# Patient Record
Sex: Male | Born: 1945
Health system: Southern US, Community
[De-identification: ages and names within clinical notes are randomized; demographics above are authoritative.]

## PROBLEM LIST (undated history)

## (undated) DIAGNOSIS — K219 Gastro-esophageal reflux disease without esophagitis: Secondary | ICD-10-CM

## (undated) DIAGNOSIS — G4700 Insomnia, unspecified: Secondary | ICD-10-CM

## (undated) DIAGNOSIS — T8859XA Other complications of anesthesia, initial encounter: Secondary | ICD-10-CM

## (undated) DIAGNOSIS — I2510 Atherosclerotic heart disease of native coronary artery without angina pectoris: Secondary | ICD-10-CM

## (undated) DIAGNOSIS — T4145XA Adverse effect of unspecified anesthetic, initial encounter: Secondary | ICD-10-CM

## (undated) DIAGNOSIS — IMO0002 Reserved for concepts with insufficient information to code with codable children: Secondary | ICD-10-CM

## (undated) DIAGNOSIS — J45909 Unspecified asthma, uncomplicated: Secondary | ICD-10-CM

## (undated) DIAGNOSIS — M1990 Unspecified osteoarthritis, unspecified site: Secondary | ICD-10-CM

## (undated) DIAGNOSIS — E119 Type 2 diabetes mellitus without complications: Secondary | ICD-10-CM

## (undated) DIAGNOSIS — R29898 Other symptoms and signs involving the musculoskeletal system: Secondary | ICD-10-CM

## (undated) DIAGNOSIS — D369 Benign neoplasm, unspecified site: Secondary | ICD-10-CM

## (undated) DIAGNOSIS — T884XXA Failed or difficult intubation, initial encounter: Secondary | ICD-10-CM

## (undated) DIAGNOSIS — R7303 Prediabetes: Secondary | ICD-10-CM

## (undated) DIAGNOSIS — C4491 Basal cell carcinoma of skin, unspecified: Secondary | ICD-10-CM

## (undated) DIAGNOSIS — T7840XA Allergy, unspecified, initial encounter: Secondary | ICD-10-CM

## (undated) DIAGNOSIS — I10 Essential (primary) hypertension: Secondary | ICD-10-CM

## (undated) DIAGNOSIS — E7800 Pure hypercholesterolemia, unspecified: Secondary | ICD-10-CM

## (undated) HISTORY — DX: Unspecified osteoarthritis, unspecified site: M19.90

## (undated) HISTORY — PX: BASAL CELL CARCINOMA EXCISION: SHX1214

## (undated) HISTORY — DX: Allergy, unspecified, initial encounter: T78.40XA

## (undated) HISTORY — PX: CARDIAC CATHETERIZATION: SHX172

## (undated) HISTORY — PX: COLONOSCOPY: SHX174

## (undated) HISTORY — PX: SQUAMOUS CELL CARCINOMA EXCISION: SHX2433

## (undated) HISTORY — PX: JOINT REPLACEMENT: SHX530

## (undated) HISTORY — DX: Benign neoplasm, unspecified site: D36.9

## (undated) HISTORY — PX: EYE SURGERY: SHX253

## (undated) HISTORY — PX: KNEE ARTHROSCOPY: SHX127

## (undated) HISTORY — DX: Atherosclerotic heart disease of native coronary artery without angina pectoris: I25.10

---

## 1997-10-30 HISTORY — PX: SALIVARY GLAND SURGERY: SHX768

## 1998-08-24 ENCOUNTER — Encounter: Admission: RE | Admit: 1998-08-24 | Discharge: 1998-08-24 | Payer: Self-pay | Admitting: Sports Medicine

## 1998-08-27 ENCOUNTER — Encounter: Admission: RE | Admit: 1998-08-27 | Discharge: 1998-08-27 | Payer: Self-pay | Admitting: Sports Medicine

## 1998-11-08 ENCOUNTER — Encounter: Admission: RE | Admit: 1998-11-08 | Discharge: 1998-11-08 | Payer: Self-pay | Admitting: Sports Medicine

## 1999-07-01 HISTORY — PX: BLEPHAROPLASTY: SUR158

## 2001-05-14 ENCOUNTER — Encounter: Payer: Self-pay | Admitting: *Deleted

## 2001-05-14 ENCOUNTER — Ambulatory Visit (HOSPITAL_COMMUNITY): Admission: RE | Admit: 2001-05-14 | Discharge: 2001-05-14 | Payer: Self-pay | Admitting: *Deleted

## 2001-06-03 ENCOUNTER — Inpatient Hospital Stay (HOSPITAL_COMMUNITY): Admission: RE | Admit: 2001-06-03 | Discharge: 2001-06-07 | Payer: Self-pay | Admitting: Orthopedic Surgery

## 2001-06-03 ENCOUNTER — Encounter: Payer: Self-pay | Admitting: Orthopedic Surgery

## 2002-10-30 HISTORY — PX: TOTAL HIP ARTHROPLASTY: SHX124

## 2004-10-30 DIAGNOSIS — D369 Benign neoplasm, unspecified site: Secondary | ICD-10-CM

## 2004-10-30 HISTORY — DX: Benign neoplasm, unspecified site: D36.9

## 2005-06-30 ENCOUNTER — Ambulatory Visit: Payer: Self-pay | Admitting: Sports Medicine

## 2005-07-05 ENCOUNTER — Encounter (INDEPENDENT_AMBULATORY_CARE_PROVIDER_SITE_OTHER): Payer: Self-pay | Admitting: Specialist

## 2005-07-05 ENCOUNTER — Ambulatory Visit: Payer: Self-pay | Admitting: Internal Medicine

## 2006-01-30 ENCOUNTER — Ambulatory Visit: Payer: Self-pay | Admitting: Cardiology

## 2006-01-31 ENCOUNTER — Ambulatory Visit: Payer: Self-pay | Admitting: Cardiology

## 2006-02-01 ENCOUNTER — Ambulatory Visit (HOSPITAL_COMMUNITY): Admission: RE | Admit: 2006-02-01 | Discharge: 2006-02-01 | Payer: Self-pay | Admitting: Cardiology

## 2006-02-01 ENCOUNTER — Ambulatory Visit: Payer: Self-pay | Admitting: Cardiology

## 2006-02-07 ENCOUNTER — Ambulatory Visit: Payer: Self-pay | Admitting: Cardiology

## 2006-03-30 ENCOUNTER — Ambulatory Visit: Payer: Self-pay | Admitting: Sports Medicine

## 2007-03-13 ENCOUNTER — Ambulatory Visit: Payer: Self-pay | Admitting: Cardiology

## 2009-08-27 ENCOUNTER — Encounter: Payer: Self-pay | Admitting: Cardiology

## 2009-10-02 ENCOUNTER — Encounter: Payer: Self-pay | Admitting: Cardiology

## 2009-10-15 DIAGNOSIS — I2510 Atherosclerotic heart disease of native coronary artery without angina pectoris: Secondary | ICD-10-CM | POA: Insufficient documentation

## 2009-10-15 DIAGNOSIS — E785 Hyperlipidemia, unspecified: Secondary | ICD-10-CM | POA: Insufficient documentation

## 2009-10-15 DIAGNOSIS — E119 Type 2 diabetes mellitus without complications: Secondary | ICD-10-CM | POA: Insufficient documentation

## 2009-10-19 ENCOUNTER — Ambulatory Visit: Payer: Self-pay | Admitting: Cardiology

## 2009-10-19 DIAGNOSIS — I10 Essential (primary) hypertension: Secondary | ICD-10-CM | POA: Insufficient documentation

## 2010-06-07 ENCOUNTER — Encounter (INDEPENDENT_AMBULATORY_CARE_PROVIDER_SITE_OTHER): Payer: Self-pay | Admitting: *Deleted

## 2010-07-18 ENCOUNTER — Ambulatory Visit: Payer: Self-pay | Admitting: Family Medicine

## 2010-07-18 ENCOUNTER — Inpatient Hospital Stay (HOSPITAL_COMMUNITY): Admission: EM | Admit: 2010-07-18 | Discharge: 2010-07-19 | Payer: Self-pay | Admitting: Emergency Medicine

## 2010-07-25 ENCOUNTER — Encounter (INDEPENDENT_AMBULATORY_CARE_PROVIDER_SITE_OTHER): Payer: Self-pay | Admitting: *Deleted

## 2010-07-28 ENCOUNTER — Ambulatory Visit: Payer: Self-pay | Admitting: Internal Medicine

## 2010-08-04 ENCOUNTER — Ambulatory Visit: Payer: Self-pay | Admitting: Internal Medicine

## 2010-10-19 ENCOUNTER — Ambulatory Visit
Admission: RE | Admit: 2010-10-19 | Discharge: 2010-10-19 | Payer: Self-pay | Source: Home / Self Care | Attending: Orthopedic Surgery | Admitting: Orthopedic Surgery

## 2010-11-29 NOTE — Procedures (Signed)
Summary: Colonoscopy  Patient: [name] Note: All result statuses are Final unless otherwise noted.  Tests: (1) Colonoscopy (COL)   COL Colonoscopy           DONE     [facility]     [address]     [place], [place]  [address]           COLONOSCOPY PROCEDURE REPORT           PATIENT:  Kyle [name], Kyle [name]  MR#:  [id number]     BIRTHDATE:  06-10-1946, 64 yrs. old  GENDER:  male     ENDOSCOPIST:  Hedwig Morton. Juanda Chance, MD     REF. BY:  Lesle Roderic, M.D.     PROCEDURE DATE:  08/04/2010     PROCEDURE:  Colonoscopy 19147     ASA CLASS:  Class I     INDICATIONS:  Elevated Risk Screening tubular adenoma in 2006     MEDICATIONS:   Versed 10 mg, Fentanyl 100 mcg           DESCRIPTION OF PROCEDURE:   After the risks benefits and     alternatives of the procedure were thoroughly explained, informed     consent was obtained.  Digital rectal exam was performed and     revealed no rectal masses.   The  endoscope was introduced through     the anus and advanced to the cecum, which was identified by both     the appendix and ileocecal valve, without limitations.  The     quality of the prep was good, using MiraLax.  The instrument was     then slowly withdrawn as the colon was fully examined.     <<PROCEDUREIMAGES>>           FINDINGS:  No polyps or cancers were seen (see image1, image2,     image3, and image4).   Retroflexed views in the rectum revealed no     abnormalities.    The scope was then withdrawn from the patient     and the procedure completed.           COMPLICATIONS:  None     ENDOSCOPIC IMPRESSION:     1) No polyps or cancers     2) Normal colonoscopy     RECOMMENDATIONS:     1) high fiber diet     REPEAT EXAM:  In 10 year(s) for.           ______________________________     Hedwig Morton. Juanda Chance, MD           CC:           n.     eSIGNED:   Hedwig Morton. Brodie at 08/04/2010 09:13 AM           Robert Bellow, [phone]  Note: An exclamation mark (!) indicates a result that was  not dispersed into the flowsheet. Document Creation Date: 08/04/2010 9:14 AM _______________________________________________________________________  (1) Order result status: Final Collection or observation date-time: 08/04/2010 09:06 Requested date-time:  Receipt date-time:  Reported date-time:  Referring Physician:   Ordering Physician: Lina Sar [phone]) Specimen Source:  Source: Launa Grill Order Number: [id number] Lab site:   Appended Document: Colonoscopy    Clinical Lists Changes  Observations: Added new observation of COLONNXTDUE: 07/2020 (08/04/2010 10:28)

## 2010-11-29 NOTE — Letter (Signed)
Summary: Miralax Instructions  [facility]  [address]   [place], [place] [address]   Phone: [phone]  Fax: [phone]       Kyle [name]    08/09/1946    MRN: [id number]       Procedure Day /Date: Thursday, 08-04-10     Arrival Time: 7:30 a.m.     Procedure Time: 8:30 a.m.     Location of Procedure:                     x  [facility] (4th Floor)   PREPARATION FOR COLONOSCOPY WITH MIRALAX  Starting 5 days prior to your procedure 07-30-10 do not eat nuts, seeds, popcorn, corn, beans, peas,  salads, or any raw vegetables.  Do not take any fiber supplements (e.g. Metamucil, Citrucel, and Benefiber). ____________________________________________________________________________________________________   THE DAY BEFORE YOUR PROCEDURE         DATE:08-03-10  DAY: Wednesday  1   Drink clear liquids the entire day-NO SOLID FOOD  2   Do not drink anything colored red or purple.  Avoid juices with pulp.  No orange juice.  3   Drink at least 64 oz. (8 glasses) of fluid/clear liquids during the day to prevent dehydration and help the prep work efficiently.  CLEAR LIQUIDS INCLUDE: Water Jello Ice Popsicles Tea (sugar ok, no milk/cream) Powdered fruit flavored drinks Coffee (sugar ok, no milk/cream) Gatorade Juice: apple, white grape, white cranberry  Lemonade Clear bullion, consomm, broth Carbonated beverages (any kind) Strained chicken noodle soup Hard Candy  4   Mix the entire bottle of Miralax with 64 oz. of Gatorade/Powerade in the morning and put in the refrigerator to chill.  5   At 3:00 pm take 2 Dulcolax/Bisacodyl tablets.  6   At 4:30 pm take one Reglan/Metoclopramide tablet.  7  Starting at 5:00 pm drink one 8 oz glass of the Miralax mixture every 15-20 minutes until you have finished drinking the entire 64 oz.  You should finish drinking prep around 7:30 or 8:00 pm.  8   If you are nauseated, you may take the 2nd Reglan/Metoclopramide  tablet at 6:30 pm.        9    At 8:00 pm take 2 more DULCOLAX/Bisacodyl tablets.  THE DAY OF YOUR PROCEDURE      DATE: 08-04-10 DAY: Thursday  You may drink clear liquids until  6:30 a.m.  (2 HOURS BEFORE PROCEDURE).   MEDICATION INSTRUCTIONS  Unless otherwise instructed, you should take regular prescription medications with a small sip of water as early as possible the morning of your procedure.           OTHER INSTRUCTIONS  You will need a responsible adult at least 64 years of age to accompany you and drive you home.   This person must remain in the waiting room during your procedure.  Wear loose fitting clothing that is easily removed.  Leave jewelry and other valuables at home.  However, you may wish to bring a book to read or an iPod/MP3 player to listen to music as you wait for your procedure to start.  Remove all body piercing jewelry and leave at home.  Total time from sign-in until discharge is approximately 2-3 hours.  You should go home directly after your procedure and rest.  You can resume normal activities the day after your procedure.  The day of your procedure you should not:   Drive   Make legal decisions  Operate machinery   Drink alcohol   Return to work  You will receive specific instructions about eating, activities and medications before you leave.   The above instructions have been reviewed and explained to me by   Ezra Sites RN  July 28, 2010 8:30 AM     I fully understand and can verbalize these instructions _____________________________ Date _______

## 2010-11-29 NOTE — Miscellaneous (Signed)
Summary: [facility]  Clinical Lists Changes  Medications: Added new medication of MIRALAX   POWD (POLYETHYLENE GLYCOL 3350) As per prep  instructions. - Signed Added new medication of REGLAN 10 MG  TABS (METOCLOPRAMIDE HCL) As per prep instructions. - Signed Added new medication of DULCOLAX 5 MG  TBEC (BISACODYL) Day before procedure take 2 at 3pm and 2 at 8pm. - Signed Rx of MIRALAX   POWD (POLYETHYLENE GLYCOL 3350) As per prep  instructions.;  #255gm x 0;  Signed;  Entered by: Ezra Sites RN;  Authorized by: Hart Carwin MD;  Method used: Electronically to [facility]*, [address], [place], [place]  [address], Ph: [phone], Fax: [phone] Rx of REGLAN 10 MG  TABS (METOCLOPRAMIDE HCL) As per prep instructions.;  #2 x 0;  Signed;  Entered by: Ezra Sites RN;  Authorized by: Hart Carwin MD;  Method used: Electronically to [facility]*, [address] [place], [place]  [address], Ph: [phone], Fax: [phone] Rx of DULCOLAX 5 MG  TBEC (BISACODYL) Day before procedure take 2 at 3pm and 2 at 8pm.;  #4 x 0;  Signed;  Entered by: Ezra Sites RN;  Authorized by: Hart Carwin MD;  Method used: Electronically to [facility]*, [address], [place], [place]  [id number], Ph: [phone], Fax: [phone] Observations: Added new observation of ALLERGY REV: Done (07/28/2010 8:07)    Prescriptions: DULCOLAX 5 MG  TBEC (BISACODYL) Day before procedure take 2 at 3pm and 2 at 8pm.  #4 x 0   Entered by:   Ezra Sites RN   Authorized by:   Hart Carwin MD   Signed by:   Ezra Sites RN on 07/28/2010   Method used:   Electronically to        [facility]* (retail)       [address]       [place], [place]  [id number]       Ph: [phone]       Fax: [phone]   RxID:   [id number] REGLAN 10 MG  TABS (METOCLOPRAMIDE HCL) As per prep instructions.  #2 x 0   Entered by:   Ezra Sites RN   Authorized by:   Hart Carwin MD  Signed by:   Ezra Sites RN on 07/28/2010   Method used:   Electronically to        [facility]* (retail)       [address]       [place], [place]  [address]       Ph: [phone]       Fax: [phone]   RxID:   [id number] MIRALAX   POWD (POLYETHYLENE GLYCOL 3350) As per prep  instructions.  #255gm x 0   Entered by:   Ezra Sites RN   Authorized by:   Hart Carwin MD   Signed by:   Ezra Sites RN on 07/28/2010   Method used:   Electronically to        [facility]* (retail)       [address]       [place], [place]  [address]       Ph: [phone]       Fax: [phone]   RxID:   [id number]

## 2010-11-29 NOTE — Letter (Signed)
Summary: Colonoscopy Letter  [facility]  [name] [place], [place] [address]   Phone: [phone]  Fax: [phone]      June 07, 2010 MRN: [id number]   [name] [address] [place], [place]  [address]   Dear Mr. Kyle [name],   According to your medical record, it is time for you to schedule a Colonoscopy. The American Cancer Society recommends this procedure as a method to detect early colon cancer. Patients with a family history of colon cancer, or a personal history of colon polyps or inflammatory bowel disease are at increased risk.  This letter has beeen generated based on the recommendations made at the time of your procedure. If you feel that in your particular situation this may no longer apply, please contact our office.  Please call our office at [phone] to schedule this appointment or to update your records at your earliest convenience.  Thank you for cooperating with [place] to provide you with the very best care possible.   Sincerely,  Hedwig Morton. Juanda Chance, M.D.  [facility] [phone]

## 2011-01-09 LAB — POCT I-STAT, CHEM 8
BUN: 12 mg/dL (ref 6–23)
Calcium, Ion: 1.17 mmol/L (ref 1.12–1.32)
Chloride: 100 mEq/L (ref 96–112)
Creatinine, Ser: 0.8 mg/dL (ref 0.4–1.5)
Glucose, Bld: 137 mg/dL — ABNORMAL HIGH (ref 70–99)
HCT: 42 % (ref 39.0–52.0)
Hemoglobin: 14.3 g/dL (ref 13.0–17.0)
Potassium: 4.1 mEq/L (ref 3.5–5.1)
Sodium: 135 mEq/L (ref 135–145)
TCO2: 27 mmol/L (ref 0–100)

## 2011-01-12 LAB — CBC
HCT: 38.8 % — ABNORMAL LOW (ref 39.0–52.0)
Hemoglobin: 13.3 g/dL (ref 13.0–17.0)
MCHC: 34.3 g/dL (ref 30.0–36.0)

## 2011-01-12 LAB — URINE CULTURE

## 2011-01-12 LAB — COMPREHENSIVE METABOLIC PANEL
ALT: 46 U/L (ref 0–53)
AST: 38 U/L — ABNORMAL HIGH (ref 0–37)
Alkaline Phosphatase: 70 U/L (ref 39–117)
CO2: 27 mEq/L (ref 19–32)
Chloride: 95 mEq/L — ABNORMAL LOW (ref 96–112)
Creatinine, Ser: 0.85 mg/dL (ref 0.4–1.5)
GFR calc Af Amer: >60 mL/min (ref >60)
GFR calc non Af Amer: >60 mL/min (ref >60)
Potassium: 3.9 mEq/L (ref 3.5–5.1)
Sodium: 130 mEq/L — ABNORMAL LOW (ref 135–145)
Total Bilirubin: 0.6 mg/dL (ref 0.3–1.2)

## 2011-01-12 LAB — DIFFERENTIAL
Basophils Absolute: 0 10*3/uL (ref 0.0–0.1)
Basophils Relative: 0 % (ref 0–1)
Eosinophils Absolute: 0 10*3/uL (ref 0.0–0.7)
Eosinophils Relative: 0 % (ref 0–5)

## 2011-01-12 LAB — URINALYSIS, ROUTINE W REFLEX MICROSCOPIC
Glucose, UA: NEGATIVE mg/dL
Specific Gravity, Urine: 1.013 (ref 1.005–1.030)
pH: 6.5 (ref 5.0–8.0)

## 2011-01-12 LAB — URINE MICROSCOPIC-ADD ON

## 2011-01-12 LAB — CULTURE, BLOOD (ROUTINE X 2): Culture  Setup Time: 201109200959

## 2011-02-28 ENCOUNTER — Other Ambulatory Visit: Payer: Self-pay | Admitting: Family Medicine

## 2011-03-14 NOTE — Assessment & Plan Note (Signed)
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 Nonobstructive coronary disease.  Catheterization February 01, 2006.      He is having no angina or ischemia.  He is very active exercising.      He has normal left ventricular systolic function.  2. Hyperlipidemia.  Recent lipids:  Total cholesterol 119,      triglycerides of 40, HDL 56, LDL 55 on Zetia 10, Lipitor 20.  We      are being very aggressive with this.  3. Diet-controlled type 2 diabetes.  Hemoglobin A1c 6.1%.  Fasting      blood sugar 121 on recent blood panel.   Other than some orthopedic issues, Kyle [name] has had a good year.  He is  getting back into a regular exercise program.   His medications are:  1. Baby aspirin 81 mg per day.  2. Nasacort.  3. Lipitor 20 mg a day.  4. Uroxatral 2 times a week.  5. Zetia 10 mg a day.   His blood pressure today is 124/78.  His pulse 65 and regular.  His  weight is 209, that is down 6.  HEENT:  Normocephalic and atraumatic.  PERRLA.  Extraocular movements  intact.  Sclerae are clear.  Facial symmetry is normal.  Carotid upstrokes are equal bilaterally without bruits.  No JVD.  Thyroid is not enlarged.  LUNGS:  Clear to auscultation.  HEART:  Reveals a nondisplaced PMI.  Normal S1 and S2.  Soft systolic  sound across the left sternal border.  S2 splits physiologically.  ABDOMINAL EXAM:  Soft with good bowel sounds.  No midline bruit.  There  is no hepatomegaly.  EXTREMITIES:  No edema.  Pulses are intact.  NEUROLOGIC:  Exam is intact.   He was worried about perhaps a pulsatile aorta that his partner, Dr.  Cleta [name], felt.  Looking back, he had a CT scan January 25, 2006 that showed  some calcification in the aorta, but no  dilatation.   EKG shows normal sinus rhythm with no acute changes.   ASSESSMENT AND PLAN:  I have had a nice chat with Kyle [name] today.  I have  made no changes in his program.  I have urged him to lose an additional  8 to 10 pounds.  If we can normalize his hemoglobin A1c, I think we are  doing all we can to prevent future progression of atherosclerosis.  We  discussed at length the recent controversy concerning Zetia.  I have  asked him to stay on the combination for now.  We will have outcome  studies in the next year to 2.   I will see him back in a year.     Kyle C. Daleen Squibb, MD, [facility]  Electronically Signed    TCW/MedQ  DD: 03/13/2007  DT: 03/13/2007  Job #: [id number]   cc:   Kyle [name]. Kyle [name], M.D.

## 2011-03-17 NOTE — H&P (Signed)
[place]  Patient:    Kyle [name], Kyle [name]                       MRN: [id number] Adm. Date:  06/03/01 Attending:  Carlisle Beers. Dorothyann Gibbs, M.D. Dictator:   Arnoldo Morale, P.A.                         History and Physical  DATE OF BIRTH:  [birth year]  CHIEF COMPLAINT:  Four-year history of progressively worsening left hip pain.  HISTORY OF PRESENT ILLNESS:  This 65 year old white male patient presents to Dr. Priscille Kluver with a four-year history of fairly sudden onset but progressively worsening left hip pain.  He has no known injury or prior surgery on his hip but was very active in sports while growing up.  The pain pretty much is almost constant at this time and is located in his groin and trochanter area with radiation into his buttock and low back.  It is described as an ache or pressure that becomes sharp at times in the groin.  The pain increases with any activity and decreases when he rests.  The hip does feel like it locks at times and it keeps him awake at night.  The hip also pops and grinds at times. He has tried Celebrex, Aleve, and Tylenol with some relief of the pain.  He denies any low back pain or paresthesias and is not currently using any assistive devices for walking.  He does currently take some Ativan at night to help him sleep.  ALLERGIES:  CEFTIN and PENICILLIN caused anaphylaxis.  CURRENT MEDICATIONS:  1. Enteric-coated aspirin 162 mg one-half tablet p.o. q.d.  2. Multivitamin one tablet p.o. q.d.  3. Claritin 10 mg one tablet p.o. q.d. p.r.n.  4. Allegra 180 mg one tablet p.o. q.d. p.r.n.  5. Albuterol inhaler one to two puffs inhaled three to four times a week.  6. Flovent inhaler one to two puffs inhaled q.d.  7. Nasacort spray used p.r.n.  8. Pravachol 20 mg one tablet p.o. q.d.  9. Ativan 1 mg one-half tablet p.o. q.h.s. p.r.n. insomnia. 10. Allergy shots.  PAST MEDICAL HISTORY:  He has a long history of mild asthma which  is treated by Dr. Fannie Knee.  He also has severe allergies for which he does get treated with the allergy shots and Allegra or Claritin.  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  He denies any history of diabetes mellitus, hypertension, thyroid disease, hiatal hernia, peptic ulcer disease, heart disease, or any other chronic medical condition other than noted previously.  PAST SURGICAL HISTORY:  Excision of a salivary gland and stone by Dr. Ermalinda Barrios in 1999.  SOCIAL HISTORY:  He denies any history of cigarette smoking or drug use.  He does drink about a beer a day.  He is married and has two children.  He currently lives with his wife in a two-story house with four steps into the main entrance.  He is a Proofreader.  His medical doctor is ______ and his phone number is [phone].  FAMILY HISTORY:  His mother died at age 40 with a history of osteoarthritis and cardiac arrest.  His father died at age 19 with a myocardial infarction. He has two sisters - one age 48 with coronary artery disease, one age 13 who is alive and well.  He has two daughters age 63 and  29 and the 24 year old does have allergies.  REVIEW OF SYSTEMS:  He does complain of multilevel foraminal narrowing of his cervical spine due to arthritis.  He has had a recent MRI that confirms that. He does have some problems with flow/urinary stream.  He has a history of his left shoulder subluxing in the past.  He does wear glasses.  He has a Living Will and his power-of-attorney is Dennies Coate.  All other systems are negative and noncontributory at this time. All other systems are negative and noncontributory at this time.  PHYSICAL EXAMINATION:  GENERAL:  Well-developed, well-nourished white male in no acute distress. Walks with a slight limp on the left.  Mood and affect are appropriate.  Talks easily with the examiner.  Height 6 feet 5 inches, weight 205 pounds.  VITAL SIGNS:  Temperature 98.4 degrees  Fahrenheit, pulse 68, respirations 12, and blood pressure 122/76.  HEENT:  Normocephalic, atraumatic, without frontal or maxillary sinus tenderness to palpation.  Conjunctivae pink, sclerae anicteric.  PERRLA.  EOMs intact.  Funduscopic exam shows visible red reflex with normal retinal vasculature, optic disk, and cup.  No visible external ear deformities. Hearing grossly intact.  Tympanic membranes pearly gray bilaterally with  good light reflex.  Nose and nasal septum midline.  Nasal mucosa pink and moist without exudates or polyps noted.  Buccal mucosa pink and moist.  Good dentition.  Pharynx without erythema or exudates.  Tongue and uvula midline. Tongue without vesiculations and uvula rises equally with phonation.  NECK:  No visible masses or lesions noted.  No palpable thyromegaly nor lymphadenopathy.  Trachea midline.  Full range of motion and nontender to palpation along the cervical spine.  Carotids +2 bilaterally without bruits.  CARDIOVASCULAR:  Heart rate and rhythm regular.  S1, S2 present without rubs, clicks, or murmurs noted.  RESPIRATORY:  Respirations even and unlabored.  Breath sounds clear to auscultation bilaterally without rales or wheezes noted.  ABDOMEN:  Rounded abdominal contour.  Bowel sounds present x 4 quadrants. Soft, nontender to palpation, without hepatosplenomegaly nor CVA tenderness. Femoral pulses are +2 bilaterally.  Nontender to palpation along the entire length of the vertebral column.  BREAST/GENITOURINARY/RECTAL:  These exams deferred at this time.  MUSCULOSKELETAL:  No obvious deformities bilaterally upper extremities with full range of motion of these extremities without pain.  Radial pulses are +2 bilaterally.  He has full range of motion of his right hip, bilateral knees, ankles, and toes.  DP and PT pulses are +2.  He is able to flex the left hip to about 90-100 degrees but this does cause pain.  The hip also goes into external rotation  with flexion.  He has no active abduction of the hip.  He does have full extension, but internal and external rotation are painful and  minimal when compared to the other side.  NEUROLOGIC:  Alert and oriented x 3.  Cranial nerves 2-12 are grossly intact. Strength 5/5 bilateral upper and lower extremities.  Rapid alternating movements intact.  Sensation intact to light touch.  Deep tendon reflexes are 2+ bilateral upper and lower extremities.  RADIOLOGIC FINDINGS:  X-rays taken of his pelvis and left hip in April 2002 show end-stage osteoarthritis, bone-against-bone on the lateral weightbearing area of the left hip.  Right hip shows severe osteoarthritis narrowing of the joints and space that is almost as bad as the left side.  IMPRESSION: 1. Progressive osteoarthritis bilateral hips, left worse than right. 2. Asthma. 3. Severe allergies. 4. Osteoarthritis  of the cervical spine.  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 Jonny Ruiz L. Rendall. He will undergo all the routine preoperative laboratory tests and studies prior to this procedure.  If he has any medical problems while he is hospitalized we will contact ______. DD:  06/05/01 TD:  06/05/01 Job: [id number]

## 2011-03-17 NOTE — Discharge Summary (Signed)
[place]. [facility]  Patient:    Kyle [name], Kyle [name] Visit Number: [id number] MRN: [id number]          Service Type: SUR Location: [id number] Attending Physician:  Carolan Shiver Ii Dictated by:   Jamelle Rushing, P.A.C. Admit Date:  06/03/2001 Discharge Date: 06/07/2001                             Discharge Summary  ADMISSION DIAGNOSES: 1. Progressive osteoarthritis bilateral hips, left greater than right. 2. Asthma. 3. Severe allergies. 4. Osteoarthritis of cervical spine.  DISCHARGE DIAGNOSES: 1. Left total hip arthroplasty. 2. Osteoarthritis right hip. 3. Asthma. 4. Severe allergies. 5. Osteoarthritis of cervical spine.  HISTORY OF PRESENT ILLNESS:  The patient is a 65 year old white male who presents with a four-year history of sudden onset of progressively worsening left hip pain.  The patient denies any injuries or surgical procedures. The pain is constant, aching pressure which becomes sharp at times. It is located over the left groin trochanteric region and into the buttocks with some radiation up into the back.  It increases with range of motion and activities. It improves with rest.  The patient is currently taking Tylenol, Celebrex and Aleve p.r.n.  He does have night pain, popping, grinding.  He is not using any assistive devices.  ALLERGIES:  PENICILLIN, CEFTIN.  MEDICATIONS: 1. Aspirin 162 mg p.o. q.d. 2. Multivitamin p.o. q.d. 3. Claritin 10 mg p.o. q.d. p.r.n. 4. Allegra 180 mg p.o. q.d. p.r.n. 5. Albuterol inhaler p.r.n. 6. Flovent one or two puffs q.d. 7. Nasacort p.r.n. 8. Ativan 1 mg q.h.s.  SURGICAL REPORT:  On June 03, 2001, the patient was taken to the OR by Jonny Ruiz L. Dorothyann Gibbs, M.D., assisted by Georgena Spurling, M.D., and Jamelle Rushing, P.A.C.  Under spinal sedation, the patient had a left AML hip replacement performed.  The patient tolerated the procedure well.  Operative time was one hour five minutes,   Estimated blood loss was 350 to 400 cc.  One Hemovac drain was left in place.  The patient was transferred to the recovery room in good condition with the leg lengths approximately equal.  CONSULTS:  On June 03, 2001, the following routine consults were requested: physical therapy, occupational therapy, rehab, and care management.  HOSPITAL COURSE:  On June 03, 2001, the patient was admitted to [facility]. [facility] under the care of Dr. Jonny Ruiz L. Rendall.  The patient was taken to the OR where a left total hip arthroplasty was performed.  The patient tolerated the procedure well and was transferred to the recovery room and then to the orthopedic floor where he incurred a five-day postoperative course in which there were no insignificant untoward events.  The patients vital signs remained stable.  He remained afebrile.  He progressed very rapidly and nicely with physical therapy.  His pain was well controlled on OxyContin CR and IR after 24 hours being on PCA.  The patients wounds remained benign for any signs of infection.  Leg remained neuromotor vascularly intact and it was felt that on postop day #5 he was ready to be discharged home and he was discharged home in good condition.  LABS:  CBC on June 06, 2001, showed wbc 10.0, hemoglobin 9.5, hematocrit 27.1, platelets 233.  Chemistries on June 05, 2001, showed sodium 128, potassium 3.9, glucose 128, BUN 5, creatinine 0.8.  Urinalysis on admission was normal.  The patient received one unit of packed red blood cells during hospitalization.  MEDICATIONS ON DISCHARGE FROM ORTHO FLOOR:  1. Colace 100 mg p.o. b.i.d.  2. Arixtra 2.5 mg subcu q.d. for a total of seven days.  3. Flovent one or two puffs q.d.  4. Ativan 2 mg p.o. q.h.s.  5. Two beers q.d.  6. OxyContin CR 10 mg q.12h.  7. Celebrex 200 mg p.o. b.i.d.  8. Dulcolax 10 mg p.r. p.r.n.  9. Tylenol 650 mg p.o. q.4h. p.r.n. 10. Robaxin 500 mg p.o. q.6-8h. p.r.n. 11.  Albuterol one puff p.r.n. 12. Claritin 10 mg p.o. q.d. p.r.n. 13. Nasacort two sprays q.d. p.r.n. 14. Percocet 5 mg one or two tablets every four to six hours p.r.n. 15. Pepcid 20 mg p.o. p.r.n.  DISCHARGE INSTRUCTIONS: 1. Dulcolax suppositories one every day as needed for constipation. 2. OxyContin CR 10 mg one tablet every 12 hours. 3. Percocet 5 mg one or two tablets every four to six hours p.r.n. pain. 4. Arixtra 2.5 mg subcu for three more days. 5. Activity as per physical therapy instructions. 6. Diet:  No restrictions. 7. Wound care:  Change dressing daily as instructed by Dr. Priscille Kluver. 8. Follow-up: Call Dr. Oretha Ellis office for an appointment.  CONDITION ON DISCHARGE HOME:  Good. Dictated by:   Jamelle Rushing, P.A.C. Attending Physician:  Carolan Shiver Ii DD:  07/25/01 TD:  07/25/01 Job: [id number]

## 2011-03-17 NOTE — Cardiovascular Report (Signed)
[place]. [facility]  Patient:    [name], Kyle                        MRN: [id number] Proc. Date: 05/14/01 Adm. Date:  [id number] Attending:  Daisey Must CC:         Rutha Bouchard, M.D.  Cardiac Catheterization Laboratory   Cardiac Catheterization  PROCEDURES PERFORMED:  Left heart catheterization with coronary angiography, left ventriculography, and abdominal aortography.  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  He recently underwent electron beam computed tomography, which showed a calcium score which placed him at intermediate risk for coronary artery disease.  He has significant osteoarthritis and is scheduled for a left hip arthroplasty.  We, therefore, opted to proceed with cardiac catheterization based on the above to rule out significant coronary artery disease.  PROCEDURAL NOTE:  A 6-French sheath was placed in the right femoral artery. Standard Judkins 6-French catheters were utilized.  Contrast was Omnipaque. There were no complications.  RESULTS:  HEMODYNAMICS:  Left ventricular pressure 126/18.  Aortic pressure 126/80. There was no aortic valve gradient.  LEFT VENTRICULOGRAM:  Wall motion is normal.  Ejection fraction is calculated at 68%.  There is no mitral regurgitation.  CORONARY ARTERIOGRAPHY (RIGHT DOMINANT): The left main has minor luminal irregularities.  The left anterior descending artery has mild calcification of the proximal and mid vessel with a diffuse 25% stenosis extending from the proximal to mid vessel.  The LAD gives rise to a large branching single diagonal.  The left circumflex is a relatively small vessel.  It gives rise to a normal sized branching first obtuse marginal, a small second obtuse marginal, and a small third obtuse marginal.  The second obtuse marginal has a 25% stenosis at its origin.  The right coronary artery is a large dominant  vessel.  In the proximal portion of the mid vessel is a discrete 30% stenosis.  Further down in the mid to distal vessel is a tubular 30% stenosis.  The distal right coronary artery gives rise to a normal sized posterior descending artery, a small first posterolateral branch, and a large second posterolateral branch.  ABDOMINAL AORTOGRAM:  Normal renal arteries with mild tortuosity of the abdominal aorta, but no atherosclerotic disease.  IMPRESSIONS: 1. Normal left ventricular systolic function. 2. Mild nonobstructive coronary artery disease.  RECOMMENDATIONS:  The patient will be continued on medical therapy with aggressive risk factor modification. DD:  05/14/01 TD:  05/14/01 Job: [id number]

## 2011-03-17 NOTE — Op Note (Signed)
[place]. [facility]  Patient:    RAZIEL, KOENIGS                        MRN: [id number] Proc. Date: 06/03/01 Adm. Date:  [id number] Attending:  Carolan Shiver Ii                           Operative Report  PREOPERATIVE DIAGNOSIS:  Osteoarthritis, left hip.  POSTOPERATIVE DIAGNOSIS:  Osteoarthritis, left hip.  OPERATION PERFORMED:  Left AML total hip replacement.  SURGEON:  John L. Rendall III, M.D.  ASSISTANT: 1. Georgena Spurling, M.D. 2. Jamelle Rushing, P.A.  ANESTHESIA:  Spinal with sedation.  PATHOLOGY:  Patient has bone against bone in the central weightbearing area with completely eburnated superior femoral head medially.  In addition, a large loose body was found in the inferior recess.  DESCRIPTION OF PROCEDURE:  Under spinal anesthesia, the patient was placed in the right lateral decubitus position and the hip was prepared with DuraPrep and draped as a sterile field.  A posterior approach was made splitting the iliotibial band in the line of its fibers.  The Charnley retractor was inserted.  The short external rotators and the hip capsule were taken down from bone with electrocautery.  A significant amount of bleeding capsular vessels were encountered causing well over 100 cc blood loss before they were finally controlled.   The hip capsule was then opened in a T-shaped manner. The hip was dislocated.  The superior femoral neck was exposed.  The IM initiator was used.  The canal finder was used.  The femoral canal was then successively reamed up to 16 to 16.5 mm before good Chamfer fit was felt in the canal. Consequently the canal was prepared for an 18 stem reaming to 17.5. The 15, 16.5 and 18 standard rasps were then used with a calcar reamer.  In so doing, a small linear defect occurred in the medial calcar but it only extended one quarter inch.  Excellent fit with the 18 standard rasp was encountered.  At this point the acetabulum was  exposed with three wing retractors superiorly and two cobra inferior.  The capsule was saved.  The labrum was excised.  The rim was exposed.  Remnants of the ligamentum teres and debris in the fovea were removed including bone chip found there.  The acetabulum was then successfully reamed from 50 up to 58.  A 58 gave an excellent rim fit and the acetabulum was deepened into bleeding bone but not going through the inner cortex.  At this point a trial fitting of a 58 cup was excellent and a 60 gave an excellent rim fit.  At this point the 60 trispike acetabulum was inserted and was immediately quite solid.  It should be noted that bone graft was packed in what was left of the central acetabular fovea. Once the acetabular component was in place, trialing of different  neck lengths and anteversions were done using a 32 ball and the XL stem with a +9 hip ball, gave the best fit, alignment and stability through full range of motion.  Permanent components were then put in place.  He had the Duraloc Marathon acetabular liner with a 10 degree lip for a 32 mm hip ball.  He had the 18 mm XL stem, large stature femoral component. He had the standard apex hole eliminator and the ____________ size 32+ 9  neck length.  These appeared to leave him with equal leg length.  At this point all components were inserted.  The hip was tested through full range of motion.  Dr. Sherlean Foot and I were both quite satisfied with the fit, alignment and stability.  The hip capsule was then closed with #1 Surgidac.  The iliotibial band was closed #1 Surgidac and subcu in two layers of #1 and 2-0 Vicryl.  OPERATIVE TIME:  One hour and five minutes.  ESTIMATED BLOOD LOSS:  350 to 400 cc.  Due to initial blood loss, decision was made to put in a single Hemovac drain.  The patient returned to recovery in good condition with leg lengths approximately equal. DD:  06/03/01 TD:  06/03/01 Job: [id number]

## 2011-03-17 NOTE — Cardiovascular Report (Signed)
NAME:  Kyle [name], Kyle [name]              ACCOUNT NO.:  [id number]   MEDICAL RECORD NO.:  [id number]          PATIENT TYPE:  OIB   LOCATION:  2899                         FACILITY:  [facility]   PHYSICIAN:  Arturo Morton. Riley Kill, M.D. 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 Gerri Spore.  He presents now with some  marked fatigability and some shortness of breath over the past few weeks.  He saw Dr. Daleen Squibb who recommended cardiac catheterization.  He was brought to  the laboratory for further evaluation.   PROCEDURE:  1.  Left heart catheterization.  2.  Selective coronary arteriography.  3.  Selective left ventriculography.   DESCRIPTION OF PROCEDURE:  The patient was brought to the catheterization  laboratory and prepped and draped in the usual fashion.  Through an anterior  puncture the right femoral artery was easily entered.  A 5-French sheath was  placed.  Following this, views of the left and right coronary arteries were  obtained in multiple angiographic projections.  Central aortic and left  ventricular pressures were measured with a pigtail.  Ventriculography was  then performed in the RAO projection.  The 5-French pigtail was kicked out  during ventricular injection but opacification was adequate to view the  ventricle.  Formal ejection fraction could not be calculated but when  compared to the previous study does not appear to be substantially changed.  EF appears to be in excess of 55%.   1.  The left main coronary artery is free of critical disease.  2.  The left anterior descending artery is calcified.  This is evident on      multiple views.  The LAD importantly has some mild irregularity      proximally and then opens up.  The vessel bifurcates distally into an      LAD and  diagonal.  It is fairly large in caliber throughout.  Overlying      the first septal perforator is an area of mild eccentric plaquing of      probably about 20%.  Calcification is noted throughout the proximal LAD.      The major diagonal branch as noted is free of critical disease as is the      distal LAD.  3.  The circumflex coronary artery consists of four branches.  There are two      proximal marginal branches.  The first marginal branch is a moderate      size vessel that courses out over the anterolateral wall.  In some views      there is up to about 30-40% narrowing eccentrically, although it does      not appear to be critical.  The second marginal branch is much smaller      in caliber and appears to be free of critical disease.  The third      marginal branch is small to moderate and free  of critical disease.  The      small posterolateral system is without significant narrowing.  4.  The right coronary artery is a very large caliber vessel.  There is some      mild eccentric plaquing of 20% proximally in the proximal portion of the      mid vessel and probably 30% to at most 40% in the distal portion of the      mid vessel.  The distal vessel consists of a small to moderate posterior      descending branch, a small first posterolateral, and very large second      posterolateral branch.  All of these appear to be free of critical      disease.   CONCLUSION:  1.  Preserved overall left ventricular function.  2.  Calcification of the left anterior descending artery.  3.  Scattered coronary irregularities as noted in the above text.   DISPOSITION:  It is not clear that the coronary anatomy provides an answer  for the cause of symptoms.  There are scattered lesions but they do not  appear to be hemodynamically significant.  Specifically, the LAD and right  are very large caliber vessels without critical stenoses.  The branches of  the circumflex are somewhat smaller.   Continued medical therapy would be  recommended.  I have communicated this to Dr. Daleen Squibb, who will follow the  patient.      Arturo Morton. Riley Kill, M.D. [facility]  Electronically Signed     TDS/MEDQ  D:  02/01/2006  T:  02/02/2006  Job:  [id number]   cc:   Thomas C. Wall, M.D.  [address]  [address]  [place]  [place] [address]   Cardiovascular Laboratory   [place]. Cleta Alberts, M.D.  Fax: [phone]

## 2011-11-08 ENCOUNTER — Ambulatory Visit (INDEPENDENT_AMBULATORY_CARE_PROVIDER_SITE_OTHER): Payer: 59

## 2011-11-08 DIAGNOSIS — J40 Bronchitis, not specified as acute or chronic: Secondary | ICD-10-CM

## 2011-11-08 DIAGNOSIS — R059 Cough, unspecified: Secondary | ICD-10-CM

## 2011-11-08 DIAGNOSIS — R05 Cough: Secondary | ICD-10-CM

## 2011-11-09 ENCOUNTER — Ambulatory Visit (INDEPENDENT_AMBULATORY_CARE_PROVIDER_SITE_OTHER): Payer: 59

## 2011-11-09 DIAGNOSIS — H9209 Otalgia, unspecified ear: Secondary | ICD-10-CM

## 2011-11-09 DIAGNOSIS — H902 Conductive hearing loss, unspecified: Secondary | ICD-10-CM

## 2011-11-09 DIAGNOSIS — H6120 Impacted cerumen, unspecified ear: Secondary | ICD-10-CM

## 2011-11-23 ENCOUNTER — Ambulatory Visit (INDEPENDENT_AMBULATORY_CARE_PROVIDER_SITE_OTHER): Payer: 59

## 2011-11-23 DIAGNOSIS — I10 Essential (primary) hypertension: Secondary | ICD-10-CM

## 2011-11-23 DIAGNOSIS — M542 Cervicalgia: Secondary | ICD-10-CM

## 2011-11-23 DIAGNOSIS — M159 Polyosteoarthritis, unspecified: Secondary | ICD-10-CM

## 2011-11-23 DIAGNOSIS — Z79899 Other long term (current) drug therapy: Secondary | ICD-10-CM

## 2011-11-30 ENCOUNTER — Ambulatory Visit (INDEPENDENT_AMBULATORY_CARE_PROVIDER_SITE_OTHER): Payer: 59 | Admitting: Emergency Medicine

## 2011-11-30 ENCOUNTER — Encounter: Payer: Self-pay | Admitting: Emergency Medicine

## 2011-11-30 VITALS — BP 125/69 | HR 67 | Temp 98.1°F | Resp 67 | Ht 76.5 in | Wt 215.4 lb

## 2011-11-30 DIAGNOSIS — R319 Hematuria, unspecified: Secondary | ICD-10-CM

## 2011-11-30 DIAGNOSIS — R5381 Other malaise: Secondary | ICD-10-CM

## 2011-11-30 DIAGNOSIS — R748 Abnormal levels of other serum enzymes: Secondary | ICD-10-CM

## 2011-11-30 DIAGNOSIS — R5383 Other fatigue: Secondary | ICD-10-CM

## 2011-11-30 DIAGNOSIS — M2550 Pain in unspecified joint: Secondary | ICD-10-CM

## 2011-11-30 LAB — POCT UA - MICROSCOPIC ONLY
WBC, Ur, HPF, POC: NEGATIVE
Yeast, UA: NEGATIVE

## 2011-11-30 LAB — POCT URINALYSIS DIPSTICK
Bilirubin, UA: NEGATIVE
Glucose, UA: NEGATIVE
Leukocytes, UA: NEGATIVE
Nitrite, UA: NEGATIVE
Urobilinogen, UA: 0.2

## 2011-11-30 NOTE — Patient Instructions (Addendum)
Wear splint on wrist and ankle. Decrease lipitor to one half tab per day. Decrease lipitor to a half tab a every other day

## 2011-11-30 NOTE — Progress Notes (Signed)
  Subjective:    Patient ID: Kyle [name], male    DOB: 07-30-1946, 66 y.o.   MRN: [id number]  HPIHere to follow up on joint pain. Having pain in right ankle. Muscles feel better since stopping lipitor. Cpk was elevated a t404.    Review of Systems  Constitutional: Negative.   HENT: Negative.   Eyes: Negative.   Respiratory: Negative.   Cardiovascular: Negative.   Musculoskeletal: Positive for myalgias, joint swelling and arthralgias.       Objective:   Physical Exam  Cardiovascular: Normal rate.   Musculoskeletal:       Right ankle: He exhibits swelling. tenderness.   Muscles sore. Tender over rt. wrist       Assessment & Plan:  Recent increase in joint pain. Feeling better off of lipitor

## 2011-12-20 ENCOUNTER — Other Ambulatory Visit: Payer: Self-pay | Admitting: Dermatology

## 2011-12-21 ENCOUNTER — Ambulatory Visit (INDEPENDENT_AMBULATORY_CARE_PROVIDER_SITE_OTHER): Payer: 59 | Admitting: Physician Assistant

## 2011-12-21 VITALS — BP 124/80 | HR 76 | Temp 98.1°F | Resp 16 | Ht 76.5 in | Wt 212.5 lb

## 2011-12-21 DIAGNOSIS — S61409A Unspecified open wound of unspecified hand, initial encounter: Secondary | ICD-10-CM

## 2011-12-21 DIAGNOSIS — M25549 Pain in joints of unspecified hand: Secondary | ICD-10-CM

## 2011-12-21 NOTE — Progress Notes (Signed)
  Subjective:    Patient ID: Kyle [name], male    DOB: [birth year], 66 y.o.   MRN: [id number]  HPI 65yo CM c/o wound to R hand.  Occurred last night, bumped into something and denuded the skin.  Continues to ooze and bleed a little bit.  Cleaned it well with soap and water and applied mupircin 2%.   Review of Systems  All other systems reviewed and are negative.       Objective:   Physical Exam  Vitals reviewed. Constitutional: He appears well-developed and well-nourished.  Skin:       1cm wound dorsum of R hand with skin flap in tact.  Pt skin on hands slightly friable.  Cleansed with peroxide.  Steristrips applied.  Bandaged.  No erythema or signs of infection          Assessment & Plan:  Wound care discussed

## 2012-01-13 ENCOUNTER — Ambulatory Visit (INDEPENDENT_AMBULATORY_CARE_PROVIDER_SITE_OTHER): Payer: 59 | Admitting: Emergency Medicine

## 2012-01-13 DIAGNOSIS — R05 Cough: Secondary | ICD-10-CM

## 2012-01-13 DIAGNOSIS — J309 Allergic rhinitis, unspecified: Secondary | ICD-10-CM

## 2012-01-13 DIAGNOSIS — M25549 Pain in joints of unspecified hand: Secondary | ICD-10-CM

## 2012-01-13 DIAGNOSIS — M159 Polyosteoarthritis, unspecified: Secondary | ICD-10-CM

## 2012-01-13 DIAGNOSIS — R059 Cough, unspecified: Secondary | ICD-10-CM

## 2012-01-13 MED ORDER — METHYLPREDNISOLONE ACETATE 80 MG/ML IJ SUSP
120.0000 mg | Freq: Once | INTRAMUSCULAR | Status: AC
  Administered 2012-01-13: 120 mg via INTRAMUSCULAR

## 2012-01-13 NOTE — Progress Notes (Signed)
  Subjective:    Patient ID: Kyle [name], male    DOB: 09-Jan-1946, 66 y.o.   MRN: [id number]  HPI patient presents with a one-week history of worsening cough and congestion. 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 He generally gets a shot of Depo-Medrol each bringing it does seem to help with his symptomatology.    Review of Systems He has no other symptoms. He has not had fever or chills or other symptoms     Objective:   Physical Exam HEENT exam reveals nasal congestion mild redness of the throat his lungs were clear        Assessment & Plan:  Assessment allergic rhinitis. Will give 120 Depo-Medrol IM.

## 2012-01-24 ENCOUNTER — Ambulatory Visit (INDEPENDENT_AMBULATORY_CARE_PROVIDER_SITE_OTHER): Payer: 59 | Admitting: Emergency Medicine

## 2012-01-24 VITALS — BP 129/82 | HR 69 | Temp 98.3°F | Resp 18

## 2012-01-24 DIAGNOSIS — IMO0001 Reserved for inherently not codable concepts without codable children: Secondary | ICD-10-CM

## 2012-01-24 DIAGNOSIS — R5383 Other fatigue: Secondary | ICD-10-CM

## 2012-01-24 DIAGNOSIS — Z79899 Other long term (current) drug therapy: Secondary | ICD-10-CM

## 2012-01-24 DIAGNOSIS — R310 Gross hematuria: Secondary | ICD-10-CM

## 2012-01-24 DIAGNOSIS — I10 Essential (primary) hypertension: Secondary | ICD-10-CM

## 2012-01-24 DIAGNOSIS — R5381 Other malaise: Secondary | ICD-10-CM

## 2012-01-24 DIAGNOSIS — M791 Myalgia, unspecified site: Secondary | ICD-10-CM

## 2012-01-24 DIAGNOSIS — M549 Dorsalgia, unspecified: Secondary | ICD-10-CM

## 2012-01-24 DIAGNOSIS — E785 Hyperlipidemia, unspecified: Secondary | ICD-10-CM

## 2012-01-24 LAB — POCT URINALYSIS DIPSTICK
Blood, UA: NEGATIVE
Glucose, UA: NEGATIVE
Ketones, UA: NEGATIVE
Spec Grav, UA: 1.015
Urobilinogen, UA: 0.2

## 2012-01-24 LAB — LIPID PANEL
LDL Cholesterol: 96 mg/dL (ref 0–99)
Total CHOL/HDL Ratio: 2.6 Ratio
VLDL: 14 mg/dL (ref 0–40)

## 2012-01-24 LAB — BASIC METABOLIC PANEL
BUN: 13 mg/dL (ref 6–23)
CO2: 28 mEq/L (ref 19–32)
Chloride: 96 mEq/L (ref 96–112)
Glucose, Bld: 117 mg/dL — ABNORMAL HIGH (ref 70–99)
Potassium: 4.2 mEq/L (ref 3.5–5.3)
Sodium: 130 mEq/L — ABNORMAL LOW (ref 135–145)

## 2012-01-24 LAB — POCT CBC
HCT, POC: 42.5 % — AB (ref 43.5–53.7)
Hemoglobin: 14.1 g/dL (ref 14.1–18.1)
Lymph, poc: 2.4 (ref 0.6–3.4)
MCH, POC: 29.2 pg (ref 27–31.2)
MCHC: 33.2 g/dL (ref 31.8–35.4)
POC Granulocyte: 4.9 (ref 2–6.9)
RBC: 4.83 M/uL (ref 4.69–6.13)
WBC: 7.9 10*3/uL (ref 4.6–10.2)

## 2012-01-24 LAB — POCT SEDIMENTATION RATE: POCT SED RATE: 3 mm/hr (ref 0–22)

## 2012-01-24 LAB — POCT UA - MICROSCOPIC ONLY
Bacteria, U Microscopic: NEGATIVE
Casts, Ur, LPF, POC: NEGATIVE
Mucus, UA: NEGATIVE

## 2012-01-24 LAB — CK: Total CK: 163 U/L (ref 7–232)

## 2012-01-24 NOTE — Progress Notes (Signed)
  Subjective:    Patient ID: ANIRUDDH CIAVARELLA, male    DOB: 05/14/1946, 66 y.o.   MRN: [id number]  HPI patient presents for followup of myalgias while taking Lipitor. He an elevated CPK. We decided to decrease the dose of Lipitor and see how he did. Since doing that he has felt remarkably better he's had decreased pain in his muscles. He did exercise this morning doing primarily stretches but no vigorous exercise    Review of Systems he has not seen any hematuria recently he has had an issue with them in the past     Objective:   Physical Exam  Constitutional: He appears well-developed and well-nourished.  Eyes: Pupils are equal, round, and reactive to light.  Neck: No JVD present. No tracheal deviation present. No thyromegaly present.  Cardiovascular: Normal rate and regular rhythm.   Pulmonary/Chest: Effort normal and breath sounds normal. No respiratory distress. He has no wheezes. He has no rales. He exhibits no tenderness.  Abdominal: Soft. He exhibits no distension and no mass. There is no tenderness. There is no rebound and no guarding.  Lymphadenopathy:    He has no cervical adenopathy.          Assessment & Plan:   Patient is doing better on decreased dose of. Urine reveals resolution of his hematuria. Check his muscle enzymes sedimentation rate today as well as of the mat and lipid panel.

## 2012-01-25 ENCOUNTER — Encounter: Payer: Self-pay | Admitting: Radiology

## 2012-01-26 LAB — ALDOLASE: Aldolase: 7 U/L

## 2012-01-30 ENCOUNTER — Other Ambulatory Visit: Payer: Self-pay | Admitting: Internal Medicine

## 2012-01-30 ENCOUNTER — Other Ambulatory Visit: Payer: Self-pay | Admitting: Family Medicine

## 2012-02-09 ENCOUNTER — Telehealth: Payer: Self-pay | Admitting: Internal Medicine

## 2012-02-09 MED ORDER — FLUTICASONE FUROATE 27.5 MCG/SPRAY NA SUSP: 2.0000 | Freq: Every day | NASAL | Status: DC

## 2012-02-09 NOTE — Telephone Encounter (Signed)
Flonase prescription changed to dispense #3 every 90 days as needed, per Dr. Leodis Liverpool' request.

## 2012-02-16 ENCOUNTER — Other Ambulatory Visit: Payer: Self-pay | Admitting: Physician Assistant

## 2012-02-21 ENCOUNTER — Other Ambulatory Visit: Payer: Self-pay | Admitting: Emergency Medicine

## 2012-02-21 NOTE — Telephone Encounter (Signed)
Pharmacist called for refill on singulair for 90 day supply.  Per Kennedy Bucker refill with 1 year refill.

## 2012-02-26 ENCOUNTER — Other Ambulatory Visit: Payer: Self-pay | Admitting: Dermatology

## 2012-04-05 ENCOUNTER — Ambulatory Visit (INDEPENDENT_AMBULATORY_CARE_PROVIDER_SITE_OTHER): Payer: 59 | Admitting: Physician Assistant

## 2012-04-05 VITALS — BP 136/80 | HR 76 | Temp 98.4°F | Resp 16

## 2012-04-05 DIAGNOSIS — S0100XA Unspecified open wound of scalp, initial encounter: Secondary | ICD-10-CM

## 2012-04-05 DIAGNOSIS — R51 Headache: Secondary | ICD-10-CM

## 2012-04-05 DIAGNOSIS — R519 Headache, unspecified: Secondary | ICD-10-CM

## 2012-04-05 NOTE — Progress Notes (Signed)
   Patient ID: Kyle [name] MRN: [id number], DOB: August 01, 1946, 66 y.o. Date of Encounter: 04/05/2012, 11:54 AM  S: Patient was working in the garage today and stood up hitting the top of his head on the corner of a cabinet. Last tetanus 2010. No dizziness, vision changes, nausea, or vomiting. Mild HA.   O:  Neuro: AO x 3. Gait normal. CN II-XII grossly intact.        Skin: 1 cm superficial abrasion with thin flap on crown of head.   PROCEDURE NOTE: Verbal consent obtained. Sterile technique employed. Numbing: Local anesthesia obtained with 2% lidocaine plain.  Cleansed with soap and water. Irrigated.   Wound explored, no deep structures involved, no foreign bodies.   Wound repaired with 2 steri-stips. No sutures needed.  Hemostasis obtained. Wound care instructions including precautions covered with patient.   [name], PA-C 04/05/2012 11:54 AM  A/P: 1. Wound, scalp uncomplicated             Wound care provided. Follow up prn.

## 2012-04-15 ENCOUNTER — Other Ambulatory Visit: Payer: Self-pay | Admitting: Family Medicine

## 2012-04-15 ENCOUNTER — Other Ambulatory Visit: Payer: Self-pay | Admitting: Emergency Medicine

## 2012-05-09 ENCOUNTER — Ambulatory Visit: Payer: 59

## 2012-05-13 ENCOUNTER — Ambulatory Visit: Payer: 59

## 2012-05-13 ENCOUNTER — Other Ambulatory Visit: Payer: Self-pay | Admitting: Physician Assistant

## 2012-05-13 ENCOUNTER — Ambulatory Visit: Payer: 59 | Admitting: Physician Assistant

## 2012-05-13 DIAGNOSIS — M25519 Pain in unspecified shoulder: Secondary | ICD-10-CM

## 2012-05-13 DIAGNOSIS — N529 Male erectile dysfunction, unspecified: Secondary | ICD-10-CM

## 2012-05-13 DIAGNOSIS — M25559 Pain in unspecified hip: Secondary | ICD-10-CM

## 2012-05-13 DIAGNOSIS — J309 Allergic rhinitis, unspecified: Secondary | ICD-10-CM

## 2012-05-13 DIAGNOSIS — L570 Actinic keratosis: Secondary | ICD-10-CM

## 2012-05-13 MED ORDER — FLUOROURACIL 5 % EX CREA: TOPICAL_CREAM | Freq: Two times a day (BID) | CUTANEOUS | Status: AC

## 2012-05-13 MED ORDER — AZELASTINE HCL 0.15 % NA SOLN: 2.0000 | Freq: Two times a day (BID) | NASAL | Status: DC

## 2012-05-13 MED ORDER — SILDENAFIL CITRATE 50 MG PO TABS: 50.0000 mg | ORAL_TABLET | Freq: Every day | ORAL | Status: DC | PRN

## 2012-05-13 NOTE — Progress Notes (Signed)
No formal provider/patient encounter occurred today. Xrays were not ordered by me. Will forward to Dr. Cleta Alberts. Patient also requests refill of Astepro, Viagra, and Efudex written by dermatologist. These were refilled.  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

## 2012-07-12 ENCOUNTER — Telehealth: Payer: Self-pay | Admitting: Radiology

## 2012-07-13 ENCOUNTER — Other Ambulatory Visit: Payer: Self-pay | Admitting: Physician Assistant

## 2012-07-13 MED ORDER — METOPROLOL SUCCINATE ER 25 MG PO TB24: ORAL_TABLET | ORAL | Status: DC

## 2012-07-16 NOTE — Telephone Encounter (Signed)
Flu shot only encounter 

## 2012-08-09 ENCOUNTER — Other Ambulatory Visit: Payer: Self-pay | Admitting: Physician Assistant

## 2012-08-14 ENCOUNTER — Other Ambulatory Visit: Payer: Self-pay | Admitting: Physician Assistant

## 2012-08-25 ENCOUNTER — Other Ambulatory Visit: Payer: Self-pay | Admitting: Physician Assistant

## 2012-08-25 MED ORDER — OMEPRAZOLE 20 MG PO CPDR: 20.0000 mg | DELAYED_RELEASE_CAPSULE | Freq: Every day | ORAL | Status: DC

## 2012-09-30 ENCOUNTER — Other Ambulatory Visit: Payer: Self-pay | Admitting: Physician Assistant

## 2012-09-30 MED ORDER — CLOBETASOL PROPIONATE 0.05 % EX OINT: TOPICAL_OINTMENT | Freq: Two times a day (BID) | CUTANEOUS | Status: DC

## 2012-10-01 ENCOUNTER — Ambulatory Visit (INDEPENDENT_AMBULATORY_CARE_PROVIDER_SITE_OTHER): Payer: 59 | Admitting: Physician Assistant

## 2012-10-01 DIAGNOSIS — M1990 Unspecified osteoarthritis, unspecified site: Secondary | ICD-10-CM

## 2012-10-01 DIAGNOSIS — M25539 Pain in unspecified wrist: Secondary | ICD-10-CM

## 2012-10-01 NOTE — Progress Notes (Signed)
Pt with osteoarthritis of the wrist

## 2012-11-13 ENCOUNTER — Other Ambulatory Visit: Payer: Self-pay | Admitting: Physician Assistant

## 2012-11-13 ENCOUNTER — Ambulatory Visit (INDEPENDENT_AMBULATORY_CARE_PROVIDER_SITE_OTHER): Payer: 59 | Admitting: Physician Assistant

## 2012-11-13 VITALS — BP 136/88 | HR 71 | Temp 98.6°F | Resp 16 | Ht 77.0 in | Wt 210.0 lb

## 2012-11-13 DIAGNOSIS — H9203 Otalgia, bilateral: Secondary | ICD-10-CM

## 2012-11-13 DIAGNOSIS — N529 Male erectile dysfunction, unspecified: Secondary | ICD-10-CM

## 2012-11-13 DIAGNOSIS — H6120 Impacted cerumen, unspecified ear: Secondary | ICD-10-CM

## 2012-11-13 DIAGNOSIS — H9209 Otalgia, unspecified ear: Secondary | ICD-10-CM

## 2012-11-13 DIAGNOSIS — H9319 Tinnitus, unspecified ear: Secondary | ICD-10-CM

## 2012-11-13 DIAGNOSIS — H9190 Unspecified hearing loss, unspecified ear: Secondary | ICD-10-CM

## 2012-11-13 MED ORDER — SILDENAFIL CITRATE 50 MG PO TABS: 50.0000 mg | ORAL_TABLET | Freq: Every day | ORAL | Status: DC | PRN

## 2012-11-13 NOTE — Progress Notes (Signed)
  Subjective:    Patient ID: Kyle [name], male    DOB: 11-04-1945, 67 y.o.   MRN: [id number]  HPI 67 yr old CM presents with B ear pain, decreased hearing after swimming for about an hour one day this week.  He is also having mild tinnitus.  He did have a recent URI but feels he has recovered from that.  Review of Systems  All other systems reviewed and are negative.       Objective:   Physical Exam  Nursing note and vitals reviewed. Constitutional: He is oriented to person, place, and time. He appears well-developed and well-nourished.  HENT:  Head: Normocephalic and atraumatic.  Mouth/Throat: No oropharyngeal exudate.       B canals are completely blocked with cerumen.  Used a combination with irrigation and significant light guided and magnified instrumentation to remove the cerumen in multiple pieces.  Patient tolerated well.  There was a very small area that bled a little in the L canal.  B TM are WNL.  Cardiovascular: Normal rate.   Pulmonary/Chest: Effort normal.  Neurological: He is alert and oriented to person, place, and time.  Skin: Skin is warm and dry.  Psychiatric: He has a normal mood and affect. His behavior is normal.          Assessment & Plan:  B otalgia Tinnitus Cerumen impaction  hearing loss All releived after irrigation and instrumentation.

## 2012-11-13 NOTE — Progress Notes (Signed)
Patient called requesting refill of Viagra. Regularly followed by Dr. Cleta Alberts. He is otherwise doing well. Ok to fill medication with refills.   Eula Listen, PA-C 11/13/2012 8:34 PM

## 2012-11-17 ENCOUNTER — Encounter: Payer: Self-pay | Admitting: Physician Assistant

## 2012-11-27 ENCOUNTER — Ambulatory Visit (INDEPENDENT_AMBULATORY_CARE_PROVIDER_SITE_OTHER): Payer: 59 | Admitting: Emergency Medicine

## 2012-11-27 ENCOUNTER — Other Ambulatory Visit: Payer: Self-pay | Admitting: Dermatology

## 2012-11-27 ENCOUNTER — Ambulatory Visit: Payer: 59

## 2012-11-27 ENCOUNTER — Encounter: Payer: Self-pay | Admitting: Emergency Medicine

## 2012-11-27 VITALS — BP 111/72 | HR 70 | Temp 98.1°F | Resp 16 | Ht 76.0 in | Wt 210.0 lb

## 2012-11-27 DIAGNOSIS — R39198 Other difficulties with micturition: Secondary | ICD-10-CM

## 2012-11-27 DIAGNOSIS — M542 Cervicalgia: Secondary | ICD-10-CM

## 2012-11-27 DIAGNOSIS — IMO0001 Reserved for inherently not codable concepts without codable children: Secondary | ICD-10-CM

## 2012-11-27 DIAGNOSIS — I10 Essential (primary) hypertension: Secondary | ICD-10-CM

## 2012-11-27 DIAGNOSIS — Z125 Encounter for screening for malignant neoplasm of prostate: Secondary | ICD-10-CM

## 2012-11-27 DIAGNOSIS — R51 Headache: Secondary | ICD-10-CM

## 2012-11-27 DIAGNOSIS — E119 Type 2 diabetes mellitus without complications: Secondary | ICD-10-CM

## 2012-11-27 LAB — GLUCOSE, POCT (MANUAL RESULT ENTRY): POC Glucose: 123 mg/dl — AB (ref 70–99)

## 2012-11-27 LAB — COMPREHENSIVE METABOLIC PANEL
Albumin: 4 g/dL (ref 3.5–5.2)
Alkaline Phosphatase: 75 U/L (ref 39–117)
BUN: 14 mg/dL (ref 6–23)
Creat: 0.94 mg/dL (ref 0.50–1.35)
Glucose, Bld: 130 mg/dL — ABNORMAL HIGH (ref 70–99)
Total Bilirubin: 0.7 mg/dL (ref 0.3–1.2)

## 2012-11-27 LAB — POCT CBC
Lymph, poc: 2.4 (ref 0.6–3.4)
MCH, POC: 30.3 pg (ref 27–31.2)
MCHC: 33.3 g/dL (ref 31.8–35.4)
MID (cbc): 0.8 (ref 0–0.9)
MPV: 7.7 fL (ref 0–99.8)
POC Granulocyte: 3.3 (ref 2–6.9)
POC MID %: 11.6 %M (ref 0–12)
Platelet Count, POC: 300 10*3/uL (ref 142–424)
RDW, POC: 14.3 %
WBC: 6.5 10*3/uL (ref 4.6–10.2)

## 2012-11-27 LAB — LIPID PANEL: Cholesterol: 154 mg/dL (ref 0–200)

## 2012-11-27 LAB — IFOBT (OCCULT BLOOD): IFOBT: NEGATIVE

## 2012-11-27 NOTE — Progress Notes (Signed)
  Subjective:    Patient ID: Kyle [name], male    DOB: [birth year], 67 y.o.   MRN: [id number]  HPI interspersed multiple issues he would like to discuss. #1 patient has a history of hypertension and is currently taking his medications on regular basis. He is a patient of Dr. Golden Hurter and sees him on a yearly basis because of his family history of coronary disease diabetes this has been managed with diet alone. 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 He does have discomfort when he moves his neck around. Problem #5 is BPH. He does have difficulty with urinary stream and has used Flomax in the past but not currently taking this medication     Review of Systems     Objective:   Physical Exam patient is alert and cooperative in no distress. Cranial nerves II through XII are intact. He has no focal weakness in the upper or lower extremities. There is tenderness over the C-spine but decreased range of motion to lateral movement flexion and extension. There are no carotid bruits heard his chest is clear to auscultation and percussion his heart regular rate without murmurs rubs or gallops. Abdominal exam reveals tone no tenderness or masses liver and spleen not enlarged .   Results for orders placed in visit on 11/27/12  POCT CBC      Component Value Range   WBC 6.5  4.6 - 10.2 K/uL   Lymph, poc 2.4  0.6 - 3.4   POC LYMPH PERCENT 37.3  10 - 50 %L   MID (cbc) 0.8  0 - 0.9   POC MID % 11.6  0 - 12 %M   POC Granulocyte 3.3  2 - 6.9   Granulocyte percent 51.1  37 - 80 %G   RBC 4.98  4.69 - 6.13 M/uL   Hemoglobin 15.1  14.1 - 18.1 g/dL   HCT, POC 91.4  78.2 - 53.7 %   MCV 91.2  80 - 97 fL   MCH, POC 30.3  27 - 31.2 pg   MCHC 33.3  31.8 - 35.4 g/dL   RDW, POC 95.6     Platelet Count, POC 300  142 - 424 K/uL   MPV 7.7  0 - 99.8 fL  GLUCOSE, POCT (MANUAL RESULT ENTRY)      Component Value Range    POC Glucose 123 (*) 70 - 99 mg/dl  POCT GLYCOSYLATED HEMOGLOBIN (HGB A1C)      Component Value Range   Hemoglobin A1C 5.7     [facility] reading (PRIMARY) by  Dr. 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 There is mild amount of carotid calcification.      Assessment & Plan:  At the present time but he would like to discuss with Dr. Daleen Squibb the possibility of having carotid ultrasound done. He would also like to discuss with Dr. Sandria Manly  whether he needs an MRI and what type of CNS imaging would be the most appropriate with his new onset of occipital headaches. He will let me know after he talks with these 2 physicians about the next day in his management. Routine labs were done today to followup on his hypertension, hyperlipidemia, and difficulty with urinary stream.

## 2013-01-21 ENCOUNTER — Other Ambulatory Visit: Payer: Self-pay | Admitting: Physician Assistant

## 2013-04-30 ENCOUNTER — Other Ambulatory Visit: Payer: Self-pay | Admitting: Physician Assistant

## 2013-04-30 NOTE — Telephone Encounter (Signed)
Please check to be sure there are not 2 sets of prescriptions sent for Dr. Perrin Maltese to get his Ambien. I okayed a prescription for Ambien 10 mg 1 each bedtime #90 with one refill to be sent to [facility]. I will be in the office tomorrow and happy to sign it.

## 2013-04-30 NOTE — Telephone Encounter (Signed)
Dr Cleta Alberts, it seems like you primarily see Dr Perrin Maltese, so routing this to you.

## 2013-05-24 ENCOUNTER — Encounter (HOSPITAL_COMMUNITY): Payer: Self-pay | Admitting: *Deleted

## 2013-05-24 ENCOUNTER — Other Ambulatory Visit: Payer: Self-pay

## 2013-05-24 ENCOUNTER — Observation Stay (HOSPITAL_COMMUNITY)
Admission: EM | Admit: 2013-05-24 | Discharge: 2013-05-26 | Disposition: A | Discharge status: Home / Self Care | Payer: 59 | Attending: Cardiology | Admitting: Cardiology

## 2013-05-24 ENCOUNTER — Ambulatory Visit (INDEPENDENT_AMBULATORY_CARE_PROVIDER_SITE_OTHER): Payer: 59 | Admitting: Emergency Medicine

## 2013-05-24 ENCOUNTER — Ambulatory Visit: Payer: 59

## 2013-05-24 DIAGNOSIS — E871 Hypo-osmolality and hyponatremia: Secondary | ICD-10-CM

## 2013-05-24 DIAGNOSIS — Z79899 Other long term (current) drug therapy: Secondary | ICD-10-CM | POA: Insufficient documentation

## 2013-05-24 DIAGNOSIS — R059 Cough, unspecified: Secondary | ICD-10-CM

## 2013-05-24 DIAGNOSIS — I200 Unstable angina: Secondary | ICD-10-CM

## 2013-05-24 DIAGNOSIS — R42 Dizziness and giddiness: Secondary | ICD-10-CM

## 2013-05-24 DIAGNOSIS — R079 Chest pain, unspecified: Secondary | ICD-10-CM

## 2013-05-24 DIAGNOSIS — R062 Wheezing: Secondary | ICD-10-CM

## 2013-05-24 DIAGNOSIS — R110 Nausea: Secondary | ICD-10-CM

## 2013-05-24 DIAGNOSIS — R05 Cough: Secondary | ICD-10-CM

## 2013-05-24 DIAGNOSIS — I10 Essential (primary) hypertension: Secondary | ICD-10-CM | POA: Insufficient documentation

## 2013-05-24 DIAGNOSIS — R0602 Shortness of breath: Secondary | ICD-10-CM

## 2013-05-24 DIAGNOSIS — I2510 Atherosclerotic heart disease of native coronary artery without angina pectoris: Secondary | ICD-10-CM | POA: Insufficient documentation

## 2013-05-24 DIAGNOSIS — R072 Precordial pain: Principal | ICD-10-CM | POA: Diagnosis present

## 2013-05-24 HISTORY — DX: Essential (primary) hypertension: I10

## 2013-05-24 HISTORY — DX: Unspecified asthma, uncomplicated: J45.909

## 2013-05-24 LAB — COMPREHENSIVE METABOLIC PANEL
ALT: 31 U/L (ref 0–53)
AST: 25 U/L (ref 0–37)
AST: 26 U/L (ref 0–37)
Albumin: 3.9 g/dL (ref 3.5–5.2)
Albumin: 3.6 g/dL (ref 3.5–5.2)
Alkaline Phosphatase: 73 U/L (ref 39–117)
Alkaline Phosphatase: 77 U/L (ref 39–117)
BUN: 12 mg/dL (ref 6–23)
Calcium: 8.8 mg/dL (ref 8.4–10.5)
Chloride: 103 mEq/L (ref 96–112)
Potassium: 4.2 mEq/L (ref 3.5–5.3)
Potassium: 4.2 mEq/L (ref 3.5–5.1)
Sodium: 136 mEq/L (ref 135–145)
Total Protein: 6.1 g/dL (ref 6.0–8.3)
Total Protein: 6.2 g/dL (ref 6.0–8.3)

## 2013-05-24 LAB — CBC WITH DIFFERENTIAL/PLATELET
Basophils Absolute: 0 10*3/uL (ref 0.0–0.1)
Basophils Relative: 1 % (ref 0–1)
Eosinophils Absolute: 0.1 10*3/uL (ref 0.0–0.7)
Hemoglobin: 14.2 g/dL (ref 13.0–17.0)
MCH: 30.7 pg (ref 26.0–34.0)
MCHC: 35 g/dL (ref 30.0–36.0)
Monocytes Relative: 10 % (ref 3–12)
Neutro Abs: 4.4 10*3/uL (ref 1.7–7.7)
Neutrophils Relative %: 64 % (ref 43–77)
Platelets: 244 10*3/uL (ref 150–400)
RDW: 13.4 % (ref 11.5–15.5)

## 2013-05-24 LAB — MRSA PCR SCREENING: MRSA by PCR: NEGATIVE

## 2013-05-24 LAB — POCT CBC
HCT, POC: 43.9 % (ref 43.5–53.7)
MCH, POC: 30.3 pg (ref 27–31.2)
MCV: 94.1 fL (ref 80–97)
MID (cbc): 0.5 (ref 0–0.9)
POC LYMPH PERCENT: 26.7 %L (ref 10–50)
Platelet Count, POC: 276 10*3/uL (ref 142–424)
RBC: 4.66 M/uL — AB (ref 4.69–6.13)
RDW, POC: 13.2 %
WBC: 6.2 10*3/uL (ref 4.6–10.2)

## 2013-05-24 LAB — TROPONIN I
Troponin I: <0.3 ng/mL (ref <0.30)
Troponin I: <0.3 ng/mL (ref <0.30)
Troponin I: <0.3 ng/mL (ref <0.30)

## 2013-05-24 LAB — URINALYSIS, ROUTINE W REFLEX MICROSCOPIC
Bilirubin Urine: NEGATIVE
Glucose, UA: NEGATIVE mg/dL
Hgb urine dipstick: NEGATIVE
Protein, ur: NEGATIVE mg/dL
Specific Gravity, Urine: 1.014 (ref 1.005–1.030)
Urobilinogen, UA: 0.2 mg/dL (ref 0.0–1.0)

## 2013-05-24 MED ORDER — PANTOPRAZOLE SODIUM 40 MG PO TBEC
40.0000 mg | DELAYED_RELEASE_TABLET | Freq: Once | ORAL | Status: AC
  Administered 2013-05-24: 40 mg via ORAL

## 2013-05-24 MED ORDER — NITROGLYCERIN 0.4 MG SL SUBL
0.4000 mg | SUBLINGUAL_TABLET | SUBLINGUAL | Status: DC | PRN
  Administered 2013-05-24 (×2): 0.4 mg via SUBLINGUAL

## 2013-05-24 MED ORDER — MORPHINE SULFATE 2 MG/ML IJ SOLN
INTRAMUSCULAR | Status: AC
  Administered 2013-05-24: 2 mg via INTRAVENOUS
  Filled 2013-05-24: qty 1

## 2013-05-24 MED ORDER — ALPRAZOLAM 0.5 MG PO TABS
0.5000 mg | ORAL_TABLET | Freq: Four times a day (QID) | ORAL | Status: DC | PRN
  Administered 2013-05-25 – 2013-05-26 (×3): 0.5 mg via ORAL
  Filled 2013-05-24 (×4): qty 1

## 2013-05-24 MED ORDER — ZOLPIDEM TARTRATE 5 MG PO TABS
5.0000 mg | ORAL_TABLET | Freq: Every evening | ORAL | Status: DC | PRN
  Administered 2013-05-24 – 2013-05-25 (×2): 5 mg via ORAL
  Filled 2013-05-24 (×2): qty 1

## 2013-05-24 MED ORDER — ASPIRIN EC 81 MG PO TBEC
81.0000 mg | DELAYED_RELEASE_TABLET | Freq: Every day | ORAL | Status: AC
  Administered 2013-05-25: 81 mg via ORAL
  Filled 2013-05-24: qty 1

## 2013-05-24 MED ORDER — GI COCKTAIL ~~LOC~~
30.0000 mL | Freq: Once | ORAL | Status: DC
  Filled 2013-05-24: qty 30

## 2013-05-24 MED ORDER — SODIUM CHLORIDE 0.9 % IJ SOLN: 3.0000 mL | INTRAMUSCULAR | Status: DC | PRN

## 2013-05-24 MED ORDER — ACETAMINOPHEN 325 MG PO TABS: 650.0000 mg | ORAL_TABLET | ORAL | Status: DC | PRN

## 2013-05-24 MED ORDER — ASPIRIN 81 MG PO CHEW: 324.0000 mg | CHEWABLE_TABLET | ORAL | Status: DC

## 2013-05-24 MED ORDER — ASPIRIN 81 MG PO CHEW
324.0000 mg | CHEWABLE_TABLET | ORAL | Status: AC
  Administered 2013-05-26: 324 mg via ORAL
  Filled 2013-05-24: qty 4

## 2013-05-24 MED ORDER — METOPROLOL SUCCINATE ER 25 MG PO TB24
25.0000 mg | ORAL_TABLET | Freq: Two times a day (BID) | ORAL | Status: DC
  Administered 2013-05-24 – 2013-05-26 (×4): 25 mg via ORAL
  Filled 2013-05-24 (×5): qty 1

## 2013-05-24 MED ORDER — SODIUM CHLORIDE 0.9 % IV SOLN: 250.0000 mL | INTRAVENOUS | Status: DC | PRN

## 2013-05-24 MED ORDER — NITROGLYCERIN 0.4 MG SL SUBL: 0.4000 mg | SUBLINGUAL_TABLET | SUBLINGUAL | Status: DC | PRN

## 2013-05-24 MED ORDER — HEPARIN (PORCINE) IN NACL 100-0.45 UNIT/ML-% IJ SOLN
1200.0000 [IU]/h | INTRAMUSCULAR | Status: DC
  Administered 2013-05-24 – 2013-05-25 (×2): 1300 [IU]/h via INTRAVENOUS
  Administered 2013-05-26: 1200 [IU]/h via INTRAVENOUS
  Filled 2013-05-24 (×5): qty 250

## 2013-05-24 MED ORDER — SODIUM CHLORIDE 0.9 % IJ SOLN
3.0000 mL | Freq: Two times a day (BID) | INTRAMUSCULAR | Status: DC
  Administered 2013-05-26: 3 mL via INTRAVENOUS

## 2013-05-24 MED ORDER — HEPARIN BOLUS VIA INFUSION
4000.0000 [IU] | Freq: Once | INTRAVENOUS | Status: AC
  Administered 2013-05-24: 4000 [IU] via INTRAVENOUS
  Filled 2013-05-24: qty 4000

## 2013-05-24 MED ORDER — ASPIRIN EC 81 MG PO TBEC: 81.0000 mg | DELAYED_RELEASE_TABLET | ORAL | Status: DC

## 2013-05-24 MED ORDER — NITROGLYCERIN 0.4 MG SL SUBL
SUBLINGUAL_TABLET | SUBLINGUAL | Status: AC
  Administered 2013-05-24: 0.4 mg
  Filled 2013-05-24: qty 75

## 2013-05-24 MED ORDER — ONDANSETRON HCL 4 MG/2ML IJ SOLN: 4.0000 mg | Freq: Three times a day (TID) | INTRAMUSCULAR | Status: DC | PRN

## 2013-05-24 MED ORDER — ONDANSETRON HCL 4 MG/2ML IJ SOLN: 4.0000 mg | Freq: Four times a day (QID) | INTRAMUSCULAR | Status: DC | PRN

## 2013-05-24 MED ORDER — DILTIAZEM HCL ER 240 MG PO CP24
240.0000 mg | ORAL_CAPSULE | Freq: Every day | ORAL | Status: DC
  Administered 2013-05-25 – 2013-05-26 (×2): 240 mg via ORAL
  Filled 2013-05-24 (×2): qty 1

## 2013-05-24 MED ORDER — SODIUM CHLORIDE 0.9 % IV SOLN
INTRAVENOUS | Status: DC
  Administered 2013-05-26: 03:00:00 via INTRAVENOUS

## 2013-05-24 MED ORDER — MORPHINE SULFATE 2 MG/ML IJ SOLN
2.0000 mg | INTRAMUSCULAR | Status: DC | PRN
  Administered 2013-05-24: 2 mg via INTRAVENOUS
  Filled 2013-05-24: qty 1

## 2013-05-24 MED ORDER — PANTOPRAZOLE SODIUM 40 MG PO TBEC
40.0000 mg | DELAYED_RELEASE_TABLET | Freq: Every day | ORAL | Status: DC
  Administered 2013-05-25 – 2013-05-26 (×2): 40 mg via ORAL
  Filled 2013-05-24 (×3): qty 1

## 2013-05-24 MED ORDER — ASPIRIN EC 81 MG PO TBEC: 81.0000 mg | DELAYED_RELEASE_TABLET | Freq: Every day | ORAL | Status: DC

## 2013-05-24 MED ORDER — GI COCKTAIL ~~LOC~~
30.0000 mL | Freq: Once | ORAL | Status: AC
  Administered 2013-05-24: 30 mL via ORAL
  Filled 2013-05-24: qty 30

## 2013-05-24 MED ORDER — ATORVASTATIN CALCIUM 20 MG PO TABS
20.0000 mg | ORAL_TABLET | Freq: Every day | ORAL | Status: DC
  Administered 2013-05-24 – 2013-05-25 (×2): 20 mg via ORAL
  Filled 2013-05-24 (×3): qty 1

## 2013-05-24 NOTE — ED Notes (Signed)
Patient states feeling tried more that usual and nausea with epigastric chest pain and SOB x 3 day worsening through the night and this morning. Patient had breakfast this morning and denies V/F/D and not edema. Patient placed on cardiac monitor and pulse ox with sats of 99% on 2 L.

## 2013-05-24 NOTE — Progress Notes (Signed)
05/24/13 1715  Vitals  BP ! 155/92 mmHg  MAP (mmHg) 112  Pulse Rate 66  pt with c/o cp 7/10. Protocol inplace.Will continue to monitor and advise attending as needed. Cp down to 6/10 post i sl nitro 1734 Will continue to monitor and advise attending as needed.  Cp down to 4/10 post 2nd nitro sl 1739 Will continue to monitor and advise attending as needed.  Cp down to 3/10 post 3rd sl nitro Hr and BP remained stable throughout. No nausea. mid to left sternal. Waxing ache type pain. Md advised. Orders given see mar

## 2013-05-24 NOTE — ED Notes (Signed)
Dr. Wall at bedside.

## 2013-05-24 NOTE — Progress Notes (Signed)
Patient ID: DENE NAZIR, male   DOB: 1946/05/30, 67 y.o.   MRN: [id number]   Patient ID: BREYSON KELM MRN: [id number], DOB/AGE: [birth year]   Admit date: 05/24/2013   Primary Physician: No primary provider on file. Primary Cardiologist: Juanito Doom MD  Pt. 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  Problem List  Past Medical History  Diagnosis Date  . Asthma   . Hypertension     Past Surgical History  Procedure Laterality Date  . Joint replacement      Left hip replacement  . Heart catherization N/A 2007     Allergies  Allergies  Allergen Reactions  . Cefuroxime Axetil     REACTION: anaphyalxis  . Celecoxib   . Nsaids   . Penicillins   . Sulfa Antibiotics   . Clindamycin/Lincomycin Rash  . 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 At that time he had minor irregularity in the LAD, 40% obtuse marginal 1 stenosis, and a 30% to 40% right coronary artery stenosis. He had normal left ventricular function. He has done well and remains very active. He has not had exertional angina or ischemic symptoms. He is on a very aggressive secondary preventive strategy including aspirin, statin, and blood pressure management.  While eating dinner on Thursday night, he tried a salad dressing that did not agree with him. He felt discomfort as it went down his esophagus. Since that time he has not felt well. He started himself on Prilosec that evening and took it yesterday and today. He still has his discomfort in his chest which this morning has been unrelieved. It does not radiate. It seems to be made worse when he eats. It is not worse with exertion. However, he says he just doesn't feel like this is his reflux and this is more worrisome with extreme fatigue all day yesterday and  today. He's also had some dizziness and lightheadedness. He has had some nausea but no vomiting. He denies any diarrhea. He's been able to eat and drink fluids.  His EKG shows normal sinus rhythm and is essentially normal. Has not changed since his last ECG. Blood work is pending. Chest x-ray is unremarkable for any acute problem. CBC this morning was normal. Troponins are pending.  I gave him a sublingual nitroglycerin which gave him a headache but relieved his discomfort. He is now pain-free. He refuses a GI cocktail.  He used Viagra 2 nights ago.  Home Medications  Prior to Admission medications   Medication Sig Start Date End Date Taking? Authorizing Provider  ASMANEX 60 METERED DOSES 220 MCG/INH inhaler INHALE 2 PUFFS ONCE DAILY 04/15/12   Sondra Barges, PA-C  aspirin EC 81 MG tablet Take 81 mg by mouth daily.    Historical Provider, MD  atorvastatin (LIPITOR) 20 MG tablet Take 10 mg by mouth daily.     Historical Provider, MD  atorvastatin (LIPITOR) 40 MG tablet TAKE 1 TABLET BY MOUTH DAILY FOR CHOLESTEROL 08/09/12   Raymon Mutton Dunn, PA-C  Azelastine HCl (ASTEPRO) 0.15 % SOLN Place 2 sprays into the nose 2 (two) times daily. 05/13/12   Sondra Barges, PA-C  clobetasol ointment (TEMOVATE) 0.05 % Apply topically 2 (two) times daily. 09/30/12   Heather Jaquita Rector, PA-C  diltiazem (CARDIZEM CD) 120 MG 24 hr capsule TAKE  1 CAPSULE BY MOUTH EVERY 12 HOURS, NEEDS OFFICE VISIT IN SEPTEMBER 08/09/12   Sondra Barges, PA-C  diltiazem [facility]) 120 MG 24 hr capsule Take 120 mg by mouth 2 (two) times daily.    Historical Provider, MD  EPIPEN 2-PAK 0.3 MG/0.3ML DEVI USE AS DIRECTED 08/14/12   Godfrey Pick, PA-C  fluticasone (FLONASE) 50 MCG/ACT nasal spray USE 2 SPRAYS IN EACH NOSTRIL ONCE DAILY 01/30/12   Morrell Riddle, PA-C  fluticasone (VERAMYST) 27.5 MCG/SPRAY nasal spray Place 2 sprays into the nose daily. 02/09/12   Rickard Patience, PA-C  metoprolol succinate (TOPROL-XL) 25 MG 24 hr tablet 1 tab bid 07/13/12    Anders Simmonds, PA-C  omeprazole (PRILOSEC) 20 MG capsule Take 1 capsule (20 mg total) by mouth daily. 08/25/12   Heather Jaquita Rector, PA-C  ranitidine (ZANTAC) 150 MG tablet TAKE 1 TABLET BY MOUTH TWICE DAILY 04/15/12   Sondra Barges, PA-C  sildenafil (VIAGRA) 50 MG tablet Take 1 tablet (50 mg total) by mouth daily as needed for erectile dysfunction. 11/13/12 12/13/12  Raymon Mutton Dunn, PA-C  VIAGRA 100 MG tablet TAKE 1 TABLET BY MOUTH PRIOR TO INTERCOURSE 01/21/13   Nelva Nay, PA-C  zolpidem (AMBIEN) 10 MG tablet TAKE 1 TABLET BY MOUTH AT BEDTIME 04/30/13   Collene Gobble, MD    Family History  History reviewed. No pertinent family history.  Social History  History   Social History  . Marital Status: Married    Spouse Name: N/A    Number of Children: N/A  . Years of Education: N/A   Occupational History  . Not on file.   Social History Main Topics  . Smoking status: Never Smoker   . Smokeless tobacco: Not on file  . Alcohol Use: 0.0 oz/week     Comment: 2 drinks per day  . Drug Use: No  . Sexually Active: Not on file   Other Topics Concern  . Not on file   Social History Narrative  . No narrative on file     Review of Systems General:  No chills, fever, night sweats or weight changes.  Cardiovascular:  No  dyspnea on exertion, edema, orthopnea, palpitations, paroxysmal nocturnal dyspnea. Dermatological: No rash, lesions/masses Respiratory: No cough, dyspnea Urologic: No hematuria, dysuria Abdominal:   No vomiting, diarrhea, bright red blood per rectum, melena, or hematemesis Neurologic:  No visual changes, wkns, changes in mental status. All other systems reviewed and are otherwise negative except as noted above.  Physical Exam  There were no vitals taken for this visit.  General: Pleasant, NAD, ruddy complexion Psych: Normal affect. Neuro: Alert and oriented X 3. Moves all extremities spontaneously. HEENT: Normal  Neck: Supple without bruits or JVD. Lungs:  Resp  regular and unlabored, CTA. Heart: RRR no s3, s4, or murmurs. Abdomen: Soft, non-tender, non-distended, BS + x 4.  Extremities: No clubbing, cyanosis or edema. DP/PT/Radials 2+ and equal bilaterally.  Labs  No results found for this basename: CKTOTAL, CKMB, TROPONINI,  in the last 72 hours Lab Results  Component Value Date   WBC 6.2 05/24/2013   HGB 14.1 05/24/2013   HCT 43.9 05/24/2013   MCV 94.1 05/24/2013   PLT 224 07/19/2010   No results found for this basename: NA, K, CL, CO2, BUN, CREATININE, CALCIUM, LABALBU, PROT, BILITOT, ALKPHOS, ALT, AST, GLUCOSE,  in the last 168 hours Lab Results  Component Value Date   CHOL 154 11/27/2012   HDL 49 11/27/2012  LDLCALC 85 11/27/2012   TRIG 102 11/27/2012   No results found for this basename: DDIMER     Radiology/Studies  Dg Chest 2 View  05/24/2013   *RADIOLOGY REPORT*  Clinical Data: Cough and chest pain  CHEST - 2 VIEW  Comparison: July 19, 2010  Findings:  There is underlying emphysema.  There is no edema or consolidation.  Heart size and pulmonary vascularity are normal. No adenopathy.  There are multiple syndesmophytes  in the thoracic spine.  IMPRESSION:    Underlying emphysema.  No edema or consolidation. Question seronegative spondyloarthropathy.   Original Report Authenticated By: Bretta Bang, M.D.    ECG  Normal sinus rhythm, no acute changes, no change from old EKG.  ASSESSMENT AND PLAN #1 unstable angina. Some of his symptoms sound related to esophageal spasm or reflux but with his history of nonobstructive coronary artery disease 7 years ago and relief with nitroglycerin, will admit and assume this is unstable angina. Will begin IV heparin, continue aspirin, and other home medications including his statin and diltiazem. Final cardiac catheterization on Monday and let us gets recurrent discomfort with EKG changes. Troponin is pending. Patient understands plan and I discussed with his family. All questions  answered.   Signed, Valera Castle, MD 05/24/2013, @[facility]

## 2013-05-24 NOTE — ED Provider Notes (Signed)
CSN: [id number]     Arrival date & time 05/24/13  1134 History     First MD Initiated Contact with Patient 05/24/13 1136     Chief Complaint  Patient presents with  . Chest Pain  . Nausea   (Consider location/radiation/quality/duration/timing/severity/associated sxs/prior Treatment) HPI Pt with 3 days of generalized fatigue and 2 days of central non-radiating chest pain, worse when eating. +nausea. No vomiting. No abd pain. Pt was walking around today and became acutely fatigued and confused. Seen at urgent care. apspirin given and transferred to ED. Pt has strong family history of CAD. Mild CAD on cath 2007. Pt continues to have central chest pain. No lower ext swelling or pain. No recent prolonged travel or hospitalizations.  Past Medical History  Diagnosis Date  . Asthma   . Hypertension    Past Surgical History  Procedure Laterality Date  . Joint replacement      Left hip replacement  . Heart catherization N/A 2007   History reviewed. No pertinent family history. History  Substance Use Topics  . Smoking status: Never Smoker   . Smokeless tobacco: Not on file  . Alcohol Use: 0.0 oz/week     Comment: 2 drinks per day    Review of Systems  Constitutional: Positive for diaphoresis and fatigue. Negative for fever.  Respiratory: Negative for cough and shortness of breath.   Cardiovascular: Positive for chest pain. Negative for palpitations and leg swelling.  Gastrointestinal: Positive for nausea. Negative for vomiting, abdominal pain, diarrhea and constipation.  Musculoskeletal: Negative for myalgias and back pain.  Skin: Negative for rash and wound.  Neurological: Positive for weakness (generalized) and light-headedness. Negative for dizziness, numbness and headaches.  All other systems reviewed and are negative.    Allergies  Cefuroxime axetil; Celecoxib; Nsaids; Penicillins; Sulfa antibiotics; Clindamycin/lincomycin; and Doxycycline  Home Medications   Current  Outpatient Rx  Name  Route  Sig  Dispense  Refill  . aspirin EC 81 MG tablet   Oral   Take 81 mg by mouth every other day.          Marland Kitchen atorvastatin (LIPITOR) 40 MG tablet   Oral   Take 20 mg by mouth daily.         . Azelastine HCl (ASTEPRO) 0.15 % SOLN   Nasal   Place 1 spray into the nose daily as needed (for allergies).         Marland Kitchen diltiazem (DILACOR XR) 240 MG 24 hr capsule   Oral   Take 240 mg by mouth daily.         Marland Kitchen EPINEPHrine (EPIPEN) 0.3 mg/0.3 mL SOAJ   Intramuscular   Inject 0.3 mg into the muscle as needed (for allergic reactions).         . fluticasone (FLONASE) 50 MCG/ACT nasal spray   Nasal   Place 2 sprays into the nose daily as needed for rhinitis or allergies.         . metoprolol succinate (TOPROL-XL) 25 MG 24 hr tablet   Oral   Take 25 mg by mouth 2 (two) times daily.         . mometasone (ASMANEX) 220 MCG/INH inhaler   Inhalation   Inhale 2 puffs into the lungs daily.         Marland Kitchen omeprazole (PRILOSEC) 20 MG capsule   Oral   Take 1 capsule (20 mg total) by mouth daily.   90 capsule   3   . zolpidem (AMBIEN) 10 MG  tablet   Oral   Take 10 mg by mouth at bedtime as needed for sleep.          BP 129/79  Pulse 60  Resp 16  SpO2 97% Physical Exam  Nursing note and vitals reviewed. Constitutional: He is oriented to person, place, and time. He appears well-developed and well-nourished. No distress.  HENT:  Head: Normocephalic and atraumatic.  Mouth/Throat: Oropharynx is clear and moist.  Eyes: EOM are normal. Pupils are equal, round, and reactive to light.  Neck: Normal range of motion. Neck supple.  Cardiovascular: Normal rate and regular rhythm.  Exam reveals no gallop and no friction rub.   No murmur heard. Pulmonary/Chest: Effort normal and breath sounds normal. No respiratory distress. He has no wheezes. He has no rales. He exhibits no tenderness.  Abdominal: Soft. Bowel sounds are normal. He exhibits no distension and no  mass. There is no tenderness. There is no rebound and no guarding.  Musculoskeletal: Normal range of motion. He exhibits no edema and no tenderness.  No calf swelling or tenderness  Neurological: He is alert and oriented to person, place, and time.  5/5 motor in all ext, sensation intact  Skin: Skin is warm and dry. No rash noted. No erythema.  Psychiatric: He has a normal mood and affect. His behavior is normal.    ED Course   Procedures (including critical care time)  Labs Reviewed  CBC WITH DIFFERENTIAL  URINALYSIS, ROUTINE W REFLEX MICROSCOPIC  COMPREHENSIVE METABOLIC PANEL  TROPONIN I  LIPASE, BLOOD   Dg Chest 2 View  05/24/2013   *RADIOLOGY REPORT*  Clinical Data: Cough and chest pain  CHEST - 2 VIEW  Comparison: July 19, 2010  Findings:  There is underlying emphysema.  There is no edema or consolidation.  Heart size and pulmonary vascularity are normal. No adenopathy.  There are multiple syndesmophytes  in the thoracic spine.  IMPRESSION:    Underlying emphysema.  No edema or consolidation. Question seronegative spondyloarthropathy.   Original Report Authenticated By: Bretta Bang, M.D.   1. Chest pain     Date: 05/24/2013  Rate: 60  Rhythm: normal sinus rhythm  QRS Axis: normal  Intervals: PR prolonged  ST/T Wave abnormalities: normal  Conduction Disutrbances:first-degree A-V block   Narrative Interpretation:   Old EKG Reviewed: changes noted   MDM  Discussed with Dr Anola Gurney who saw pt in ED. Suggested SL NTG. Pt CP has now resolved. Dr Anola Gurney to admit.   Loren Racer, MD 05/24/13 [phone]

## 2013-05-24 NOTE — ED Notes (Signed)
Dr. Daleen Squibb with [facility] Cardiology at bedside.

## 2013-05-24 NOTE — ED Notes (Signed)
Patient denies any chest pain and denies nausea at this time. Patient resting with NAD at this time.

## 2013-05-24 NOTE — Progress Notes (Signed)
ANTICOAGULATION CONSULT NOTE - Initial Consult  Pharmacy Consult for Heparin Indication: chest pain/ACS  Allergies  Allergen Reactions  . Cefuroxime Axetil Anaphylaxis and Hives  . Celecoxib Anaphylaxis and Hives  . Nsaids Anaphylaxis and Hives    Pt takes aspirin every other day  . Penicillins Hives  . Sulfa Antibiotics Hives  . Clindamycin/Lincomycin Rash  . Doxycycline Rash    Patient Measurements:  Weight: 97.5kg Heparin Dosing Weight: 97.5kg  Vital Signs: BP: 139/76 mmHg (07/26 1345) Pulse Rate: 55 (07/26 1345)  Labs:  Recent Labs  05/24/13 1034 05/24/13 1238  HGB 14.1 14.2  HCT 43.9 40.6  PLT  --  244  CREATININE  --  0.91  TROPONINI  --  <0.30    The CrCl is unknown because both a height and weight (above a minimum accepted value) are required for this calculation.   Medical History: Past Medical History  Diagnosis Date  . Asthma   . Hypertension     Medications:  Prescriptions prior to admission  Medication Sig Dispense Refill  . aspirin EC 81 MG tablet Take 81 mg by mouth every other day.       Marland Kitchen atorvastatin (LIPITOR) 40 MG tablet Take 20 mg by mouth daily.      . Azelastine HCl (ASTEPRO) 0.15 % SOLN Place 1 spray into the nose daily as needed (for allergies).      Marland Kitchen diltiazem (DILACOR XR) 240 MG 24 hr capsule Take 240 mg by mouth daily.      Marland Kitchen EPINEPHrine (EPIPEN) 0.3 mg/0.3 mL SOAJ Inject 0.3 mg into the muscle as needed (for allergic reactions).      . fluticasone (FLONASE) 50 MCG/ACT nasal spray Place 2 sprays into the nose daily as needed for rhinitis or allergies.      . metoprolol succinate (TOPROL-XL) 25 MG 24 hr tablet Take 25 mg by mouth 2 (two) times daily.      . mometasone (ASMANEX) 220 MCG/INH inhaler Inhale 2 puffs into the lungs daily.      Marland Kitchen omeprazole (PRILOSEC) 20 MG capsule Take 1 capsule (20 mg total) by mouth daily.  90 capsule  3  . zolpidem (AMBIEN) 10 MG tablet Take 10 mg by mouth at bedtime as needed for sleep.         Assessment: 67yom to start heparin for CP/USA. Patient reports no bleeding and not taking any anticoagulants. Noted plans for possible cath on Monday. - No Baseline INR - will order - H/H and Plts wnl  Goal of Therapy:  Heparin level 0.3-0.7 units/ml Monitor platelets by anticoagulation protocol: Yes   Plan:  1. Heparin IV bolus 4000 units x1 2. Heparin 1300 units/hr (13 ml/hr) 3. Check heparin level 6 hours after initiation 4. Daily heparin level and CBC 5. INR now  Cleon Dew [phone] 05/24/2013,3:13 PM

## 2013-05-24 NOTE — ED Notes (Signed)
Patient denies any chest pain at this time and refuses GI Cocktail.

## 2013-05-24 NOTE — ED Notes (Signed)
Chest xray discontinued by verbal from Dr. Ranae Palms  Due to chest xray taken at [facility].

## 2013-05-24 NOTE — ED Notes (Signed)
Per EMS patient brought in from [facility] with 20 ga placed at UC in [facility]. Viagra x 48 hours ago. UC gave 325 mg aspirin x 45 minutes ago. Cath in 2007 and family hx of early MI. Patient has hx HTN, GERD, Asmetha. Patient reported mylase x 3 days with epigastric pain. 12 lead shows SR with first degree blockage.

## 2013-05-24 NOTE — Progress Notes (Signed)
  Subjective:    Patient ID: Kyle [name], male    DOB: 31-Mar-1946, 66 y.o.   MRN: [id number]  HPI patient states for the last 2 days he has felt chest tightness difficulty breathing a tight sensation in his upper abdomen and chest associated with some burning discomfort. He has had some shortness of breath and chest tightness he has used his inhaler for. He has been sweaty at times and today [place] walking [place] he became somewhat dizzy and had difficulty with ambulation.    Review of Systems     Objective:   Physical Exam patient is alert and cooperative however he is somewhat sweaty. His neck is supple. No carotid bruits are heard. His chest is clear to auscultation and percussion. His heart has a regular rate without murmurs. The abdomen is soft liver spleen not large there is no area of tenderness . EKG normal [facility] reading (PRIMARY) by  Dr. Cleta Alberts There is no acute disease seen there are degenerative changes at the rib attachment to the sternum. This looks similar to previous x-rays. Please comment on this area.b Results for orders placed in visit on 05/24/13  POCT CBC      Result Value Range   WBC 6.2  4.6 - 10.2 K/uL   Lymph, poc 1.7  0.6 - 3.4   POC LYMPH PERCENT 26.7  10 - 50 %L   MID (cbc) 0.5  0 - 0.9   POC MID % 8.4  0 - 12 %M   POC Granulocyte 4.0  2 - 6.9   Granulocyte percent 64.9  37 - 80 %G   RBC 4.66 (*) 4.69 - 6.13 M/uL   Hemoglobin 14.1  14.1 - 18.1 g/dL   HCT, POC 40.9  81.1 - 53.7 %   MCV 94.1  80 - 97 fL   MCH, POC 30.3  27 - 31.2 pg   MCHC 32.1  31.8 - 35.4 g/dL   RDW, POC 91.4     Platelet Count, POC 276  142 - 424 K/uL   MPV 7.6  0 - 99.8 fL         Assessment & Plan:  Patient has had a previous catheterization last I can see on the record is 2007 which showed nonobstructive coronary disease with 25-30% lesions. This was done by Dr. Riley Kill patient sent to the ER for troponins .

## 2013-05-24 NOTE — Progress Notes (Signed)
On-call Cardiology  Called by the nurse because patient requested a CT chest to evaluate his chest pain and rule out aortic dissection.  He was admitted by Dr. Daleen Squibb earlier for chest pain with a plan to undergo a cardiac cath on Monday.  He currently does not have any chest pain at this time, but he just wanted to know if there is a possibility that he has aortic dissection.  He does not have any family history of aortic aneurysm or dissection.  BP on right arm 130/75 and on left arm is 136/79. When I saw the patient, he was asymptomatic and is clinically and hemodynamically stable.  I told him that I am willing to order the CT chest to rule out aortic dissection, but we should be cautious about two dye loads since he will have a dye load for the CT chest and planned cardiac cath tentatively scheduled for Monday. Due to the closed proximity of the two dye loads, he said that he is not willing to accept the possibility of kidney injury, and he decided against getting CT of the chest.  Again, he is chest pain free at this time. We will continue current management and await cardiac cath on Monday.  Gemma Payor, MD

## 2013-05-25 LAB — LIPID PANEL
HDL: 59 mg/dL (ref >39)
LDL Cholesterol: 73 mg/dL (ref 0–99)
Total CHOL/HDL Ratio: 2.4 RATIO

## 2013-05-25 LAB — HEPARIN LEVEL (UNFRACTIONATED): Heparin Unfractionated: 0.44 IU/mL (ref 0.30–0.70)

## 2013-05-25 LAB — CBC
Hemoglobin: 13.7 g/dL (ref 13.0–17.0)
MCH: 31 pg (ref 26.0–34.0)
MCV: 86.9 fL (ref 78.0–100.0)
RBC: 4.42 MIL/uL (ref 4.22–5.81)

## 2013-05-25 MED ORDER — MAGNESIUM HYDROXIDE 400 MG/5ML PO SUSP
30.0000 mL | Freq: Every day | ORAL | Status: DC | PRN
  Administered 2013-05-25: 30 mL via ORAL
  Filled 2013-05-25 (×2): qty 30

## 2013-05-25 NOTE — Progress Notes (Signed)
Report from Night RN. Chart reviewed together. Handoff complete.Introductions complete. Will continue to monitor and advise attending as needed.   

## 2013-05-25 NOTE — Progress Notes (Signed)
ANTICOAGULATION CONSULT NOTE  Pharmacy Consult for Heparin Indication: chest pain/ACS  Allergies  Allergen Reactions  . Cefuroxime Axetil Anaphylaxis and Hives  . Celecoxib Anaphylaxis and Hives  . Nsaids Anaphylaxis and Hives    Pt takes aspirin every other day  . Penicillins Hives  . Sulfa Antibiotics Hives  . Clindamycin/Lincomycin Rash  . Doxycycline Rash    Patient Measurements: Height: 6' 4.5" (194.3 cm) Weight: 204 lb 2.3 oz (92.6 kg) IBW/kg (Calculated) : 87.95 Heparin Dosing Weight: 97.5kg  Vital Signs: Temp: 98.2 F (36.8 C) (07/26 2355) Temp src: Oral (07/26 2355) BP: 126/78 mmHg (07/26 2122) Pulse Rate: 60 (07/26 2122)  Labs:  Recent Labs  05/24/13 1034 05/24/13 1123 05/24/13 1238 05/24/13 1620 05/24/13 2113 05/24/13 2325  HGB 14.1  --  14.2  --   --   --   HCT 43.9  --  40.6  --   --   --   PLT  --   --  244  --   --   --   LABPROT  --   --   --   --   --  13.6  INR  --   --   --   --   --  1.06  HEPARINUNFRC  --   --   --   --   --  0.49  CREATININE  --  1.04 0.91  --   --   --   TROPONINI  --   --  <0.30 <0.30 <0.30  --     Estimated Creatinine Clearance: 99.4 ml/min (by C-G formula based on Cr of 0.91).  Assessment: 67 y.o. male with chest pain for heparin  Goal of Therapy:  Heparin level 0.3-0.7 units/ml Monitor platelets by anticoagulation protocol: Yes   Plan:  Continue Heparin at current rate  Geannie Risen, PharmD, BCPS  05/25/2013,12:26 AM

## 2013-05-25 NOTE — Progress Notes (Signed)
ANTICOAGULATION CONSULT NOTE - F/U  Pharmacy Consult for Heparin Indication: chest pain/ACS  Allergies  Allergen Reactions  . Cefuroxime Axetil Anaphylaxis and Hives  . Celecoxib Anaphylaxis and Hives  . Nsaids Anaphylaxis and Hives    Pt takes aspirin every other day  . Penicillins Hives  . Sulfa Antibiotics Hives  . Clindamycin/Lincomycin Rash  . Doxycycline Rash    Patient Measurements: Height: 6' 4.5" (194.3 cm) Weight: 204 lb 2.3 oz (92.6 kg) IBW/kg (Calculated) : 87.95 Heparin Dosing Weight: 92.6kg  Vital Signs: Temp: 98 F (36.7 C) (07/27 1703) Temp src: Oral (07/27 1703) BP: 143/88 mmHg (07/27 1703) Pulse Rate: 58 (07/27 1703)  Labs:  Recent Labs  05/24/13 1034 05/24/13 1123  05/24/13 1238 05/24/13 1620 05/24/13 2113 05/24/13 2325 05/25/13 0258 05/25/13 1634  HGB 14.1  --   --  14.2  --   --   --  13.7  --   HCT 43.9  --   --  40.6  --   --   --  38.4*  --   PLT  --   --   --  244  --   --   --  231  --   LABPROT  --   --   --   --   --   --  13.6  --   --   INR  --   --   --   --   --   --  1.06  --   --   HEPARINUNFRC  --   --   --   --   --   --  0.49 0.74* 0.44  CREATININE  --  1.04  --  0.91  --   --   --   --   --   TROPONINI  --   --   < > <0.30 <0.30 <0.30  --  <0.30  --   < > = values in this interval not displayed.  Estimated Creatinine Clearance: 99.4 ml/min (by C-G formula based on Cr of 0.91).   Medical History: Past Medical History  Diagnosis Date  . Asthma   . Hypertension     Medications:  Prescriptions prior to admission  Medication Sig Dispense Refill  . aspirin EC 81 MG tablet Take 81 mg by mouth every other day.       Marland Kitchen atorvastatin (LIPITOR) 40 MG tablet Take 20 mg by mouth daily.      . Azelastine HCl (ASTEPRO) 0.15 % SOLN Place 1 spray into the nose daily as needed (for allergies).      Marland Kitchen diltiazem (DILACOR XR) 240 MG 24 hr capsule Take 240 mg by mouth daily.      Marland Kitchen EPINEPHrine (EPIPEN) 0.3 mg/0.3 mL SOAJ Inject 0.3  mg into the muscle as needed (for allergic reactions).      . fluticasone (FLONASE) 50 MCG/ACT nasal spray Place 2 sprays into the nose daily as needed for rhinitis or allergies.      . metoprolol succinate (TOPROL-XL) 25 MG 24 hr tablet Take 25 mg by mouth 2 (two) times daily.      . mometasone (ASMANEX) 220 MCG/INH inhaler Inhale 2 puffs into the lungs daily.      Marland Kitchen omeprazole (PRILOSEC) 20 MG capsule Take 1 capsule (20 mg total) by mouth daily.  90 capsule  3  . zolpidem (AMBIEN) 10 MG tablet Take 10 mg by mouth at bedtime as needed for sleep.  Assessment: 67 y/o M on heparin for CP/ACS. HL is 0.44<0.74 at 1200 units/hr of heparin. Noted plans for possible cath on Monday. - H/H and Plts stable - Baseline INR 1.06 - No significant bleeding reported - CrCl ~ 100  Goal of Therapy:  Heparin level 0.3-0.7 units/ml Monitor platelets by anticoagulation protocol: Yes   Plan:  Continue heparin drip to 1200 units/hr (12 ml/hr) Check 6 hour confirmatory HL at 2300 Daily heparin level and CBC Monitor for bleeding  Thank you for allowing me to take part in this patient's care,  Abran Duke, PharmD Clinical Pharmacist Phone: [phone] Pager: [phone] 05/25/2013 5:14 PM

## 2013-05-25 NOTE — Progress Notes (Signed)
Patient ID: Dasean W [name], male   DOB: 11/20/1945, 67 y.o.   MRN: [id number]   Patient Name: Kyle [name] Date of Encounter: 05/25/2013    SUBJECTIVE  Recurrent pain last night not responding to 3 nitroglycerin. EKG unremarkable cardiac markers negative x4. He said he got some relief with GI cocktail. Hopefully this is all esophageal.  CURRENT MEDS . [START ON 05/26/2013] aspirin  324 mg Oral Pre-Cath  . [START ON 05/27/2013] aspirin EC  81 mg Oral Daily  . aspirin EC  81 mg Oral Daily  . atorvastatin  20 mg Oral q1800  . diltiazem  240 mg Oral Daily  . metoprolol succinate  25 mg Oral BID  . pantoprazole  40 mg Oral Daily  . sodium chloride  3 mL Intravenous Q12H    OBJECTIVE  Filed Vitals:   05/25/13 0400 05/25/13 0500 05/25/13 0600 05/25/13 0734  BP:    131/74  Pulse: 52 60 55 52  Temp:    98 F (36.7 C)  TempSrc:    Oral  Resp: 18 15 14 16  Height:      Weight:      SpO2: 95% 96% 94% 95%    Intake/Output Summary (Last 24 hours) at 05/25/13 0850 Last data filed at 05/25/13 0800  Gross per 24 hour  Intake 350.15 ml  Output   1425 ml  Net -1074.85 ml   Filed Weights   05/24/13 1500  Weight: 204 lb 2.3 oz (92.6 kg)    PHYSICAL EXAM  General: Pleasant, NAD. Neuro: Alert and oriented X 3. Moves all extremities spontaneously. Psych: Normal affect. HEENT:  Normal  Neck: Supple without bruits or JVD. Lungs:  Resp regular and unlabored, CTA. Heart: RRR no s3, s4, or murmurs. Abdomen: Soft, non-tender, non-distended, BS + x 4.  Extremities: No clubbing, cyanosis or edema. DP/PT/Radials 2+ and equal bilaterally.  Accessory Clinical Findings  CBC  Recent Labs  05/24/13 1238 05/25/13 0258  WBC 6.9 7.2  NEUTROABS 4.4  --   HGB 14.2 13.7  HCT 40.6 38.4*  MCV 87.9 86.9  PLT 244 231   Basic Metabolic Panel  Recent Labs  05/24/13 1123 05/24/13 1238  NA 136 136  K 4.2 4.2  CL 103 100  CO2 23 28  GLUCOSE 146* 133*  BUN 12 12  CREATININE 1.04  0.91  CALCIUM 8.8 8.8   Liver Function Tests  Recent Labs  05/24/13 1123 05/24/13 1238  AST 25 26  ALT 31 31  ALKPHOS 73 77  BILITOT 1.0 0.7  PROT 6.1 6.2  ALBUMIN 3.9 3.6    Recent Labs  05/24/13 1238  LIPASE 34   Cardiac Enzymes  Recent Labs  05/24/13 1620 05/24/13 2113 05/25/13 0258  TROPONINI <0.30 <0.30 <0.30   BNP No components found with this basename: POCBNP,  D-Dimer No results found for this basename: DDIMER,  in the last 72 hours Hemoglobin A1C No results found for this basename: HGBA1C,  in the last 72 hours Fasting Lipid Panel  Recent Labs  05/25/13 0258  CHOL 142  HDL 59  LDLCALC 73  TRIG 50  CHOLHDL 2.4   Thyroid Function Tests No results found for this basename: TSH, T4TOTAL, FREET3, T3FREE, THYROIDAB,  in the last 72 hours  TELE  Normal sinus rhythm  ECG  Normal sinus rhythm, no acute changes  Radiology/Studies  Dg Chest 2 View  05/24/2013   *RADIOLOGY REPORT*  Clinical Data: Cough and chest pain  CHEST -   2 VIEW  Comparison: July 19, 2010  Findings:  There is underlying emphysema.  There is no edema or consolidation.  Heart size and pulmonary vascularity are normal. No adenopathy.  There are multiple syndesmophytes  in the thoracic spine.  IMPRESSION:    Underlying emphysema.  No edema or consolidation. Question seronegative spondyloarthropathy.   Original Report Authenticated By: William Woodruff, M.D.    ASSESSMENT AND PLAN   We'll continue to treat this as unstable angina. Cardiac catheterization in the morning. Patient like a shower which I granted privileges to do that. We'll continue proton pump inhibitor. All questions answered.  Signed, Claudia Alvizo MD 

## 2013-05-25 NOTE — Progress Notes (Signed)
ANTICOAGULATION CONSULT NOTE - Initial Consult  Pharmacy Consult for Heparin Indication: chest pain/ACS  Allergies  Allergen Reactions  . Cefuroxime Axetil Anaphylaxis and Hives  . Celecoxib Anaphylaxis and Hives  . Nsaids Anaphylaxis and Hives    Pt takes aspirin every other day  . Penicillins Hives  . Sulfa Antibiotics Hives  . Clindamycin/Lincomycin Rash  . Doxycycline Rash    Patient Measurements: Height: 6' 4.5" (194.3 cm) Weight: 204 lb 2.3 oz (92.6 kg) IBW/kg (Calculated) : 87.95 Heparin Dosing Weight: 92.6kg  Vital Signs: Temp: 98.2 F (36.8 C) (07/26 2355) Temp src: Oral (07/26 2355) BP: 126/78 mmHg (07/26 2122) Pulse Rate: 55 (07/27 0600)  Labs:  Recent Labs  05/24/13 1034 05/24/13 1123  05/24/13 1238 05/24/13 1620 05/24/13 2113 05/24/13 2325 05/25/13 0258  HGB 14.1  --   --  14.2  --   --   --  13.7  HCT 43.9  --   --  40.6  --   --   --  38.4*  PLT  --   --   --  244  --   --   --  231  LABPROT  --   --   --   --   --   --  13.6  --   INR  --   --   --   --   --   --  1.06  --   HEPARINUNFRC  --   --   --   --   --   --  0.49 0.74*  CREATININE  --  1.04  --  0.91  --   --   --   --   TROPONINI  --   --   < > <0.30 <0.30 <0.30  --  <0.30  < > = values in this interval not displayed.  Estimated Creatinine Clearance: 99.4 ml/min (by C-G formula based on Cr of 0.91).   Medical History: Past Medical History  Diagnosis Date  . Asthma   . Hypertension     Medications:  Prescriptions prior to admission  Medication Sig Dispense Refill  . aspirin EC 81 MG tablet Take 81 mg by mouth every other day.       Marland Kitchen atorvastatin (LIPITOR) 40 MG tablet Take 20 mg by mouth daily.      . Azelastine HCl (ASTEPRO) 0.15 % SOLN Place 1 spray into the nose daily as needed (for allergies).      Marland Kitchen diltiazem (DILACOR XR) 240 MG 24 hr capsule Take 240 mg by mouth daily.      Marland Kitchen EPINEPHrine (EPIPEN) 0.3 mg/0.3 mL SOAJ Inject 0.3 mg into the muscle as needed (for allergic  reactions).      . fluticasone (FLONASE) 50 MCG/ACT nasal spray Place 2 sprays into the nose daily as needed for rhinitis or allergies.      . metoprolol succinate (TOPROL-XL) 25 MG 24 hr tablet Take 25 mg by mouth 2 (two) times daily.      . mometasone (ASMANEX) 220 MCG/INH inhaler Inhale 2 puffs into the lungs daily.      Marland Kitchen omeprazole (PRILOSEC) 20 MG capsule Take 1 capsule (20 mg total) by mouth daily.  90 capsule  3  . zolpidem (AMBIEN) 10 MG tablet Take 10 mg by mouth at bedtime as needed for sleep.        Assessment: 67yom on  heparin for CP/USA. Heparin level (0.74) is now supratherapeutic after increasing overnight - will decrease rate and  check follow-up level. Noted plans for possible cath on Monday. - H/H and Plts stable - Baseline INR 1.06 - No significant bleeding reported  Goal of Therapy:  Heparin level 0.3-0.7 units/ml Monitor platelets by anticoagulation protocol: Yes   Plan:  1. Decrease heparin drip to 1200 units/hr (12 ml/hr) 2. Check heparin level 6 hours after rate decrease 3. Daily heparin level and CBC  Cleon Dew [phone] 05/25/2013,7:39 AM

## 2013-05-26 ENCOUNTER — Encounter (HOSPITAL_COMMUNITY)
Admission: EM | Disposition: A | Discharge status: Home / Self Care | Payer: Self-pay | Source: Home / Self Care | Attending: Emergency Medicine

## 2013-05-26 ENCOUNTER — Other Ambulatory Visit: Payer: Self-pay

## 2013-05-26 DIAGNOSIS — R079 Chest pain, unspecified: Secondary | ICD-10-CM

## 2013-05-26 DIAGNOSIS — R072 Precordial pain: Principal | ICD-10-CM | POA: Diagnosis present

## 2013-05-26 HISTORY — PX: LEFT HEART CATHETERIZATION WITH CORONARY ANGIOGRAM: SHX5451

## 2013-05-26 LAB — CBC
MCHC: 35.1 g/dL (ref 30.0–36.0)
RDW: 13.2 % (ref 11.5–15.5)

## 2013-05-26 LAB — HEPARIN LEVEL (UNFRACTIONATED): Heparin Unfractionated: 0.43 IU/mL (ref 0.30–0.70)

## 2013-05-26 SURGERY — LEFT HEART CATHETERIZATION WITH CORONARY ANGIOGRAM
Anesthesia: LOCAL

## 2013-05-26 MED ORDER — FENTANYL CITRATE 0.05 MG/ML IJ SOLN
INTRAMUSCULAR | Status: AC
  Filled 2013-05-26: qty 2

## 2013-05-26 MED ORDER — LIDOCAINE HCL (PF) 1 % IJ SOLN
INTRAMUSCULAR | Status: AC
  Filled 2013-05-26: qty 30

## 2013-05-26 MED ORDER — HEPARIN (PORCINE) IN NACL 2-0.9 UNIT/ML-% IJ SOLN
INTRAMUSCULAR | Status: AC
  Filled 2013-05-26: qty 1000

## 2013-05-26 MED ORDER — MIDAZOLAM HCL 2 MG/2ML IJ SOLN
INTRAMUSCULAR | Status: AC
  Filled 2013-05-26: qty 2

## 2013-05-26 MED ORDER — VERAPAMIL HCL 2.5 MG/ML IV SOLN
INTRAVENOUS | Status: AC
  Filled 2013-05-26: qty 2

## 2013-05-26 MED ORDER — SODIUM CHLORIDE 0.9 % IV SOLN: 1.0000 mL/kg/h | INTRAVENOUS | Status: DC

## 2013-05-26 MED ORDER — HEPARIN SODIUM (PORCINE) 1000 UNIT/ML IJ SOLN
INTRAMUSCULAR | Status: AC
  Filled 2013-05-26: qty 1

## 2013-05-26 MED ORDER — OXYCODONE-ACETAMINOPHEN 5-325 MG PO TABS: 1.0000 | ORAL_TABLET | ORAL | Status: DC | PRN

## 2013-05-26 NOTE — Care Management Note (Signed)
    Page 1 of 1   05/26/2013     8:56:13 AM   CARE MANAGEMENT NOTE 05/26/2013  Patient:  Kyle [name], Kyle [name]   Account Number:  [id number]  Date Initiated:  05/26/2013  Documentation initiated by:  Junius Creamer  Subjective/Objective Assessment:   adm w ch pain     Action/Plan:   lives w wife   Anticipated DC Date:     Anticipated DC Plan:  HOME/SELF CARE      DC Planning Services  CM consult      Choice offered to / List presented to:             Status of service:   Medicare Important Message given?   (If response is "NO", the following Medicare IM given date fields will be blank) Date Medicare IM given:   Date Additional Medicare IM given:    Discharge Disposition:  HOME/SELF CARE  Per UR Regulation:  Reviewed for med. necessity/level of care/duration of stay  If discussed at Long Length of Stay Meetings, dates discussed:    Comments:

## 2013-05-26 NOTE — CV Procedure (Signed)
   Cardiac Catheterization Procedure Note  Name: Kyle [name] MRN: [id number] DOB: 05-15-1946  Procedure: Left Heart Cath, Selective Coronary Angiography, LV angiography  Indication: Chest pain, concern for USAP   Procedural Details: The right wrist was prepped, draped, and anesthetized with 1% lidocaine. Using the modified Seldinger technique, a 5 French sheath was introduced into the right radial artery. 3 mg of verapamil was administered through the sheath, weight-based unfractionated heparin was administered intravenously. Standard Judkins catheters were used for selective coronary angiography and left ventriculography. Catheter exchanges were performed over an exchange length guidewire. There were no immediate procedural complications. A TR band was used for radial hemostasis at the completion of the procedure.  The patient was transferred to the post catheterization recovery area for further monitoring.  Procedural Findings: Hemodynamics: AO 123/77 LV 121/20  Coronary angiography: Coronary dominance: right  Left mainstem: Calcified, widely patent with no obstructive disease.  Left anterior descending (LAD): This is a large vessel that reaches the left ventricular apex. There is proximal vessel ectasia. After the second diagonal, the vessel tapers to a normal caliber. There is diffuse irregularity throughout the proximal LAD with no areas of high-grade stenosis. The diagonal branches are patent without significant stenosis. There is minor diffuse irregularity noted.  Left circumflex (LCx): The left circumflex is moderate to large in caliber. The first obtuse marginal branch is patent with irregularity but no significant stenosis. The AV circumflex is widely patent. The second OM is patent without significant stenosis.  Right coronary artery (RCA): This is a large, dominant vessel. The vessel is patent throughout its course. The proximal vessel has irregularity. The mid vessel has an  area of 40% stenosis into the distal vessel. There is calcification throughout the proximal, mid, and distal RCA. The PDA and PLA branches are patent without significant stenosis. The first PLA branch is large in caliber.  Left ventriculography: Left ventricular systolic function is normal, LVEF is estimated at 55%, there is no significant mitral regurgitation   Aortic root angiogram: Normal size aortic root without evidence of dissection or aneurysm  Final Conclusions:   1. 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 Normal LV function 3. Normal aortic root  Recommendations: Medical therapy, suspect noncardiac chest pain.  Tonny Bollman 05/26/2013, 9:59 AM

## 2013-05-26 NOTE — Interval H&P Note (Signed)
History and Physical Interval Note:  05/26/2013 8:48 AM  Kyle [name]  has presented today for surgery, with the diagnosis of cp  The various methods of treatment have been discussed with the patient and family. After consideration of risks, benefits and other options for treatment, the patient has consented to  Procedure(s): LEFT HEART CATHETERIZATION WITH CORONARY ANGIOGRAM (N/A) as a surgical intervention .  The patient's history has been reviewed, patient examined, no change in status, stable for surgery.  I have reviewed the patient's chart and labs.  Questions were answered to the patient's satisfaction.    Cath Lab Visit (complete for each Cath Lab visit)  Clinical Evaluation Leading to the Procedure:   ACS: no  Non-ACS:    Anginal Classification: CCS III  Anti-ischemic medical therapy: Maximal Therapy (2 or more classes of medications)  Non-Invasive Test Results: No non-invasive testing performed  Prior CABG: No previous CABG         Tonny Bollman

## 2013-05-26 NOTE — H&P (View-Only) (Signed)
Patient ID: Kyle [name], male   DOB: 05-01-1946, 67 y.o.   MRN: [id number]   Patient Name: Kyle [name] Date of Encounter: 05/25/2013    SUBJECTIVE  Recurrent pain last night not responding to 3 nitroglycerin. EKG unremarkable cardiac markers negative x4. He said he got some relief with GI cocktail. Hopefully this is all esophageal.  CURRENT MEDS . [START ON 05/26/2013] aspirin  324 mg Oral Pre-Cath  . [START ON 05/27/2013] aspirin EC  81 mg Oral Daily  . aspirin EC  81 mg Oral Daily  . atorvastatin  20 mg Oral q1800  . diltiazem  240 mg Oral Daily  . metoprolol succinate  25 mg Oral BID  . pantoprazole  40 mg Oral Daily  . sodium chloride  3 mL Intravenous Q12H    OBJECTIVE  Filed Vitals:   05/25/13 0400 05/25/13 0500 05/25/13 0600 05/25/13 0734  BP:    131/74  Pulse: 52 60 55 52  Temp:    98 F (36.7 C)  TempSrc:    Oral  Resp: 18 15 14 16   Height:      Weight:      SpO2: 95% 96% 94% 95%    Intake/Output Summary (Last 24 hours) at 05/25/13 0850 Last data filed at 05/25/13 0800  Gross per 24 hour  Intake 350.15 ml  Output   1425 ml  Net -1074.85 ml   Filed Weights   05/24/13 1500  Weight: 204 lb 2.3 oz (92.6 kg)    PHYSICAL EXAM  General: Pleasant, NAD. Neuro: Alert and oriented X 3. Moves all extremities spontaneously. Psych: Normal affect. HEENT:  Normal  Neck: Supple without bruits or JVD. Lungs:  Resp regular and unlabored, CTA. Heart: RRR no s3, s4, or murmurs. Abdomen: Soft, non-tender, non-distended, BS + x 4.  Extremities: No clubbing, cyanosis or edema. DP/PT/Radials 2+ and equal bilaterally.  Accessory Clinical Findings  CBC  Recent Labs  05/24/13 1238 05/25/13 0258  WBC 6.9 7.2  NEUTROABS 4.4  --   HGB 14.2 13.7  HCT 40.6 38.4*  MCV 87.9 86.9  PLT 244 231   Basic Metabolic Panel  Recent Labs  05/24/13 1123 05/24/13 1238  NA 136 136  K 4.2 4.2  CL 103 100  CO2 23 28  GLUCOSE 146* 133*  BUN 12 12  CREATININE 1.04  0.91  CALCIUM 8.8 8.8   Liver Function Tests  Recent Labs  05/24/13 1123 05/24/13 1238  AST 25 26  ALT 31 31  ALKPHOS 73 77  BILITOT 1.0 0.7  PROT 6.1 6.2  ALBUMIN 3.9 3.6    Recent Labs  05/24/13 1238  LIPASE 34   Cardiac Enzymes  Recent Labs  05/24/13 1620 05/24/13 2113 05/25/13 0258  TROPONINI <0.30 <0.30 <0.30   BNP No components found with this basename: POCBNP,  D-Dimer No results found for this basename: DDIMER,  in the last 72 hours Hemoglobin A1C No results found for this basename: HGBA1C,  in the last 72 hours Fasting Lipid Panel  Recent Labs  05/25/13 0258  CHOL 142  HDL 59  LDLCALC 73  TRIG 50  CHOLHDL 2.4   Thyroid Function Tests No results found for this basename: TSH, T4TOTAL, FREET3, T3FREE, THYROIDAB,  in the last 72 hours  TELE  Normal sinus rhythm  ECG  Normal sinus rhythm, no acute changes  Radiology/Studies  Dg Chest 2 View  05/24/2013   *RADIOLOGY REPORT*  Clinical Data: Cough and chest pain  CHEST -  2 VIEW  Comparison: July 19, 2010  Findings:  There is underlying emphysema.  There is no edema or consolidation.  Heart size and pulmonary vascularity are normal. No adenopathy.  There are multiple syndesmophytes  in the thoracic spine.  IMPRESSION:    Underlying emphysema.  No edema or consolidation. Question seronegative spondyloarthropathy.   Original Report Authenticated By: Bretta Bang, M.D.    ASSESSMENT AND PLAN   We'll continue to treat this as unstable angina. Cardiac catheterization in the morning. Patient like a shower which I granted privileges to do that. We'll continue proton pump inhibitor. All questions answered.  Signed, Valera Castle MD

## 2013-05-26 NOTE — Discharge Summary (Signed)
CARDIOLOGY DISCHARGE SUMMARY   Patient ID: Kyle [name] MRN: [id number] DOB/AGE: 1946/08/27 67 y.o.  Admit date: 05/24/2013 Discharge date: 05/26/2013  Primary Discharge Diagnosis:   Precordial pain, no critical coronary artery disease at cath Secondary Discharge Diagnosis:   Procedures: Left heart cath, coronary arteriogram, LV angiography  Hospital Course: Kyle [name] is a 67 y.o. male with a history of nonobstructive CAD. He had recurrent chest pain that did not respond to sublingual nitroglycerin x3. He came to the ER and got some relief with a GI cocktail. He was admitted for further evaluation and treatment.  His cardiac enzymes were negative for MI. He has cardiac risk factors and his symptoms were very concerning so he was taken to the cath lab on 05/26/2013. The cardiac catheterization results are below. The cardiac catheterization showed nonobstructive disease and medical therapy was recommended. The symptoms were possibly GI in origin. He is already on omeprazole and can increase the dose to BID if he wishes.   On 05/26/2013, Dr. Perrin Maltese was ambulating well post-procedure and considered stable for discharge, to follow up as an outpatient with Dr. Daleen Squibb as needed.  Labs:   Lab Results  Component Value Date   WBC 6.6 05/26/2013   HGB 14.4 05/26/2013   HCT 41.0 05/26/2013   MCV 87.2 05/26/2013   PLT 231 05/26/2013     Recent Labs Lab 05/24/13 1238  NA 136  K 4.2  CL 100  CO2 28  BUN 12  CREATININE 0.91  CALCIUM 8.8  PROT 6.2  BILITOT 0.7  ALKPHOS 77  ALT 31  AST 26  GLUCOSE 133*    Recent Labs  05/24/13 1620 05/24/13 2113 05/25/13 0258  TROPONINI <0.30 <0.30 <0.30   Lipid Panel     Component Value Date/Time   CHOL 142 05/25/2013 0258   TRIG 50 05/25/2013 0258   HDL 59 05/25/2013 0258   CHOLHDL 2.4 05/25/2013 0258   VLDL 10 05/25/2013 0258   LDLCALC 73 05/25/2013 0258    Recent Labs  05/24/13 2325  INR 1.06      Radiology: Dg Chest 2  View 05/24/2013   *RADIOLOGY REPORT*  Clinical Data: Cough and chest pain  CHEST - 2 VIEW  Comparison: July 19, 2010  Findings:  There is underlying emphysema.  There is no edema or consolidation.  Heart size and pulmonary vascularity are normal. No adenopathy.  There are multiple syndesmophytes  in the thoracic spine.  IMPRESSION:    Underlying emphysema.  No edema or consolidation. Question seronegative spondyloarthropathy.   Original Report Authenticated By: Bretta Bang, M.D.   Cardiac Cath: 05/26/2013 Left mainstem: Calcified, widely patent with no obstructive disease.  Left anterior descending (LAD): This is a large vessel that reaches the left ventricular apex. There is proximal vessel ectasia. After the second diagonal, the vessel tapers to a normal caliber. There is diffuse irregularity throughout the proximal LAD with no areas of high-grade stenosis. The diagonal branches are patent without significant stenosis. There is minor diffuse irregularity noted.  Left circumflex (LCx): The left circumflex is moderate to large in caliber. The first obtuse marginal branch is patent with irregularity but no significant stenosis. The AV circumflex is widely patent. The second OM is patent without significant stenosis.  Right coronary artery (RCA): This is a large, dominant vessel. The vessel is patent throughout its course. The proximal vessel has irregularity. The mid vessel has an area of 40% stenosis into the distal vessel. There is calcification throughout  the proximal, mid, and distal RCA. The PDA and PLA branches are patent without significant stenosis. The first PLA branch is large in caliber.  Left ventriculography: Left ventricular systolic function is normal, LVEF is estimated at 55%, there is no significant mitral regurgitation  Aortic root angiogram: Normal size aortic root without evidence of dissection or aneurysm Final Conclusions:  1. Patent coronary arteries with mild diffuse  nonobstructive CAD  2. Normal LV function  3. Normal aortic root  EKG: Sinus bradycardia with a first degree AV block, no acute ischemic changes  FOLLOW UP PLANS AND APPOINTMENTS Allergies  Allergen Reactions  . Cefuroxime Axetil Anaphylaxis and Hives  . Celecoxib Anaphylaxis and Hives  . Nsaids Anaphylaxis and Hives    Pt takes aspirin every other day  . Penicillins Hives  . Sulfa Antibiotics Hives  . Clindamycin/Lincomycin Rash  . Doxycycline Rash     Medication List         aspirin EC 81 MG tablet  Take 81 mg by mouth every other day.     ASTEPRO 0.15 % Soln  Generic drug:  Azelastine HCl  Place 1 spray into the nose daily as needed (for allergies).     atorvastatin 40 MG tablet  Commonly known as:  LIPITOR  Take 20 mg by mouth daily.     diltiazem 240 MG 24 hr capsule  Commonly known as:  DILACOR XR  Take 240 mg by mouth daily.     EPIPEN 0.3 mg/0.3 mL Soaj  Generic drug:  EPINEPHrine  Inject 0.3 mg into the muscle as needed (for allergic reactions).     fluticasone 50 MCG/ACT nasal spray  Commonly known as:  FLONASE  Place 2 sprays into the nose daily as needed for rhinitis or allergies.     metoprolol succinate 25 MG 24 hr tablet  Commonly known as:  TOPROL-XL  Take 25 mg by mouth 2 (two) times daily.     mometasone 220 MCG/INH inhaler  Commonly known as:  ASMANEX  Inhale 2 puffs into the lungs daily.     omeprazole 20 MG capsule  Commonly known as:  PRILOSEC  Take 1 capsule (20 mg total) by mouth daily.     zolpidem 10 MG tablet  Commonly known as:  AMBIEN  Take 10 mg by mouth at bedtime as needed for sleep.        Discharge Orders   Future Orders Complete By Expires     Diet - low sodium heart healthy  As directed     Increase activity slowly  As directed       Follow-up Information   Follow up with Valera Castle, MD. (As needed)    Contact information:   [address] [place] [address] [phone]       BRING  ALL MEDICATIONS WITH YOU TO FOLLOW UP APPOINTMENTS  Time spent with patient to include physician time: 32 min Signed: Theodore Demark, PA-C 05/26/2013, 11:36 AM Co-Sign MD

## 2013-05-29 ENCOUNTER — Telehealth: Payer: Self-pay | Admitting: Emergency Medicine

## 2013-05-29 ENCOUNTER — Other Ambulatory Visit (INDEPENDENT_AMBULATORY_CARE_PROVIDER_SITE_OTHER): Payer: 59

## 2013-05-29 DIAGNOSIS — M791 Myalgia, unspecified site: Secondary | ICD-10-CM

## 2013-05-29 DIAGNOSIS — IMO0001 Reserved for inherently not codable concepts without codable children: Secondary | ICD-10-CM

## 2013-05-29 DIAGNOSIS — R51 Headache: Secondary | ICD-10-CM

## 2013-05-29 NOTE — Telephone Encounter (Signed)
Spoke with patient. Still feels bad. Will check tick titers.

## 2013-06-04 LAB — EHRLICHIA ANTIBODY PANEL
E chaffeensis (HGE) Ab, IgG: <1:64 {titer}
E chaffeensis (HGE) Ab, IgM: <1:20 {titer}

## 2013-06-12 ENCOUNTER — Encounter: Payer: Self-pay | Admitting: *Deleted

## 2013-06-16 ENCOUNTER — Other Ambulatory Visit: Payer: Self-pay | Admitting: Physician Assistant

## 2013-06-16 ENCOUNTER — Ambulatory Visit (INDEPENDENT_AMBULATORY_CARE_PROVIDER_SITE_OTHER): Payer: 59 | Admitting: Physician Assistant

## 2013-06-16 ENCOUNTER — Encounter: Payer: Self-pay | Admitting: Physician Assistant

## 2013-06-16 VITALS — BP 120/80 | HR 69 | Temp 98.9°F | Resp 16 | Ht 76.0 in | Wt 210.0 lb

## 2013-06-16 DIAGNOSIS — L821 Other seborrheic keratosis: Secondary | ICD-10-CM

## 2013-06-16 DIAGNOSIS — C4441 Basal cell carcinoma of skin of scalp and neck: Secondary | ICD-10-CM

## 2013-06-16 NOTE — Progress Notes (Signed)
Patient ID: Kyle [name] MRN: [id number], DOB: Feb 05, 1946, 67 y.o. Date of Encounter: 06/16/2013, 8:03 PM  Primary Physician: No primary provider on file.  Chief Complaint: Biopsies  HPI: 67 y.o. male with history below presents for multiple biopsies. Patient requests several biopsies of hyperpigmented lesions along his back, left shoulder, and a pearly lesion along his right temple. He also requests removal of a skin tag from his left axilla. He is uncertain is these lesions have changed as he cannot see them or just exactly how long they have been present. He is otherwise doing well.    Past Medical History  Diagnosis Date  . Asthma   . Hypertension   . Tubular adenoma 2006  . Coronary artery disease     non obstructive  . Diabetes      Home Meds: Prior to Admission medications   Medication Sig Start Date End Date Taking? Authorizing Provider  aspirin EC 81 MG tablet Take 81 mg by mouth every other day.    Yes Historical Provider, MD  atorvastatin (LIPITOR) 40 MG tablet Take 20 mg by mouth daily.   Yes Historical Provider, MD  Azelastine HCl (ASTEPRO) 0.15 % SOLN Place 1 spray into the nose daily as needed (for allergies).   Yes Historical Provider, MD  diltiazem (DILACOR XR) 240 MG 24 hr capsule Take 240 mg by mouth daily.   Yes Historical Provider, MD  EPINEPHrine (EPIPEN) 0.3 mg/0.3 mL SOAJ Inject 0.3 mg into the muscle as needed (for allergic reactions).   Yes Historical Provider, MD  fluticasone (FLONASE) 50 MCG/ACT nasal spray Place 2 sprays into the nose daily as needed for rhinitis or allergies.   Yes Historical Provider, MD  metoprolol succinate (TOPROL-XL) 25 MG 24 hr tablet Take 25 mg by mouth 2 (two) times daily.   Yes Historical Provider, MD  mometasone [facility]) 220 MCG/INH inhaler Inhale 2 puffs into the lungs daily.   Yes Historical Provider, MD  omeprazole (PRILOSEC) 20 MG capsule Take 1 capsule (20 mg total) by mouth daily. 08/25/12  Yes Heather M Marte, PA-C    zolpidem (AMBIEN) 10 MG tablet Take 10 mg by mouth at bedtime as needed for sleep.   Yes Historical Provider, MD    Allergies:  Allergies  Allergen Reactions  . Cefuroxime Axetil Anaphylaxis and Hives  . Celecoxib Anaphylaxis and Hives  . Nsaids Anaphylaxis and Hives    Pt takes aspirin every other day  . Penicillins Hives  . Sulfa Antibiotics Hives  . Clindamycin/Lincomycin Rash  . Doxycycline Rash    History   Social History  . Marital Status: Married    Spouse Name: N/A    Number of Children: N/A  . Years of Education: N/A   Occupational History  . Not on file.   Social History Main Topics  . Smoking status: Never Smoker   . Smokeless tobacco: Not on file  . Alcohol Use: 0.0 oz/week     Comment: 2 drinks per day  . Drug Use: No  . Sexual Activity: Not on file   Other Topics Concern  . Not on file   Social History Narrative  . No narrative on file     Review of Systems: Constitutional: negative for chills, fever, or fatigue  Dermatological: see above   Physical Exam: Blood pressure 120/80, pulse 69, temperature 98.9 F (37.2 C), temperature source Oral, resp. rate 16, height 6\' 4"  (1.93 m), weight 210 lb (95.255 kg), SpO2 96.00%., Body mass index is  25.57 kg/(m^2). General: Well developed, well nourished, in no acute distress. Head: Normocephalic, atraumatic, eyes without discharge, sclera non-icteric, nares are without discharge.   Neck: Supple. Full ROM.  Lungs: Breathing is unlabored. Heart: Regular rate. Msk:  Strength and tone normal for age. Extremities/Skin: Warm and dry. No clubbing or cyanosis. No edema. No rashes. Right temple: 1 cm circular pearly elevated palpable papule. Left axilla: Skin tag. Left shoulder: Hyperpigmented papule. Mid back: 3 hyperpigmented papules. Lower back: Slightly elevated hyperpigmented papule.  Neuro: Alert and oriented X 3. Moves all extremities spontaneously. Gait is normal. CNII-XII grossly in tact. Psych:   Responds to questions appropriately with a normal affect.     PROCEDURE NOTE: Verbal consent obtained. Sterile technique employed. Numbing: Local anesthesia obtained with 1cc of 1% lidocaine with epinephrine for each of the above lesions, except for the skin tag, where no anesthesia was used.  Betadine prep per usual protocol.  Shave biopsy taken from each of the above locations Skin tag removed.  Each of the biopsies were labeled and placed in pathology transport medium, labeled, and forms completed. Hemostasis obtained with silver nitrate. Wound cleansed and dressed. Wound care instructions including precautions covered with patient.    ASSESSMENT AND PLAN:  67 y.o. male with multiple biopsies  -Await pathology results -Wound care -Further treatment pending  Signed, Eula Listen, PA-C 06/16/2013 8:03 PM

## 2013-06-17 ENCOUNTER — Ambulatory Visit: Payer: Commercial Managed Care - PPO | Admitting: Internal Medicine

## 2013-06-18 ENCOUNTER — Telehealth: Payer: Self-pay

## 2013-06-18 NOTE — Telephone Encounter (Signed)
Spoke with christy at [facility] and cleared specimen source. It was L head (temoral)

## 2013-06-18 NOTE — Telephone Encounter (Signed)
[facility] has a question about a biopsy and which hand should be looked at, R or L? [phone] [phone]

## 2013-06-24 ENCOUNTER — Other Ambulatory Visit: Payer: Self-pay | Admitting: Physician Assistant

## 2013-07-20 ENCOUNTER — Other Ambulatory Visit: Payer: Self-pay | Admitting: Physician Assistant

## 2013-07-20 DIAGNOSIS — R059 Cough, unspecified: Secondary | ICD-10-CM

## 2013-07-20 DIAGNOSIS — R05 Cough: Secondary | ICD-10-CM

## 2013-07-20 MED ORDER — HYDROCODONE-ACETAMINOPHEN 7.5-325 MG/15ML PO SOLN: 15.0000 mL | Freq: Four times a day (QID) | ORAL | Status: DC | PRN

## 2013-07-20 NOTE — Progress Notes (Signed)
Patient comes in with 5-6 day history of cough. Has been seeing multiple people with similar illness. Has been washing his hands in between each patient. Requests refill of his cough syrup to help slow his cough so "everyone at home does not get sick." Requests Hycet. He will follow up if cough persists or worsens.   Eula Listen, PA-C 07/20/2013 5:20 PM

## 2013-07-27 ENCOUNTER — Ambulatory Visit (INDEPENDENT_AMBULATORY_CARE_PROVIDER_SITE_OTHER): Payer: 59 | Admitting: Physician Assistant

## 2013-07-27 VITALS — BP 118/76 | HR 64 | Temp 99.5°F | Resp 18 | Ht 76.0 in | Wt 210.0 lb

## 2013-07-27 DIAGNOSIS — Z23 Encounter for immunization: Secondary | ICD-10-CM

## 2013-07-27 DIAGNOSIS — H6120 Impacted cerumen, unspecified ear: Secondary | ICD-10-CM

## 2013-07-27 DIAGNOSIS — H6123 Impacted cerumen, bilateral: Secondary | ICD-10-CM

## 2013-07-27 NOTE — Progress Notes (Signed)
   [address], [place] [address]   Phone [phone]  Subjective:    Patient ID: Kyle [name], male    DOB: 1946/09/06, 67 y.o.   MRN: [id number]  HPI Pt here with concerns that he has bilateral cerumen impaction.  He is having trouble hearing and pain in his ears.  He has had this problem in the past.  He would also like to have a Flu vaccine while he is here.   Review of Systems     Objective:   Physical Exam  Vitals reviewed. Constitutional: He is oriented to person, place, and time. He appears well-developed and well-nourished.  HENT:  Right Ear: Hearing, tympanic membrane and external ear normal.  Left Ear: Hearing, tympanic membrane and external ear normal.  Bilateral cerumen impactions - lavage used to remove and afterwards ear canal WNL.  Pulmonary/Chest: Effort normal.  Neurological: He is alert and oriented to person, place, and time.  Skin: Skin is warm and dry.  Psychiatric: He has a normal mood and affect. His behavior is normal. Judgment and thought content normal.         Assessment & Plan:  Flu vaccine need - Plan: Flu Vaccine QUAD 36+ mos IM  Cerumen impaction, bilateral  Benny Lennert PA-C 07/27/2013 12:33 PM

## 2013-08-07 ENCOUNTER — Telehealth: Payer: Self-pay | Admitting: Radiology

## 2013-08-07 ENCOUNTER — Encounter: Payer: Self-pay | Admitting: Radiology

## 2013-08-07 DIAGNOSIS — M76829 Posterior tibial tendinitis, unspecified leg: Secondary | ICD-10-CM | POA: Insufficient documentation

## 2013-08-07 DIAGNOSIS — M76821 Posterior tibial tendinitis, right leg: Secondary | ICD-10-CM

## 2013-08-07 NOTE — Telephone Encounter (Signed)
Order provided for PTTD brace

## 2013-09-12 ENCOUNTER — Other Ambulatory Visit: Payer: Self-pay | Admitting: Physician Assistant

## 2013-09-15 ENCOUNTER — Other Ambulatory Visit: Payer: Self-pay | Admitting: Physician Assistant

## 2013-10-10 ENCOUNTER — Other Ambulatory Visit: Payer: Self-pay | Admitting: Physician Assistant

## 2013-10-10 NOTE — Telephone Encounter (Signed)
Dr Daub, do you want to give pt RFs? 

## 2013-10-10 NOTE — Telephone Encounter (Signed)
Dr Cleta Alberts, you have not seen this pt specifically for HTN recently. Do you want to give him RFs?

## 2013-10-29 ENCOUNTER — Other Ambulatory Visit: Payer: Self-pay | Admitting: Physician Assistant

## 2013-11-20 ENCOUNTER — Encounter (HOSPITAL_COMMUNITY): Payer: Self-pay | Admitting: Cardiology

## 2013-11-27 ENCOUNTER — Other Ambulatory Visit: Payer: Self-pay | Admitting: Emergency Medicine

## 2013-11-28 NOTE — Telephone Encounter (Signed)
Called in.

## 2013-12-05 ENCOUNTER — Other Ambulatory Visit: Payer: Self-pay | Admitting: Physician Assistant

## 2013-12-05 NOTE — Telephone Encounter (Signed)
Do you want to Rx for Dr Elder Cyphers?

## 2013-12-10 ENCOUNTER — Encounter: Payer: Self-pay | Admitting: Physician Assistant

## 2013-12-10 DIAGNOSIS — M19019 Primary osteoarthritis, unspecified shoulder: Secondary | ICD-10-CM | POA: Insufficient documentation

## 2013-12-18 ENCOUNTER — Other Ambulatory Visit: Payer: Self-pay | Admitting: Physician Assistant

## 2013-12-18 NOTE — Telephone Encounter (Signed)
Dr Everlene Farrier, we haven't seen pt for allergies for over a year, can we RF?

## 2014-01-11 ENCOUNTER — Ambulatory Visit (INDEPENDENT_AMBULATORY_CARE_PROVIDER_SITE_OTHER): Payer: 59 | Admitting: Family Medicine

## 2014-01-11 VITALS — BP 132/90 | HR 70 | Temp 98.5°F | Resp 16 | Ht 77.0 in | Wt 215.0 lb

## 2014-01-11 DIAGNOSIS — J209 Acute bronchitis, unspecified: Secondary | ICD-10-CM

## 2014-01-11 DIAGNOSIS — R059 Cough, unspecified: Secondary | ICD-10-CM

## 2014-01-11 DIAGNOSIS — R05 Cough: Secondary | ICD-10-CM

## 2014-01-11 MED ORDER — AZITHROMYCIN 250 MG PO TABS: ORAL_TABLET | ORAL | Status: DC

## 2014-01-11 MED ORDER — HYDROCODONE-ACETAMINOPHEN 7.5-325 MG/15ML PO SOLN: 15.0000 mL | Freq: Four times a day (QID) | ORAL | Status: DC | PRN

## 2014-01-11 NOTE — Progress Notes (Signed)
[facility] with 3-1/2 days of cough and occasional productive cough following his wife's similar illness. No fever or shortness of breath, no chest pain  Objective: No acute distress HEENT: Unremarkable Neck: Supple no adenopathy or thyromegaly Chest: Clear with exception of a few faint wheezes Heart: Regular no murmur Skin: Without rash or suspicious lesions  Assessment: Uncomplicated bronchitis  Plan:Cough - Plan: HYDROcodone-acetaminophen (HYCET) 7.5-325 mg/15 ml solution, azithromycin (ZITHROMAX Z-PAK) 250 MG tablet  Acute bronchitis  Signed, [name], MD

## 2014-01-11 NOTE — Patient Instructions (Signed)

## 2014-01-22 ENCOUNTER — Other Ambulatory Visit: Payer: Self-pay

## 2014-01-22 DIAGNOSIS — Z0000 Encounter for general adult medical examination without abnormal findings: Secondary | ICD-10-CM

## 2014-01-22 DIAGNOSIS — M810 Age-related osteoporosis without current pathological fracture: Secondary | ICD-10-CM

## 2014-01-22 DIAGNOSIS — E7800 Pure hypercholesterolemia, unspecified: Secondary | ICD-10-CM

## 2014-01-22 DIAGNOSIS — R739 Hyperglycemia, unspecified: Secondary | ICD-10-CM

## 2014-01-22 DIAGNOSIS — Z125 Encounter for screening for malignant neoplasm of prostate: Secondary | ICD-10-CM

## 2014-01-29 ENCOUNTER — Encounter: Payer: 59 | Admitting: Emergency Medicine

## 2014-02-05 ENCOUNTER — Ambulatory Visit: Payer: 59

## 2014-02-05 ENCOUNTER — Encounter: Payer: Self-pay | Admitting: Emergency Medicine

## 2014-02-05 ENCOUNTER — Other Ambulatory Visit: Payer: Self-pay | Admitting: Emergency Medicine

## 2014-02-05 ENCOUNTER — Ambulatory Visit (INDEPENDENT_AMBULATORY_CARE_PROVIDER_SITE_OTHER): Payer: 59 | Admitting: Emergency Medicine

## 2014-02-05 VITALS — BP 135/82 | HR 92 | Temp 97.7°F | Resp 16 | Ht 75.5 in | Wt 217.0 lb

## 2014-02-05 DIAGNOSIS — M542 Cervicalgia: Secondary | ICD-10-CM

## 2014-02-05 DIAGNOSIS — M25519 Pain in unspecified shoulder: Secondary | ICD-10-CM

## 2014-02-05 DIAGNOSIS — R51 Headache: Secondary | ICD-10-CM

## 2014-02-05 DIAGNOSIS — M25512 Pain in left shoulder: Secondary | ICD-10-CM

## 2014-02-05 DIAGNOSIS — M79609 Pain in unspecified limb: Secondary | ICD-10-CM

## 2014-02-05 DIAGNOSIS — IMO0002 Reserved for concepts with insufficient information to code with codable children: Secondary | ICD-10-CM

## 2014-02-05 DIAGNOSIS — I6523 Occlusion and stenosis of bilateral carotid arteries: Secondary | ICD-10-CM

## 2014-02-05 DIAGNOSIS — M76829 Posterior tibial tendinitis, unspecified leg: Secondary | ICD-10-CM

## 2014-02-05 DIAGNOSIS — R519 Headache, unspecified: Secondary | ICD-10-CM

## 2014-02-05 DIAGNOSIS — Z0000 Encounter for general adult medical examination without abnormal findings: Secondary | ICD-10-CM

## 2014-02-05 DIAGNOSIS — M792 Neuralgia and neuritis, unspecified: Secondary | ICD-10-CM

## 2014-02-05 LAB — IFOBT (OCCULT BLOOD): IMMUNOLOGICAL FECAL OCCULT BLOOD TEST: NEGATIVE

## 2014-02-05 NOTE — Progress Notes (Addendum)
@[email or web address] @  Patient ID: Kyle [name] MRN: [id number], DOB: 19-Mar-1946 68 y.o. Date of Encounter: 02/05/2014, 4:51 PM  Primary Physician: No primary provider on file.  Chief Complaint: Physical (CPE)  HPI: 68 y.o. y/o male with history noted below here for CPE.  Doing well. No issues/complaints.  Review of Systems:  Consitutional: No fever, chills, fatigue, night sweats, lymphadenopathy, or weight changes. Eyes: No visual changes, eye redness, or discharge. He had decreased vision in his left eye currently sees Dr. Katy Fitch ENT/Mouth: Ears: No otalgia, tinnitus, hearing loss, discharge. Nose: No congestion, rhinorrhea, sinus pain, or epistaxis. Throat: No sore throat, post nasal drip, or teeth pain. Cardiovascular: No CP,  diaphoresis, DOE, edema, orthopnea, PND. Patient had one episode of palpitations which lasted about 3 hours but was not associated with chest pain or shortness of breath he will discuss this with his cardiologist at his appointment. Respiratory: No cough, hemoptysis, SOB, or wheezing. Gastrointestinal: No anorexia, dysphagia, reflux, pain, nausea, vomiting, hematemesis, diarrhea, constipation, BRBPR, or melena. Genitourinary: No dysuria, frequency, urgency, hematuria, incontinence, nocturia, decreased urinary stream, discharge, impotence, or testicular pain/masses. Musculoskeletal: He has significant decreased range of motion of his neck. He has severe loss of motion in his left shoulder. 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 Rip Harbour on a regular basis. He has significant pain into his right shoulder but no weakness of the right arm. Skin: No rash, erythema, lesion changes, pain, warmth, jaundice, or pruritis. He recently had a good skin check with his daughter who is a Paediatric nurse. Neurological: No , dizziness, syncope, seizures, tremors, memory loss, coordination problems, or paresthesias. He has had a recent severe increased  headaches Psychological: No anxiety, depression, hallucinations, SI/HI. Endocrine: No fatigue, polydipsia, polyphagia, polyuria, or known diabetes. All other systems were reviewed and are otherwise negative.  Past Medical History  Diagnosis Date  . Asthma   . Hypertension   . Tubular adenoma 2006  . Coronary artery disease     non obstructive  . Diabetes      Past Surgical History  Procedure Laterality Date  . Total hip arthroplasty Left   . Heart catherization N/A 2007  . Salivary gland surgery  1999    Dr Thornell Mule    Home Meds:  Prior to Admission medications   Medication Sig Start Date End Date Taking? Authorizing Provider  ASMANEX 60 METERED DOSES 220 MCG/INH inhaler INHALE 2 PUFFS ONCE DAILY 06/24/13  Yes Heather M Marte, PA-C  aspirin EC 81 MG tablet Take 81 mg by mouth every other day.    Yes Historical Provider, MD  atorvastatin (LIPITOR) 40 MG tablet TAKE 1 TABLET BY MOUTH DAILY FOR CHOLESTEROL 06/24/13  Yes Heather M Marte, PA-C  Azelastine HCl 0.15 % SOLN USE 2 SPRAYS IN THE NOSE TWICE DAILY 09/12/13  Yes Theda Sers, PA-C  clobetasol ointment (TEMOVATE) 0.05 % APPLY TOPICALLY TWICE A DAY 10/29/13  Yes Ryan M Dunn, PA-C  diltiazem (CARDIZEM CD) 120 MG 24 hr capsule TAKE 1 CAPSULE BY MOUTH EVERY 12 HOURS. NEED OFFICE VISIT IN SEPTEMBER. 10/10/13  Yes Darlyne Russian, MD  EPINEPHrine (EPIPEN) 0.3 mg/0.3 mL SOAJ Inject 0.3 mg into the muscle as needed (for allergic reactions).   Yes Historical Provider, MD  fluticasone (FLONASE) 50 MCG/ACT nasal spray INSTILL 2 SPRAYS INTO NOSTRIL(S) ONCE A DAY 12/18/13  Yes Darlyne Russian, MD  metoprolol succinate (TOPROL-XL) 25 MG 24 hr tablet TAKE 1 TABLET BY MOUTH TWICE DAILY 10/10/13  Yes Darlyne Russian, MD  omeprazole (PRILOSEC) 20 MG capsule TAKE 1 CAPSULE BY MOUTH DAILY 09/15/13  Yes Eleanore E Egan, PA-C  predniSONE (DELTASONE) 20 MG tablet TAKE 1 TABLET BY MOUTH TWICE A DAY AS NEEDED FOR ARTHRITIS 12/05/13  Yes Rise Mu, PA-C   VENTOLIN HFA 108 (90 BASE) MCG/ACT inhaler USE AS DIRECTED 06/24/13  Yes Heather M Marte, PA-C  zolpidem (AMBIEN) 10 MG tablet TAKE 1 TABLET BY MOUTH AT BEDTIME 11/27/13  Yes Darlyne Russian, MD    Allergies:  Allergies  Allergen Reactions  . Cefuroxime Axetil Anaphylaxis and Hives  . Celecoxib Anaphylaxis and Hives  . Nsaids Anaphylaxis and Hives    Pt takes aspirin every other day  . Penicillins Hives  . Sulfa Antibiotics Hives  . Clindamycin/Lincomycin Rash  . Doxycycline Rash    History   Social History  . Marital Status: Married    Spouse Name: N/A    Number of Children: N/A  . Years of Education: N/A   Occupational History  . Not on file.   Social History Main Topics  . Smoking status: Never Smoker   . Smokeless tobacco: Not on file  . Alcohol Use: 0.0 oz/week     Comment: 2 drinks per day  . Drug Use: No  . Sexual Activity: Not on file   Other Topics Concern  . Not on file   Social History Narrative  . No narrative on file    Family History  Problem Relation Age of Onset  . Coronary artery disease      Physical Exam: Blood pressure 135/82, pulse 92, temperature 97.7 F (36.5 C), resp. rate 16, height 6' 3.5" (1.918 m), weight 217 lb (98.431 kg), SpO2 96.00%.  General: Well developed, well nourished, in no acute distress. HEENT: Normocephalic, atraumatic. Conjunctiva pink, sclera non-icteric. Pupils 2 mm constricting to 1 mm, round, regular, and equally reactive to light and accomodation. EOMI. Internal auditory canal clear. TMs with good cone of light and without pathology. Nasal mucosa pink. Nares are without discharge. No sinus tenderness. Oral mucosa pink. Dentition. Pharynx without exudate.   Neck: Supple. Trachea midline. No thyromegaly. Full ROM. No lymphadenopathy. Lungs: Clear to auscultation bilaterally without wheezes, rales, or rhonchi. Breathing is of normal effort and unlabored. Cardiovascular: RRR with S1 S2. No murmurs, rubs, or gallops  appreciated. Distal pulses 2+ symmetrically. No carotid or abdominal bruits. Abdomen: Soft, non-tender, non-distended with normoactive bowel sounds. No hepatosplenomegaly or masses. No rebound/guarding. No CVA tenderness. Without hernias.  Rectal: No external hemorrhoids or fissures. Rectal vault without masses.  Genitourinary:   circumcised male. No penile lesions. Testes descended bilaterally, and smooth without tenderness or masses.  Musculoskeletal: Full range of motion and 5/5 strength throughout. Without swelling, atrophy, tenderness, crepitus, or warmth. Extremities without clubbing, cyanosis, or edema. Calves supple. He has very limited flexion and extension of the neck. Reflexes the right arm are symmetrical. He may have some decreased strength of his right triceps appear examination of the left shoulder reveals significant inability to abduct the shoulder. He has very limited range of motion. Examination of both knees reveal severe degenerative changes bilaterally. Examination of both ankles reveal the patient to be flat-footed with limited range of motion of both ankles . Skin: Warm and moist without erythema, ecchymosis, wounds, or rash. Neuro: A+Ox3. CN II-XII grossly intact. Moves all extremities spontaneously. Full sensation throughout. Normal gait. DTR 2+ throughout upper and lower extremities. Finger to nose intact. Psych:  Responds to  questions appropriately with a normal affect.  [facility] reading (PRIMARY) by  Dr.Daub examination of the left shoulder reveal severe degenerative changes with loss of joint space. 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 y/o gentleman here for physical exam. His major problem is orthopedic related. He's had significant worsening of his headaches that are probably related to his severe neck disease. He does have some radicular symptoms into the right arm and we'll need  to proceed with the CT head and MRI of the neck. He had areas of actinic keratosis on his right forearm and left hand both of these were treated with liquid nitrogen and 8 seconds x2 -  Signed, Nena Jordan, MD 02/05/2014 4:51 PM

## 2014-02-12 ENCOUNTER — Ambulatory Visit
Admission: RE | Admit: 2014-02-12 | Discharge: 2014-02-12 | Disposition: A | Discharge status: Home / Self Care | Payer: 59 | Source: Ambulatory Visit | Attending: Emergency Medicine | Admitting: Emergency Medicine

## 2014-02-12 DIAGNOSIS — M542 Cervicalgia: Secondary | ICD-10-CM

## 2014-02-12 DIAGNOSIS — R519 Headache, unspecified: Secondary | ICD-10-CM

## 2014-02-12 DIAGNOSIS — M25512 Pain in left shoulder: Secondary | ICD-10-CM

## 2014-02-12 DIAGNOSIS — I6523 Occlusion and stenosis of bilateral carotid arteries: Secondary | ICD-10-CM

## 2014-02-12 DIAGNOSIS — M792 Neuralgia and neuritis, unspecified: Secondary | ICD-10-CM

## 2014-02-12 DIAGNOSIS — R51 Headache: Principal | ICD-10-CM

## 2014-03-02 ENCOUNTER — Other Ambulatory Visit: Payer: Self-pay | Admitting: Physician Assistant

## 2014-03-24 ENCOUNTER — Telehealth: Payer: Self-pay | Admitting: Family Medicine

## 2014-03-24 DIAGNOSIS — B009 Herpesviral infection, unspecified: Secondary | ICD-10-CM

## 2014-03-24 DIAGNOSIS — B029 Zoster without complications: Secondary | ICD-10-CM

## 2014-03-24 MED ORDER — VALACYCLOVIR HCL 1 G PO TABS: ORAL_TABLET | ORAL | Status: DC

## 2014-03-24 NOTE — Telephone Encounter (Signed)
Hx of fever blisters, usually takes valtrex 1 gram, but started with shingles outbreak on scalp 4 days ago - no eye or nose involvement. Just onto forehead. Has had zostavax,rash seems to be stable/improved, but is running out of valtrex as uses usually for infrequent cold sores.  Refilled Valtrex  To complete 7 day course of 1 gram tid, then for fever blister recurrence - 2 at once, then repeat dose once in 12 hours.  #30, 1 rf.

## 2014-04-04 ENCOUNTER — Other Ambulatory Visit: Payer: Self-pay | Admitting: Family Medicine

## 2014-04-04 DIAGNOSIS — R05 Cough: Secondary | ICD-10-CM

## 2014-04-04 DIAGNOSIS — R059 Cough, unspecified: Secondary | ICD-10-CM

## 2014-04-04 MED ORDER — HYDROCODONE-HOMATROPINE 5-1.5 MG/5ML PO SYRP: 5.0000 mL | ORAL_SOLUTION | Freq: Every evening | ORAL | Status: DC | PRN

## 2014-04-07 ENCOUNTER — Other Ambulatory Visit: Payer: Self-pay | Admitting: Physician Assistant

## 2014-04-07 DIAGNOSIS — N529 Male erectile dysfunction, unspecified: Secondary | ICD-10-CM

## 2014-04-07 MED ORDER — VIAGRA 100 MG PO TABS: ORAL_TABLET | ORAL | Status: DC

## 2014-05-04 ENCOUNTER — Other Ambulatory Visit: Payer: Self-pay | Admitting: Physician Assistant

## 2014-05-04 ENCOUNTER — Other Ambulatory Visit: Payer: Self-pay | Admitting: Emergency Medicine

## 2014-06-14 ENCOUNTER — Ambulatory Visit (INDEPENDENT_AMBULATORY_CARE_PROVIDER_SITE_OTHER): Payer: 59 | Admitting: Family Medicine

## 2014-06-14 ENCOUNTER — Ambulatory Visit (INDEPENDENT_AMBULATORY_CARE_PROVIDER_SITE_OTHER): Payer: 59

## 2014-06-14 ENCOUNTER — Encounter: Payer: Self-pay | Admitting: Family Medicine

## 2014-06-14 VITALS — BP 124/82 | HR 66 | Resp 16

## 2014-06-14 DIAGNOSIS — M25579 Pain in unspecified ankle and joints of unspecified foot: Secondary | ICD-10-CM

## 2014-06-14 DIAGNOSIS — S92309A Fracture of unspecified metatarsal bone(s), unspecified foot, initial encounter for closed fracture: Secondary | ICD-10-CM

## 2014-06-14 DIAGNOSIS — M25571 Pain in right ankle and joints of right foot: Secondary | ICD-10-CM

## 2014-06-14 DIAGNOSIS — S92352A Displaced fracture of fifth metatarsal bone, left foot, initial encounter for closed fracture: Secondary | ICD-10-CM

## 2014-06-14 MED ORDER — HYDROCODONE-ACETAMINOPHEN 5-325 MG PO TABS: 1.0000 | ORAL_TABLET | Freq: Four times a day (QID) | ORAL | Status: DC | PRN

## 2014-06-14 NOTE — Progress Notes (Signed)
Subjective:    Patient ID: Kyle [name], male    DOB: 10-20-1946, 68 y.o.   MRN: [id number] This chart was scribed for  Kyle Haber, MD by Steva Colder, ED Scribe. The patient was seen in room 9 at 10:12 AM.   Chief Complaint  Patient presents with  . Foot Pain    Slipped yesterday, right    HPI  HPI Comments: Kyle [name] is a 68 y.o. male who presents today complaining of right foot pain onset yesterday night.  He states that he struck his foot last night when he slipped on a scale. He states that he did not try any medications for the pain. He denies any other associated symptoms.    Patient Active Problem List   Diagnosis Date Noted  . 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 Posterior tibial tendonitis 08/07/2013  . Precordial pain 05/26/2013  . HYPERTENSION, UNSPECIFIED 10/19/2009  . DIABETES MELLITUS, TYPE II, CONTROLLED 10/15/2009  . HYPERLIPIDEMIA 10/15/2009  . CAD, NATIVE VESSEL 10/15/2009   Past Medical History  Diagnosis Date  . Asthma   . Hypertension   . Tubular adenoma 2006  . Coronary artery disease     non obstructive  . Diabetes    Past Surgical History  Procedure Laterality Date  . Total hip arthroplasty Left   . Heart catherization N/A 2007  . Salivary gland surgery  1999    Dr Thornell Mule   Allergies  Allergen Reactions  . Cefuroxime Axetil Anaphylaxis and Hives  . Celecoxib Anaphylaxis and Hives  . Nsaids Anaphylaxis and Hives    Pt takes aspirin every other day  . Penicillins Hives  . Sulfa Antibiotics Hives  . Clindamycin/Lincomycin Rash  . Doxycycline Rash   Prior to Admission medications   Medication Sig Start Date End Date Taking? Authorizing Provider  ASMANEX 60 METERED DOSES 220 MCG/INH inhaler INHALE 2 PUFFS ONCE DAILY 06/24/13  Yes Heather M Marte, PA-C  aspirin EC 81 MG tablet Take 81 mg by mouth every other day.    Yes Historical Provider, MD  atorvastatin (LIPITOR) 40 MG tablet TAKE 1 TABLET BY MOUTH DAILY FOR CHOLESTEROL 05/04/14   Yes Darlyne Russian, MD  Azelastine HCl 0.15 % SOLN USE 2 SPRAYS IN THE NOSE TWICE DAILY 09/12/13  Yes Theda Sers, PA-C  clobetasol ointment (TEMOVATE) 0.05 % APPLY TOPICALLY TWICE A DAY 10/29/13  Yes Ryan M Dunn, PA-C  diltiazem (CARDIZEM CD) 120 MG 24 hr capsule TAKE 1 CAPSULE BY MOUTH EVERY 12 HOURS. 05/04/14  Yes Darlyne Russian, MD  EPINEPHrine (EPIPEN) 0.3 mg/0.3 mL SOAJ Inject 0.3 mg into the muscle as needed (for allergic reactions).   Yes Historical Provider, MD  fluticasone (FLONASE) 50 MCG/ACT nasal spray INSTILL 2 SPRAYS INTO NOSTRIL(S) ONCE A DAY 12/18/13  Yes Darlyne Russian, MD  metoprolol succinate (TOPROL-XL) 25 MG 24 hr tablet TAKE 1 TABLET BY MOUTH TWICE DAILY 10/10/13  Yes Darlyne Russian, MD  omeprazole (PRILOSEC) 20 MG capsule TAKE 1 CAPSULE BY MOUTH DAILY 09/15/13  Yes Eleanore E Egan, PA-C  predniSONE (DELTASONE) 20 MG tablet TAKE 1 TABLET BY MOUTH TWICE A DAY AS NEEDED FOR ARTHRITIS 12/05/13  Yes Areta [name] Dunn, PA-C  valACYclovir (VALTREX) 1000 MG tablet As instructed. 03/24/14  Yes Wendie Agreste, MD  VENTOLIN HFA 108 [phone] BASE) MCG/ACT inhaler USE AS DIRECTED 06/24/13  Yes Heather M Marte, PA-C  VIAGRA 100 MG tablet 1 po daily for erectile dysfunction. 04/07/14  Yes Ryan  M Dunn, PA-C  zolpidem (AMBIEN) 10 MG tablet TAKE 1 TABLET BY MOUTH AT BEDTIME 11/27/13  Yes Darlyne Russian, MD    Review of Systems  Musculoskeletal: Positive for arthralgias (right foot pain).       Objective:   Physical Exam  Nursing note and vitals reviewed. Constitutional: He is oriented to person, place, and time. He appears well-developed and well-nourished. No distress.  HENT:  Head: Normocephalic and atraumatic.  Eyes: EOM are normal.  Neck: Neck supple. No tracheal deviation present.  Cardiovascular: Normal rate.   Pulmonary/Chest: Effort normal. No respiratory distress.  Musculoskeletal: Normal range of motion.  Neurological: He is alert and oriented to person, place, and time.  Skin: Skin is  warm and dry.  Psychiatric: He has a normal mood and affect. His behavior is normal.   Antalgic gait Ecchymotic dorsal lateral foot [facility] reading (PRIMARY) by  Dr. Joseph Art:  Long diagonal fifth metatarsal fx of proximal to mid shaft, left foot.          BP 124/82  Pulse 66  Resp 16  SpO2 97%  Assessment & Plan:  DIAGNOSTIC STUDIES: Oxygen Saturation is 97% on room air, normal by my interpretation.    COORDINATION OF CARE: 10:15 AM-Discussed treatment plan which includes CT scan of the right foot with pt at bedside and pt agreed to plan.    1. Pain in joint, ankle and foot, right     I personally performed the services described in this documentation, which was scribed in my presence. The recorded information has been reviewed and is accurate.  Pain in joint, ankle and foot, right - Plan: DG Foot Complete Right, Ambulatory referral to Orthopedic Surgery, HYDROcodone-acetaminophen (NORCO) 5-325 MG per tablet  Fracture of fifth metatarsal bone of left foot, closed, initial encounter - Plan: Ambulatory referral to Orthopedic Surgery, HYDROcodone-acetaminophen (NORCO) 5-325 MG per tablet Cam walker Signed, Kyle Haber, MD

## 2014-06-17 ENCOUNTER — Telehealth: Payer: Self-pay | Admitting: *Deleted

## 2014-06-17 NOTE — Telephone Encounter (Signed)
Dr. Everlene Farrier requested to have documentation in regards to signing a paper prescription for pt to have a Medical DV8 aluminum steerable knee walker crutch alternative coded: 592.352A Pt has a fractured 5th Metatarsal which requires non weight bearing to maximize chance for optimal healing and recover. This patient is unable to utilize crutches effectively or is unable to perfom task of daily living with crutches, but can do so with the knee walker.   This form and prescription have been sent to be scanned into the pt chart also.

## 2014-06-27 ENCOUNTER — Other Ambulatory Visit: Payer: Self-pay | Admitting: Family Medicine

## 2014-06-27 DIAGNOSIS — S92901A Unspecified fracture of right foot, initial encounter for closed fracture: Secondary | ICD-10-CM

## 2014-06-27 DIAGNOSIS — Z0000 Encounter for general adult medical examination without abnormal findings: Secondary | ICD-10-CM

## 2014-06-27 MED ORDER — PNEUMOCOCCAL 13-VAL CONJ VACC IM SUSP: 0.5000 mL | INTRAMUSCULAR | Status: DC

## 2014-06-30 ENCOUNTER — Ambulatory Visit (INDEPENDENT_AMBULATORY_CARE_PROVIDER_SITE_OTHER): Payer: 59

## 2014-06-30 ENCOUNTER — Other Ambulatory Visit: Payer: Self-pay | Admitting: Radiology

## 2014-06-30 ENCOUNTER — Ambulatory Visit (INDEPENDENT_AMBULATORY_CARE_PROVIDER_SITE_OTHER): Payer: 59 | Admitting: Family Medicine

## 2014-06-30 ENCOUNTER — Other Ambulatory Visit: Payer: Self-pay

## 2014-06-30 ENCOUNTER — Other Ambulatory Visit: Payer: Self-pay | Admitting: Family Medicine

## 2014-06-30 ENCOUNTER — Encounter: Payer: Self-pay | Admitting: Family Medicine

## 2014-06-30 DIAGNOSIS — Z7189 Other specified counseling: Secondary | ICD-10-CM

## 2014-06-30 DIAGNOSIS — S92901A Unspecified fracture of right foot, initial encounter for closed fracture: Secondary | ICD-10-CM

## 2014-06-30 DIAGNOSIS — S8290XD Unspecified fracture of unspecified lower leg, subsequent encounter for closed fracture with routine healing: Secondary | ICD-10-CM

## 2014-06-30 DIAGNOSIS — S92902S Unspecified fracture of left foot, sequela: Secondary | ICD-10-CM

## 2014-06-30 DIAGNOSIS — M25571 Pain in right ankle and joints of right foot: Secondary | ICD-10-CM

## 2014-06-30 DIAGNOSIS — M79609 Pain in unspecified limb: Secondary | ICD-10-CM

## 2014-06-30 DIAGNOSIS — S92901D Unspecified fracture of right foot, subsequent encounter for fracture with routine healing: Secondary | ICD-10-CM

## 2014-06-30 DIAGNOSIS — S8290XS Unspecified fracture of unspecified lower leg, sequela: Secondary | ICD-10-CM

## 2014-06-30 DIAGNOSIS — Z7185 Encounter for immunization safety counseling: Secondary | ICD-10-CM

## 2014-06-30 DIAGNOSIS — M79604 Pain in right leg: Secondary | ICD-10-CM

## 2014-06-30 MED ORDER — PNEUMOCOCCAL 13-VAL CONJ VACC IM SUSP: 0.5000 mL | INTRAMUSCULAR | Status: DC

## 2014-06-30 NOTE — Patient Instructions (Signed)
Pneumococcal Conjugate Vaccine: What You Need to Know  Your doctor recommends that you, or your child, get a dose of PCV13 today.  1. Why get vaccinated?  Pneumococcal conjugate vaccine (called PCV13 or Prevnar 13) is recommended to protect infants and toddlers, and some older children and adults with certain health conditions, from pneumococcal disease.  Pneumococcal disease is caused by infection with Streptococcus pneumoniae bacteria. These bacteria can spread from person to person through close contact.  Pneumococcal disease can lead to severe health problems, including pneumonia, blood infections, and meningitis.  Meningitis is an infection of the covering of the brain. Pneumococcal meningitis is fairly rare (less than 1 case per 100,000 people each year), but it leads to other health problems, including deafness and brain damage. In children, it is fatal in about 1 case out of 10.  Children younger than two are at higher risk for serious disease than older children.  People with certain medical conditions, people over age 65, and cigarette smokers are also at higher risk.  Before vaccine, pneumococcal infections caused many problems each year in the [place] in children younger than 5, including:  · more than 700 cases of meningitis,  · 13,000 blood infections,  · about 5 million ear infections, and  · about 200 deaths.  About 4,000 adults still die each year because of pneumococcal infections.  Pneumococcal infections can be hard to treat because some strains are resistant to antibiotics. This makes prevention through vaccination even more important.  2. PCV13 vaccine  There are more than 90 types of pneumococcal bacteria. PCV13 protects against 13 of them. These 13 strains cause most severe infections in children and about half of infections in adults.   PCV13 is routinely given to children at 2, 4, 6, and 12-15 months of age. Children in this age range are at greatest risk for serious diseases caused  by pneumococcal infection.  PCV13 vaccine may also be recommended for some older children or adults. Your doctor can give you details.  A second type of pneumococcal vaccine, called PPSV23, may also be given to some children and adults, including anyone over age 65. There is a separate Vaccine Information Statement for this vaccine.  3. Precautions   Anyone who has ever had a life-threatening allergic reaction to a dose of this vaccine, to an earlier pneumococcal vaccine called PCV7 (or Prevnar), or to any vaccine containing diphtheria toxoid (for example, DTaP), should not get PCV13.  Anyone with a severe allergy to any component of PCV13 should not get the vaccine. Tell your doctor if the person being vaccinated has any severe allergies.  If the person scheduled for vaccination is sick, your doctor might decide to reschedule the shot on another day.  Your doctor can give you more information about any of these precautions.  4. What are the risks of PCV13 vaccine?   With any medicine, including vaccines, there is a chance of side effects. These are usually mild and go away on their own, but serious reactions are also possible.  Reported problems associated with PCV13 vary by dose and age, but generally:  · About half of children became drowsy after the shot, had a temporary loss of appetite, or had redness or tenderness where the shot was given.  · About 1 out of 3 had swelling where the shot was given.  · About 1 out of 3 had a mild fever, and about 1 in 20 had a higher fever (over 102.2°F).  ·   Up to about 8 out of 10 became fussy or irritable.  Adults receiving the vaccine have reported redness, pain, and swelling where the shot was given. Mild fever, fatigue, headache, chills, or muscle pain have also been reported.  Life-threatening allergic reactions from any vaccine are very rare.  5. What if there is a serious reaction?  What should I look for?  · Look for anything that concerns you, such as signs of a  severe allergic reaction, very high fever, or behavior changes.  Signs of a severe allergic reaction can include hives, swelling of the face and throat, difficulty breathing, a fast heartbeat, dizziness, and weakness. These would start a few minutes to a few hours after the vaccination.  What should I do?  · If you think it is a severe allergic reaction or other emergency that can't wait, call 9-1-1 or get the person to the nearest hospital. Otherwise, call your doctor.  · Afterward, the reaction should be reported to the Vaccine Adverse Event Reporting System (VAERS). Your doctor might file this report, or you can do it yourself through the VAERS web site at www.vaers.hhs.gov, or by calling 1-800-822-7967.  VAERS is only for reporting reactions. They do not give medical advice.  6. The National Vaccine Injury Compensation Program  The National Vaccine Injury Compensation Program (VICP) is a federal program that was created to compensate people who may have been injured by certain vaccines.  Persons who believe they may have been injured by a vaccine can learn about the program and about filing a claim by calling 1-800-338-2382 or visiting the VICP website at www.hrsa.gov/vaccinecompensation.  7. How can I learn more?  · Ask your doctor.  · Call your local or state health department.  · Contact the Centers for Disease Control and Prevention (CDC):  ¨ Call 1-800-232-4636 (1-800-CDC-INFO) or  ¨ Visit CDC's website at www.cdc.gov/vaccines  CDC PCV13 Vaccine VIS (Interim) (12/27/11)  Document Released: 08/13/2006 Document Revised: 03/02/2014 Document Reviewed: 12/05/2013  ExitCare® Patient Information ©2015 [employer]. This information is not intended to replace advice given to you by your health care provider. Make sure you discuss any questions you have with your health care provider.

## 2014-06-30 NOTE — Progress Notes (Signed)
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 He's having less pain in his foot now. He's been using his Cam Walker faithfully and has had no further injuries to that right foot.  He's also requesting a Prevnar vaccine to be up to date on pneumonia prevention.  Objective: Patient is using a cane and has an antalgic gait, favoring the left lower extremity.  [facility] reading (PRIMARY) by  Dr. Joseph Art: Review of her films today on right foot suggests that there may be some early callus formation. This been no change in the position of the fracture. Closed fracture of right foot, with routine healing, subsequent encounter  Immunization counseling - Plan: pneumococcal 13-valent conjugate vaccine (PREVNAR 13) injection 0.5 mL  Signed, [name], MD    Signed, [name], MD

## 2014-07-20 ENCOUNTER — Other Ambulatory Visit: Payer: Self-pay | Admitting: Emergency Medicine

## 2014-07-20 DIAGNOSIS — N5201 Erectile dysfunction due to arterial insufficiency: Secondary | ICD-10-CM

## 2014-07-20 MED ORDER — VIAGRA 100 MG PO TABS: ORAL_TABLET | ORAL | Status: DC

## 2014-07-27 ENCOUNTER — Other Ambulatory Visit: Payer: Self-pay | Admitting: Emergency Medicine

## 2014-07-28 ENCOUNTER — Other Ambulatory Visit: Payer: Self-pay | Admitting: Family Medicine

## 2014-07-28 ENCOUNTER — Ambulatory Visit (INDEPENDENT_AMBULATORY_CARE_PROVIDER_SITE_OTHER): Payer: 59

## 2014-07-28 DIAGNOSIS — S92901D Unspecified fracture of right foot, subsequent encounter for fracture with routine healing: Secondary | ICD-10-CM

## 2014-07-28 DIAGNOSIS — IMO0001 Reserved for inherently not codable concepts without codable children: Secondary | ICD-10-CM

## 2014-08-03 ENCOUNTER — Ambulatory Visit (INDEPENDENT_AMBULATORY_CARE_PROVIDER_SITE_OTHER): Payer: 59

## 2014-08-03 DIAGNOSIS — Z23 Encounter for immunization: Secondary | ICD-10-CM

## 2014-09-07 ENCOUNTER — Encounter: Payer: Self-pay | Admitting: Family Medicine

## 2014-09-07 ENCOUNTER — Ambulatory Visit (INDEPENDENT_AMBULATORY_CARE_PROVIDER_SITE_OTHER): Payer: 59 | Admitting: Family Medicine

## 2014-09-07 DIAGNOSIS — H9203 Otalgia, bilateral: Secondary | ICD-10-CM

## 2014-09-07 DIAGNOSIS — H6123 Impacted cerumen, bilateral: Secondary | ICD-10-CM

## 2014-09-07 NOTE — Progress Notes (Signed)
Subjective: Patient here complaining of his ears being stuffy and painful.  Objective: Large amounts of cerumen irrigated from both ears. The TMs are normal.  Assessment: Otalgia Cerumen impaction, bilateral Dizziness secondary to above  Plan: No medication necessary after both ears were irrigated. If dizziness persists he will return.

## 2014-09-07 NOTE — Patient Instructions (Signed)
Return if further symptoms

## 2014-09-14 ENCOUNTER — Other Ambulatory Visit: Payer: Self-pay

## 2014-09-14 MED ORDER — EPINEPHRINE 0.3 MG/0.3ML IJ SOAJ: 0.3000 mg | INTRAMUSCULAR | Status: DC | PRN

## 2014-09-14 NOTE — Telephone Encounter (Signed)
Pharm sent req for RFs of epi-pen. Can we refill this?

## 2014-10-08 ENCOUNTER — Encounter (HOSPITAL_COMMUNITY): Payer: Self-pay | Admitting: Cardiovascular Disease

## 2014-10-08 ENCOUNTER — Other Ambulatory Visit: Payer: Self-pay | Admitting: Emergency Medicine

## 2014-10-08 NOTE — Telephone Encounter (Signed)
Called in.

## 2014-10-09 ENCOUNTER — Other Ambulatory Visit: Payer: Self-pay

## 2014-10-09 MED ORDER — OMEPRAZOLE 20 MG PO CPDR: 20.0000 mg | DELAYED_RELEASE_CAPSULE | Freq: Every day | ORAL | Status: DC

## 2014-10-09 NOTE — Telephone Encounter (Signed)
Pharm reqs RF of omeprazole. Dr Everlene Farrier, pt had annual in Apr, but don't see this med discussed recently. Didn't know if/how many RFs to give?

## 2014-10-29 ENCOUNTER — Other Ambulatory Visit: Payer: Self-pay | Admitting: Physician Assistant

## 2014-10-29 ENCOUNTER — Other Ambulatory Visit: Payer: Self-pay | Admitting: Family Medicine

## 2014-10-29 ENCOUNTER — Other Ambulatory Visit: Payer: Self-pay | Admitting: Emergency Medicine

## 2014-10-30 NOTE — Telephone Encounter (Signed)
Dr Everlene Farrier, how many RFs if any would you like to send in for Dr Elder Cyphers?

## 2014-10-31 NOTE — Telephone Encounter (Signed)
Refill one year

## 2014-11-02 ENCOUNTER — Other Ambulatory Visit: Payer: Self-pay

## 2014-11-02 MED ORDER — FLUTICASONE PROPIONATE 50 MCG/ACT NA SUSP: NASAL | Status: DC

## 2014-11-02 NOTE — Telephone Encounter (Signed)
Dr Elder Cyphers asked me to check on med RF reqs that were denied. It looks like 2 were denied by Ryan's new office bc they were sent there in error. Other was azithromycin he likes to keep on hand in case of illness. It looks like the only 2 Rx that were req'd that weren't RF'd are prednisone and azithro. Dr Everlene Farrier, do you want to give RFs of these, if so, how many? Dr Elder Cyphers stated that if you like, you may ask him about these. Dr Elder Cyphers also asked me to RF his flonase, which I have done.

## 2014-11-02 NOTE — Telephone Encounter (Signed)
I think it is fine to give a Zithromax prescription printed that she can keep on hand if she develops bronchitis but I do not want to give her prednisone. If she develops an illness requiring prednisone I would like her to talk to me.

## 2014-11-03 ENCOUNTER — Other Ambulatory Visit: Payer: Self-pay

## 2014-11-03 MED ORDER — PREDNISONE 20 MG PO TABS: ORAL_TABLET | ORAL | Status: DC

## 2014-11-03 MED ORDER — AZITHROMYCIN 250 MG PO TABS: ORAL_TABLET | ORAL | Status: DC

## 2014-11-03 MED ORDER — ASMANEX (60 METERED DOSES) 220 MCG/INH IN AEPB: INHALATION_SPRAY | RESPIRATORY_TRACT | Status: DC

## 2014-11-03 NOTE — Telephone Encounter (Signed)
Spoke to Dr Everlene Farrier because his note says he did not want to RF the prednisone, but he approved it, and didn't know whether he wanted me to cancel. He stated that when he realized that it was Dr Elder Cyphers himself that wanted it, he was comfortable Rxing it. Notified pt all were OKd.

## 2014-11-03 NOTE — Telephone Encounter (Signed)
Dr Everlene Farrier, got a req to RF Dr Luiz Ochoa asmanex also. Do you want to give 1 yr of RFs as you did with other meds?

## 2014-11-05 ENCOUNTER — Encounter: Payer: Self-pay | Admitting: Family Medicine

## 2014-11-05 ENCOUNTER — Ambulatory Visit (INDEPENDENT_AMBULATORY_CARE_PROVIDER_SITE_OTHER): Payer: 59 | Admitting: Family Medicine

## 2014-11-05 VITALS — BP 142/82 | HR 72 | Temp 98.6°F | Resp 16

## 2014-11-05 DIAGNOSIS — R059 Cough, unspecified: Secondary | ICD-10-CM

## 2014-11-05 DIAGNOSIS — R05 Cough: Secondary | ICD-10-CM

## 2014-11-05 MED ORDER — HYDROCODONE-ACETAMINOPHEN 7.5-325 MG/15ML PO SOLN: 15.0000 mL | Freq: Four times a day (QID) | ORAL | Status: DC | PRN

## 2014-11-05 MED ORDER — AZITHROMYCIN 500 MG PO TABS: 500.0000 mg | ORAL_TABLET | Freq: Every day | ORAL | Status: DC

## 2014-11-05 NOTE — Progress Notes (Signed)
Patient ID: Kyle [name] MRN: [id number], DOB: [birth year], 69 y.o. Date of Encounter: 11/05/2014, 12:21 PM  Primary Physician: No primary care provider on file.  Chief Complaint: No chief complaint on file.   HPI: 69 y.o. year old male presents with a 2 day history of nasal congestion, post nasal drip, sore throat, and cough. Mild sinus pressure. Afebrile. No chills. Nasal congestion thick and green/yellow. Cough is productive of green/yellow sputum and not associated with time of day. Ears feel full, leading to sensation of muffled hearing. Has tried OTC cold preps without success. No GI complaints.   No sick contacts, recent antibiotics, or recent travels.   No leg trauma, sedentary periods, h/o cancer, or tobacco use.  Past Medical History  Diagnosis Date  . Asthma   . Hypertension   . Tubular adenoma 2006  . Coronary artery disease     non obstructive  . Diabetes      Home Meds: Prior to Admission medications   Medication Sig Start Date End Date Taking? Authorizing Provider  ASMANEX 60 METERED DOSES 220 MCG/INH inhaler INHALE 2 PUFFS ONCE DAILY 11/03/14   Darlyne Russian, MD  aspirin EC 81 MG tablet Take 81 mg by mouth every other day.     Historical Provider, MD  atorvastatin (LIPITOR) 40 MG tablet TAKE 1 TABLET BY MOUTH DAILY FOR CHOLESTEROL 05/04/14   Darlyne Russian, MD  Azelastine HCl 0.15 % SOLN USE 2 SPRAYS IN THE NOSE TWICE DAILY 09/12/13   Theda Sers, PA-C  azithromycin (ZITHROMAX) 500 MG tablet Take 1 tablet (500 mg total) by mouth daily. 11/05/14   [name], MD  clobetasol ointment (TEMOVATE) 0.05 % APPLY TOPICALLY TWICE A DAY 10/31/14   Darlyne Russian, MD  diltiazem (CARDIZEM CD) 120 MG 24 hr capsule TAKE 1 CAPSULE BY MOUTH EVERY 12 HOURS. 05/04/14   Darlyne Russian, MD  EPINEPHrine 0.3 mg/0.3 mL IJ SOAJ injection Inject 0.3 mLs (0.3 mg total) into the muscle as needed (for allergic reactions). 09/14/14   Chelle S Jeffery, PA-C  fluticasone (FLONASE) 50 MCG/ACT nasal  spray INSTILL 2 SPRAYS INTO NOSTRIL ONCE DAILY 11/02/14   Darlyne Russian, MD  HYDROcodone-acetaminophen (HYCET) 7.5-325 mg/15 ml solution Take 15 mLs by mouth 4 (four) times daily as needed for moderate pain. 11/05/14 11/05/15  [name], MD  metoprolol succinate (TOPROL-XL) 25 MG 24 hr tablet TAKE 1 TABLET BY MOUTH TWICE DAILY 10/31/14   Darlyne Russian, MD  omeprazole (PRILOSEC) 20 MG capsule Take 1 capsule (20 mg total) by mouth daily. 10/09/14   Darlyne Russian, MD  predniSONE (DELTASONE) 20 MG tablet TAKE 1 TABLET BY MOUTH TWICE A DAY AS NEEDED FOR ARTHRITIS 11/03/14   Darlyne Russian, MD  valACYclovir (VALTREX) 1000 MG tablet TAKE BY MOUTH AS INSTRUCTED 10/30/14   Chelle Janalee Dane, PA-C  VENTOLIN HFA 108 (90 BASE) MCG/ACT inhaler USE AS DIRECTED 06/24/13   Heather M Marte, PA-C  VIAGRA 100 MG tablet 1 po daily for erectile dysfunction. 07/20/14   Darlyne Russian, MD  zolpidem (AMBIEN) 10 MG tablet TAKE 1 TABLET BY MOUTH AT BEDTIME 10/08/14   Darlyne Russian, MD    Allergies:  Allergies  Allergen Reactions  . Cefuroxime Axetil Anaphylaxis and Hives  . Celecoxib Anaphylaxis and Hives  . Nsaids Anaphylaxis and Hives    Pt takes aspirin every other day  . Penicillins Hives  . Sulfa Antibiotics Hives  . Clindamycin/Lincomycin Rash  .  Doxycycline Rash    History   Social History  . Marital Status: Married    Spouse Name: N/A    Number of Children: N/A  . Years of Education: N/A   Occupational History  . Not on file.   Social History Main Topics  . Smoking status: Never Smoker   . Smokeless tobacco: Not on file  . Alcohol Use: 0.0 oz/week     Comment: 2 drinks per day  . Drug Use: No  . Sexual Activity: Not on file   Other Topics Concern  . Not on file   Social History Narrative     Review of Systems: Constitutional: negative for chills, fever, night sweats or weight changes Cardiovascular: negative for chest pain or palpitations Respiratory: negative for hemoptysis, wheezing, or  shortness of breath Abdominal: negative for abdominal pain, nausea, vomiting or diarrhea Dermatological: negative for rash Neurologic: negative for headache   Physical Exam: Blood pressure 142/82, pulse 72, temperature 98.6 F (37 C), resp. rate 16., There is no weight on file to calculate BMI. General: Well developed, well nourished, in no acute distress. Head: Normocephalic, atraumatic, eyes without discharge, sclera non-icteric, nares are congested. Bilateral auditory canals clear, TM's are without perforation, pearly grey with reflective cone of light bilaterally. No sinus TTP. Oral cavity moist, dentition normal. Posterior pharynx with post nasal drip and mild erythema. No peritonsillar abscess or tonsillar exudate. Neck: Supple. No thyromegaly. Full ROM. No lymphadenopathy. Lungs: Coarse breath sounds bilaterally without wheezes, rales, or rhonchi. Breathing is unlabored.  Heart: RRR with S1 S2. No murmurs, rubs, or gallops appreciated. Msk:  Strength and tone normal for age. Extremities: No clubbing or cyanosis. No edema. Neuro: Alert and oriented X 3. Moves all extremities spontaneously. CNII-XII grossly in tact. Psych:  Responds to questions appropriately with a normal affect.     ASSESSMENT AND PLAN:  69 y.o. year old male with bronchitis. -   ICD-9-CM ICD-10-CM   1. Cough 786.2 R05 HYDROcodone-acetaminophen (HYCET) 7.5-325 mg/15 ml solution     azithromycin (ZITHROMAX) 500 MG tablet   -Tylenol/Motrin prn -Rest/fluids -RTC precautions -RTC 3-5 days if no improvement  Signed, [name], MD 11/05/2014 12:21 PM

## 2014-11-12 ENCOUNTER — Telehealth: Payer: Self-pay

## 2014-11-12 NOTE — Telephone Encounter (Signed)
PA needed for Asmanex. Called pt who doesn't remember being on any other inhaler in this class in the past, but he has been using this for over 10 yrs w/good effectiveness and no SEs. Pt is HIGHLY allergic to many medications in various classes of medications and we don't want him to switch and have possible serious SEs. Called and it can not be completed over the phone so they are faxing a form.

## 2014-11-27 NOTE — Telephone Encounter (Signed)
PA denied bc pt has not tried at least two of preferred alternatives: Flovent, QVAR, Pulmicort Flexhaler or Arnuity Ellipta. Notified Dr Elder Cyphers on mobile VM and asked him to [facility] w/choice of which he'd like to try and Dr Everlene Farrier will send it in for him.

## 2014-12-26 ENCOUNTER — Ambulatory Visit (INDEPENDENT_AMBULATORY_CARE_PROVIDER_SITE_OTHER): Payer: 59 | Admitting: Family Medicine

## 2014-12-26 VITALS — BP 142/84 | HR 66 | Temp 98.5°F | Resp 16 | Ht 77.0 in | Wt 221.0 lb

## 2014-12-26 DIAGNOSIS — R059 Cough, unspecified: Secondary | ICD-10-CM

## 2014-12-26 DIAGNOSIS — J209 Acute bronchitis, unspecified: Secondary | ICD-10-CM

## 2014-12-26 DIAGNOSIS — R05 Cough: Secondary | ICD-10-CM

## 2014-12-26 MED ORDER — AZITHROMYCIN 500 MG PO TABS: 500.0000 mg | ORAL_TABLET | Freq: Every day | ORAL | Status: DC

## 2014-12-26 MED ORDER — HYDROCODONE-ACETAMINOPHEN 7.5-325 MG/15ML PO SOLN: 15.0000 mL | Freq: Four times a day (QID) | ORAL | Status: DC | PRN

## 2014-12-26 NOTE — Progress Notes (Signed)
° °  Subjective:    Patient ID: Kyle [name], male    DOB: 1946-06-26, 69 y.o.   MRN: [id number] This chart was scribed for [name], MD by Zola Button, Medical Scribe. This patient was seen in Room 1 and the patient's care was started at 12:37 PM.   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 Patient reports having cough, HA, generalized myalgias, sore throat, mild SOB, fatigue, intermittent fever, and congestion. He notes that his symptoms worsened this morning - his cough was productive of greenish/brownish sputum and he reports increased fatigue and HA. His coughing is worse at night. He has been using a steam bath at home which has been helping. He also took a Tylenol this morning. Patient has not taken any antibiotics recently.     Review of Systems  Constitutional: Positive for fever and fatigue.  HENT: Positive for congestion and sore throat.   Respiratory: Positive for cough and shortness of breath.   Musculoskeletal: Positive for myalgias.  Neurological: Positive for headaches.       Objective:   Physical Exam CONSTITUTIONAL: Well developed/well nourished HEAD: Normocephalic/atraumatic EYES: EOM/PERRL ENMT: Mucous membranes moist, mildly erythematous posterior pharynx NECK: supple no meningeal signs SPINE: entire spine nontender CV: S1/S2 noted, no murmurs/rubs/gallops noted LUNGS: Lungs are clear to auscultation bilaterally, no apparent distress NEURO: Pt is awake/alert, moves all extremitiesx4 EXTREMITIES: pulses normal, full ROM SKIN: warm, color normal PSYCH: no abnormalities of mood noted     Assessment & Plan:   This chart was scribed in my presence and reviewed by me personally.    ICD-9-CM ICD-10-CM   1. Cough 786.2 R05 azithromycin (ZITHROMAX) 500 MG tablet     HYDROcodone-acetaminophen (HYCET) 7.5-325 mg/15 ml solution    2. Acute bronchitis, unspecified organism 466.0 J20.9 azithromycin (ZITHROMAX) 500 MG tablet     HYDROcodone-acetaminophen (HYCET) 7.5-325 mg/15 ml solution     Signed, [name], MD

## 2015-01-13 ENCOUNTER — Other Ambulatory Visit: Payer: Self-pay | Admitting: Emergency Medicine

## 2015-01-18 ENCOUNTER — Other Ambulatory Visit: Payer: Self-pay

## 2015-01-18 DIAGNOSIS — E785 Hyperlipidemia, unspecified: Secondary | ICD-10-CM

## 2015-01-18 DIAGNOSIS — E119 Type 2 diabetes mellitus without complications: Secondary | ICD-10-CM

## 2015-01-18 DIAGNOSIS — I10 Essential (primary) hypertension: Secondary | ICD-10-CM

## 2015-01-18 DIAGNOSIS — I25119 Atherosclerotic heart disease of native coronary artery with unspecified angina pectoris: Secondary | ICD-10-CM

## 2015-01-19 ENCOUNTER — Other Ambulatory Visit: Payer: Self-pay

## 2015-01-19 ENCOUNTER — Telehealth: Payer: Self-pay

## 2015-01-19 MED ORDER — VENTOLIN HFA 108 (90 BASE) MCG/ACT IN AERS: INHALATION_SPRAY | RESPIRATORY_TRACT | Status: DC

## 2015-01-19 NOTE — Telephone Encounter (Signed)
Pharm sent request for Flovent Rx since Asmanex is not covered and PA was not approved.

## 2015-01-19 NOTE — Telephone Encounter (Signed)
It is fine to change the prescription to Flovent but I could not find the prescription in the system. If you will forward it  to me I will sign it and get off to the pharmacy.

## 2015-01-20 ENCOUNTER — Ambulatory Visit (INDEPENDENT_AMBULATORY_CARE_PROVIDER_SITE_OTHER): Payer: 59 | Admitting: Emergency Medicine

## 2015-01-20 DIAGNOSIS — E119 Type 2 diabetes mellitus without complications: Secondary | ICD-10-CM | POA: Diagnosis not present

## 2015-01-20 DIAGNOSIS — I25119 Atherosclerotic heart disease of native coronary artery with unspecified angina pectoris: Secondary | ICD-10-CM

## 2015-01-20 DIAGNOSIS — I10 Essential (primary) hypertension: Secondary | ICD-10-CM

## 2015-01-20 DIAGNOSIS — E785 Hyperlipidemia, unspecified: Secondary | ICD-10-CM

## 2015-01-20 LAB — CBC
HEMATOCRIT: 42.8 % (ref 39.0–52.0)
Hemoglobin: 14.7 g/dL (ref 13.0–17.0)
MCH: 30.4 pg (ref 26.0–34.0)
MCHC: 34.3 g/dL (ref 30.0–36.0)
MCV: 88.4 fL (ref 78.0–100.0)
MPV: 8.2 fL — ABNORMAL LOW (ref 8.6–12.4)
Platelets: 323 10*3/uL (ref 150–400)
RBC: 4.84 MIL/uL (ref 4.22–5.81)
RDW: 13.5 % (ref 11.5–15.5)
WBC: 7.3 10*3/uL (ref 4.0–10.5)

## 2015-01-20 LAB — COMPLETE METABOLIC PANEL WITH GFR
ALBUMIN: 3.9 g/dL (ref 3.5–5.2)
ALK PHOS: 82 U/L (ref 39–117)
ALT: 25 U/L (ref 0–53)
AST: 20 U/L (ref 0–37)
BUN: 15 mg/dL (ref 6–23)
CO2: 25 mEq/L (ref 19–32)
CREATININE: 0.86 mg/dL (ref 0.50–1.35)
Calcium: 8.8 mg/dL (ref 8.4–10.5)
Chloride: 101 mEq/L (ref 96–112)
GFR, Est African American: >89 mL/min
GLUCOSE: 123 mg/dL — AB (ref 70–99)
POTASSIUM: 4.7 meq/L (ref 3.5–5.3)
Sodium: 134 mEq/L — ABNORMAL LOW (ref 135–145)
Total Bilirubin: 0.5 mg/dL (ref 0.2–1.2)
Total Protein: 6.1 g/dL (ref 6.0–8.3)

## 2015-01-20 LAB — LIPID PANEL
CHOL/HDL RATIO: 2.3 ratio
Cholesterol: 144 mg/dL (ref 0–200)
HDL: 64 mg/dL (ref >=40)
LDL CALC: 67 mg/dL (ref 0–99)
Triglycerides: 66 mg/dL (ref <150)
VLDL: 13 mg/dL (ref 0–40)

## 2015-01-20 LAB — HEMOGLOBIN A1C
Hgb A1c MFr Bld: 6.5 % — ABNORMAL HIGH (ref <5.7)
Mean Plasma Glucose: 140 mg/dL — ABNORMAL HIGH (ref <117)

## 2015-01-20 LAB — TSH: TSH: 2.832 u[IU]/mL (ref 0.350–4.500)

## 2015-01-20 MED ORDER — FLUTICASONE PROPIONATE HFA 220 MCG/ACT IN AERO: 1.0000 | INHALATION_SPRAY | Freq: Two times a day (BID) | RESPIRATORY_TRACT | Status: DC

## 2015-01-20 NOTE — Telephone Encounter (Signed)
Spoke to Dr Everlene Farrier to get details of which flovent inh to Rx and am putting in Rx as directed by VO.

## 2015-01-20 NOTE — Progress Notes (Signed)
Pt here for lab draw only  

## 2015-01-21 LAB — PSA: PSA: 1.05 ng/mL (ref <=4.00)

## 2015-01-22 ENCOUNTER — Ambulatory Visit (INDEPENDENT_AMBULATORY_CARE_PROVIDER_SITE_OTHER): Payer: 59 | Admitting: Emergency Medicine

## 2015-01-22 VITALS — BP 128/80 | HR 65 | Temp 98.4°F | Resp 16 | Ht 77.0 in | Wt 220.0 lb

## 2015-01-22 DIAGNOSIS — I10 Essential (primary) hypertension: Secondary | ICD-10-CM

## 2015-01-22 DIAGNOSIS — Z1211 Encounter for screening for malignant neoplasm of colon: Secondary | ICD-10-CM | POA: Diagnosis not present

## 2015-01-22 DIAGNOSIS — E119 Type 2 diabetes mellitus without complications: Secondary | ICD-10-CM

## 2015-01-22 DIAGNOSIS — R011 Cardiac murmur, unspecified: Secondary | ICD-10-CM

## 2015-01-22 LAB — POCT URINALYSIS DIPSTICK
BILIRUBIN UA: NEGATIVE
Glucose, UA: NEGATIVE
KETONES UA: NEGATIVE
Leukocytes, UA: NEGATIVE
Nitrite, UA: NEGATIVE
PH UA: 6
PROTEIN UA: NEGATIVE
SPEC GRAV UA: 1.02
Urobilinogen, UA: 0.2

## 2015-01-22 LAB — POCT UA - MICROSCOPIC ONLY
BACTERIA, U MICROSCOPIC: NEGATIVE
Casts, Ur, LPF, POC: NEGATIVE
Crystals, Ur, HPF, POC: NEGATIVE
MUCUS UA: NEGATIVE
Yeast, UA: NEGATIVE

## 2015-01-22 LAB — IFOBT (OCCULT BLOOD): IMMUNOLOGICAL FECAL OCCULT BLOOD TEST: NEGATIVE

## 2015-01-22 LAB — MICROALBUMIN, URINE: Microalb, Ur: 0.5 mg/dL (ref <2.0)

## 2015-01-22 MED ORDER — AZELASTINE HCL 0.15 % NA SOLN: 2.0000 | Freq: Two times a day (BID) | NASAL | Status: DC

## 2015-01-22 MED ORDER — SILDENAFIL CITRATE 20 MG PO TABS: ORAL_TABLET | ORAL | Status: DC

## 2015-01-22 MED ORDER — METOPROLOL SUCCINATE ER 50 MG PO TB24: 50.0000 mg | ORAL_TABLET | Freq: Every day | ORAL | Status: DC

## 2015-01-22 MED ORDER — DILTIAZEM HCL ER COATED BEADS 240 MG PO CP24: 240.0000 mg | ORAL_CAPSULE | Freq: Every day | ORAL | Status: DC

## 2015-01-22 NOTE — Progress Notes (Signed)
Subjective:    Patient ID: Kyle [name], male    DOB: 04-Oct-1946, 69 y.o.   MRN: [id number]  HPI patient comes in for general checkup. He overall has been doing well. He has been trying to watch his diet regarding his diabetes. He had a recent episode of pneumonia and was treated with antibiotics with improvement. He is under the care of Dr. Rip Harbour for multiple orthopedic issues. His blood pressure has been an issue recently. He is currently taking Cardizem CD 120 2 in the morning and Toprol-XL 25 2 at night.    Review of Systems  Constitutional: Negative for activity change and fatigue.  HENT: Negative.   Eyes: Negative.   Respiratory:       Recent episode of pneumonia now improved  Cardiovascular: Negative for chest pain, palpitations and leg swelling.  Gastrointestinal: Negative.   Endocrine:       Patient has a history diabetes currently watching his diet not on medications.  Genitourinary: Positive for frequency and difficulty urinating.  Neurological: Negative.   Psychiatric/Behavioral: Negative.        Objective:   Physical Exam  Constitutional: He appears well-developed and well-nourished.  HENT:  Head: Normocephalic.  Eyes: Pupils are equal, round, and reactive to light.  Neck: Normal range of motion. No tracheal deviation present. No thyromegaly present.  Cardiovascular:  Patient has a regular rate and rhythm there is a grade 1-2 systolic murmur at the apex of the heart.  Pulmonary/Chest: Effort normal and breath sounds normal.  Abdominal: Soft. There is no tenderness. There is no rebound.  Genitourinary: Prostate normal.  Musculoskeletal:  Patient has significant arthritic changes of the hands knees and shoulders he is status post left hip replacement  Neurological: He is alert. No cranial nerve deficit. Coordination normal.  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 His behavior is  normal. Judgment and thought content normal.   Results for orders placed or performed in visit on 01/22/15  IFOBT POC (occult bld, rslt in office)  Result Value Ref Range   IFOBT Negative   POCT urinalysis dipstick  Result Value Ref Range   Color, UA yellow    Clarity, UA clear    Glucose, UA neg    Bilirubin, UA neg    Ketones, UA neg    Spec Grav, UA 1.020    Blood, UA trace    pH, UA 6.0    Protein, UA neg    Urobilinogen, UA 0.2    Nitrite, UA neg    Leukocytes, UA Negative   POCT UA - Microscopic Only  Result Value Ref Range   WBC, Ur, HPF, POC 0-1    RBC, urine, microscopic 0-3    Bacteria, U Microscopic neg    Mucus, UA neg    Epithelial cells, urine per micros 0-2    Crystals, Ur, HPF, POC neg    Casts, Ur, LPF, POC neg    Yeast, UA neg    Meds ordered this encounter  Medications  . Azelastine HCl (ASTEPRO) 0.15 % SOLN    Sig: Place 2 sprays into the nose 2 (two) times daily.    Dispense:  90 mL    Refill:  3  . sildenafil (REVATIO) 20 MG tablet    Sig: Take 2 to 3 one hour prior to intercourse    Dispense:  50 tablet    Refill:  11  . metoprolol succinate (TOPROL-XL) 50 MG 24 hr tablet    Sig: Take 1 tablet (50 mg total) by mouth daily.    Dispense:  90 tablet    Refill:  3  . diltiazem (CARDIZEM CD) 240 MG 24 hr capsule    Sig: Take 1 capsule (240 mg total) by mouth daily.    Dispense:  90 capsule    Refill:  3         Assessment & Plan:  Hemoglobin A1c was up to 6.5. Blood pressure is perfect on his current regimen which is Cardizem CD 240 1 in the morning and Toprol-XL 50 at night. I did refill his medications and changed him to brand name Astepro. His Lipitor was decreased due to his treatment with diltiazem. His hemoglobin A1c is up to 6.5. He is willing to work harder on his diet try and lose 5 pounds and repeat his A1c in 4 months. He knows he needs to follow-up with the ophthalmologist.

## 2015-03-03 ENCOUNTER — Telehealth: Payer: Self-pay | Admitting: Emergency Medicine

## 2015-03-03 ENCOUNTER — Other Ambulatory Visit: Payer: Self-pay | Admitting: Emergency Medicine

## 2015-03-03 MED ORDER — METHOCARBAMOL 750 MG PO TABS: 750.0000 mg | ORAL_TABLET | Freq: Three times a day (TID) | ORAL | Status: DC | PRN

## 2015-03-03 NOTE — Telephone Encounter (Signed)
Patient having a lot of pain in his neck and shoulders. We'll treated with Robaxin 750 every 8 hours.

## 2015-03-09 ENCOUNTER — Telehealth: Payer: Self-pay | Admitting: Emergency Medicine

## 2015-03-09 ENCOUNTER — Other Ambulatory Visit: Payer: Self-pay | Admitting: Emergency Medicine

## 2015-03-09 DIAGNOSIS — M545 Low back pain, unspecified: Secondary | ICD-10-CM

## 2015-03-09 NOTE — Telephone Encounter (Signed)
I received a phone call from the patient. Patient having severe back spasm. His pain is in his mid back. He denies any radicular symptoms. He currently has a muscle relaxant at home and does have some hydrocodone at home. He is artery taken a dose of each of these. He has been to Dr. Rip Harbour for orthopedic issues. He is due to see Dr. Amedeo Plenty on Thursday for right arm problems. He will call in a few eyes and let me know his status. He states he has had films of his back but I cannot find any x-rays in the system. I will ask to look at his paper chart.

## 2015-03-21 ENCOUNTER — Other Ambulatory Visit: Payer: Self-pay | Admitting: Family Medicine

## 2015-03-21 DIAGNOSIS — R059 Cough, unspecified: Secondary | ICD-10-CM

## 2015-03-21 DIAGNOSIS — R05 Cough: Secondary | ICD-10-CM

## 2015-03-21 MED ORDER — HYDROCODONE-ACETAMINOPHEN 7.5-325 MG/15ML PO SOLN: 10.0000 mL | Freq: Four times a day (QID) | ORAL | Status: DC | PRN

## 2015-03-21 NOTE — Progress Notes (Signed)
This patient has been having horrible cough which has resulted in lost work because of back strain. Would like a refill on his cough medicine until this problems sides.

## 2015-03-24 ENCOUNTER — Other Ambulatory Visit: Payer: Self-pay | Admitting: Urgent Care

## 2015-03-24 DIAGNOSIS — S39012A Strain of muscle, fascia and tendon of lower back, initial encounter: Secondary | ICD-10-CM

## 2015-03-24 MED ORDER — BACK SUPPORT L/XL MISC: 1.0000 [IU] | Freq: Every day | Status: DC | PRN

## 2015-03-24 NOTE — Progress Notes (Signed)
Patient is presenting for recurrent low back pain, has previously used a flexible wrap to help with this. He was exercising while this pain recurred and feels exactly the same as previous lumbar strains. Patient is requesting a rx refill for his back wrap. Will provide printed script, return to clinic if this back pain does not resolve in 1-2 weeks. For now, working diagnosis is lumbar strain.  Jaynee Eagles, PA-C [facility] [phone] 03/24/2015  4:33 PM

## 2015-03-25 ENCOUNTER — Encounter: Payer: Self-pay | Admitting: Family Medicine

## 2015-03-25 ENCOUNTER — Ambulatory Visit (INDEPENDENT_AMBULATORY_CARE_PROVIDER_SITE_OTHER): Payer: 59 | Admitting: Family Medicine

## 2015-03-25 VITALS — BP 139/76 | HR 77 | Temp 98.0°F | Resp 16 | Ht 75.5 in | Wt 225.2 lb

## 2015-03-25 DIAGNOSIS — M545 Low back pain: Secondary | ICD-10-CM

## 2015-03-25 MED ORDER — METHOCARBAMOL 750 MG PO TABS: 750.0000 mg | ORAL_TABLET | Freq: Three times a day (TID) | ORAL | Status: AC | PRN

## 2015-03-25 MED ORDER — OXYCODONE-ACETAMINOPHEN 5-325 MG PO TABS: 1.0000 | ORAL_TABLET | Freq: Three times a day (TID) | ORAL | Status: DC | PRN

## 2015-03-25 MED ORDER — PREDNISONE 20 MG PO TABS: ORAL_TABLET | ORAL | Status: DC

## 2015-03-25 NOTE — Progress Notes (Signed)
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  Pain became excruciating without radiation, made worse by getting out of chair or twisting to the left.  No fever, bowel or bladder sx  Patient took a remaining oxycodone to get through the night.  This problem is similar to pain experienced three weeks ago (after overstretching his right hip in a training session)  and treated by Dr. Nelva Bush with an injection into facet of L4L5.  Pain seemed to be improving at that time, and patient was doing reasonably well until the golf swing.  S/P left hip replacement.  Patient has trainer and original back injury  Objective:  BP 139/76 mmHg  Pulse 77  Temp(Src) 98 F (36.7 C) (Oral)  Resp 16  Ht 6' 3.5" (1.918 m)  Wt 225 lb 3.2 oz (102.15 kg)  BMI 27.77 kg/m2  SpO2 96% NAD, walking with cane Back exam: Normal inspection with feeding ecchymosis over the right superior iliac crest and paralumbar area. He's tender over the lateral iliac crest on the right. Straight leg raising is negative. Abdomen is soft and nontender Skin: No rash Reflexes: Trace knee jerk bilaterally, trace ankle jerk  Assessment: Second episode of acute back strain precipitated by playing golf this time. I feel like the most likely etiology now is muscular strain. He's already feeling better than he did last night. There is no signs of neurological injury.      ICD-9-CM ICD-10-CM   1. Right low back pain, with sciatica presence unspecified 724.2 M54.5 predniSONE (DELTASONE) 20 MG tablet     methocarbamol (ROBAXIN) 750 MG tablet     oxyCODONE-acetaminophen (ROXICET) 5-325 MG per tablet     Elevated toilet seat     Walker standard     MR Lumbar Spine Wo Contrast   Face pillow also prescribed. We also discussed narcotics. Patient is adamant that he has not overusing any opiate. Patient will be contacting his primary care orthopedic  physician: Dr. Jacelyn Grip  Signed, [name], MD

## 2015-03-26 ENCOUNTER — Ambulatory Visit
Admission: RE | Admit: 2015-03-26 | Discharge: 2015-03-26 | Disposition: A | Discharge status: Home / Self Care | Payer: 59 | Source: Ambulatory Visit | Attending: Family Medicine | Admitting: Family Medicine

## 2015-03-26 DIAGNOSIS — M545 Low back pain: Secondary | ICD-10-CM

## 2015-03-27 ENCOUNTER — Inpatient Hospital Stay: Admission: RE | Admit: 2015-03-27 | Payer: Medicare Other | Source: Ambulatory Visit

## 2015-03-31 ENCOUNTER — Other Ambulatory Visit: Payer: Self-pay | Admitting: Emergency Medicine

## 2015-03-31 ENCOUNTER — Other Ambulatory Visit (INDEPENDENT_AMBULATORY_CARE_PROVIDER_SITE_OTHER): Payer: 59

## 2015-03-31 ENCOUNTER — Other Ambulatory Visit: Payer: Self-pay | Admitting: *Deleted

## 2015-03-31 DIAGNOSIS — E119 Type 2 diabetes mellitus without complications: Secondary | ICD-10-CM

## 2015-03-31 LAB — POCT GLYCOSYLATED HEMOGLOBIN (HGB A1C): HEMOGLOBIN A1C: 6.6

## 2015-03-31 LAB — GLUCOSE, POCT (MANUAL RESULT ENTRY): POC Glucose: 186 mg/dl — AB (ref 70–99)

## 2015-03-31 MED ORDER — METFORMIN HCL ER 500 MG PO TB24: ORAL_TABLET | ORAL | Status: DC

## 2015-03-31 NOTE — Progress Notes (Signed)
Pt is here for lab work only. 

## 2015-04-01 ENCOUNTER — Other Ambulatory Visit: Payer: Self-pay | Admitting: Emergency Medicine

## 2015-04-07 ENCOUNTER — Other Ambulatory Visit (INDEPENDENT_AMBULATORY_CARE_PROVIDER_SITE_OTHER): Payer: 59

## 2015-04-07 DIAGNOSIS — R739 Hyperglycemia, unspecified: Secondary | ICD-10-CM | POA: Diagnosis not present

## 2015-04-07 LAB — GLUCOSE, POCT (MANUAL RESULT ENTRY): POC GLUCOSE: 123 mg/dL — AB (ref 70–99)

## 2015-04-10 ENCOUNTER — Ambulatory Visit (INDEPENDENT_AMBULATORY_CARE_PROVIDER_SITE_OTHER): Payer: 59 | Admitting: Family Medicine

## 2015-04-10 ENCOUNTER — Ambulatory Visit (INDEPENDENT_AMBULATORY_CARE_PROVIDER_SITE_OTHER): Payer: 59

## 2015-04-10 VITALS — BP 132/84 | HR 73 | Temp 98.3°F

## 2015-04-10 DIAGNOSIS — M25461 Effusion, right knee: Secondary | ICD-10-CM

## 2015-04-10 DIAGNOSIS — S82001A Unspecified fracture of right patella, initial encounter for closed fracture: Secondary | ICD-10-CM | POA: Diagnosis not present

## 2015-04-10 DIAGNOSIS — M25561 Pain in right knee: Secondary | ICD-10-CM

## 2015-04-10 NOTE — Progress Notes (Addendum)
Subjective:   This chart was scribed for Kyle Ray, MD by Erling Conte, Medical Scribe. This patient was seen in Room 12 and the patient's care was started at 9:46 AM.   Patient ID: Kyle [name], male    DOB: Jan 01, 1946, 69 y.o.   MRN: [id number]  Chief Complaint  Patient presents with  . Knee Injury    right knee x's 1 days after a fall    HPI HPI Comments: Kyle [name] is a 69 y.o. male who presents to the [facility] complaining of constant, moderate, gradually worsening, right knee pain onset 1 day ago. Pt played in golf tournament yesterday, then during party last night tripped while going down the stairs, falling forward (about 8 inches per pt) with majority of weight landing on right anterior knee left knee, and left elbow. He notes an abrasion to left elbow from the fall, bandage is placed to the area and there is no active bleeding. He denies any arthralgias in left elbow. Pt states he also hit the left side of his face. He denies any vision changes, HA, nausea or vomiting. He notes he was able to weight bear initially and remain at the party with minimal initial swelling. He has noted increased swelling over night and pain. Pt is using a walker this morning for ambulation. H/o of degenerative changes in right knee, s/p arthroscopy. He denies any recent imaging of his right knee. Treatment included ice with moderate relief. Pt took 1 0.5 oxycodone tablet for the pain with relief. He denies any other issues at this time.    Patient Active Problem List   Diagnosis Date Noted  . 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 Posterior tibial tendonitis 08/07/2013  . Precordial pain 05/26/2013  . Essential hypertension 10/19/2009  . Diabetes type 2, controlled 10/15/2009  . Hyperlipemia 10/15/2009  . CAD, NATIVE VESSEL 10/15/2009   Past Medical History  Diagnosis Date  . Asthma   . Hypertension   . Tubular adenoma 2006  . Coronary artery disease     non  obstructive  . Allergy   . Arthritis    Past Surgical History  Procedure Laterality Date  . Total hip arthroplasty Left   . Heart catherization N/A 2007  . Salivary gland surgery  1999    Dr Thornell Mule  . Left heart catheterization with coronary angiogram N/A 05/26/2013    Procedure: LEFT HEART CATHETERIZATION WITH CORONARY ANGIOGRAM;  Surgeon: Burnell Blanks, MD;  Location: [facility] CATH LAB;  Service: Cardiovascular;  Laterality: N/A;  . Joint replacement     Allergies  Allergen Reactions  . Cefuroxime Axetil Anaphylaxis and Hives  . Celecoxib Anaphylaxis and Hives  . Nsaids Anaphylaxis and Hives    Pt takes aspirin every other day  . Penicillins Hives  . Sulfa Antibiotics Hives  . Clindamycin/Lincomycin Rash  . Doxycycline Rash   Prior to Admission medications   Medication Sig Start Date End Date Taking? Authorizing Provider  ASMANEX 60 METERED DOSES 220 MCG/INH inhaler INHALE 2 PUFFS ONCE DAILY 11/03/14   Darlyne Russian, MD  aspirin EC 81 MG tablet Take 81 mg by mouth every other day.     Historical Provider, MD  atorvastatin (LIPITOR) 40 MG tablet TAKE 1 TABLET BY MOUTH DAILY FOR CHOLESTEROL 04/01/15   Darlyne Russian, MD  Azelastine HCl (ASTEPRO) 0.15 % SOLN Place 2 sprays into the nose 2 (two) times daily. 01/22/15   Darlyne Russian, MD  Azelastine  HCl 0.15 % SOLN USE 2 SPRAYS IN THE NOSE TWICE DAILY 09/12/13   Theda Sers, PA-C  clobetasol ointment (TEMOVATE) 0.05 % APPLY TOPICALLY TWICE A DAY 10/31/14   Darlyne Russian, MD  diltiazem (CARDIZEM CD) 240 MG 24 hr capsule Take 1 capsule (240 mg total) by mouth daily. 01/22/15   Darlyne Russian, MD  Elastic Bandages & Supports (BACK SUPPORT L/XL) MISC 1 Units by Does not apply route daily as needed. Patient not taking: Reported on 03/25/2015 03/24/15   Jaynee Eagles, PA-C  EPINEPHrine 0.3 mg/0.3 mL IJ SOAJ injection Inject 0.3 mLs (0.3 mg total) into the muscle as needed (for allergic reactions). 09/14/14   Chelle Jeffery, PA-C  fluticasone  (FLONASE) 50 MCG/ACT nasal spray INSTILL 2 SPRAYS INTO NOSTRIL ONCE DAILY 11/02/14   Darlyne Russian, MD  fluticasone (FLOVENT HFA) 220 MCG/ACT inhaler Inhale 1 puff into the lungs 2 (two) times daily. Rinse mouth with water after use. 01/20/15   Darlyne Russian, MD  metFORMIN (GLUCOPHAGE XR) 500 MG 24 hr tablet Take 1 tablet daily 03/31/15   Darlyne Russian, MD  methocarbamol (ROBAXIN) 750 MG tablet Take 1 tablet (750 mg total) by mouth every 8 (eight) hours as needed for muscle spasms. 03/25/15   [name], MD  metoprolol succinate (TOPROL-XL) 50 MG 24 hr tablet Take 1 tablet (50 mg total) by mouth daily. 01/22/15   Darlyne Russian, MD  omeprazole (PRILOSEC) 20 MG capsule Take 1 capsule (20 mg total) by mouth daily. 10/09/14   Darlyne Russian, MD  oxyCODONE-acetaminophen (ROXICET) 5-325 MG per tablet Take 1 tablet by mouth every 8 (eight) hours as needed for severe pain. 03/25/15   [name], MD  predniSONE (DELTASONE) 20 MG tablet Two daily with food 03/25/15   [name], MD  sildenafil (REVATIO) 20 MG tablet Take 2 to 3 one hour prior to intercourse 01/22/15   Darlyne Russian, MD  VENTOLIN HFA 108 (90 BASE) MCG/ACT inhaler Inhale 2 puffs every 4 -6 hours as needed. 01/19/15   Darlyne Russian, MD  VIAGRA 100 MG tablet 1 po daily for erectile dysfunction. 07/20/14   Darlyne Russian, MD  zolpidem (AMBIEN) 10 MG tablet TAKE 1 TABLET BY MOUTH AT BEDTIME 10/08/14   Darlyne Russian, MD   History   Social History  . Marital Status: Married    Spouse Name: N/A  . Number of Children: N/A  . Years of Education: N/A   Occupational History  . Not on file.   Social History Main Topics  . Smoking status: Never Smoker   . Smokeless tobacco: Not on file  . Alcohol Use: 0.0 oz/week     Comment: 2 drinks per day  . Drug Use: No  . Sexual Activity: Not on file   Other Topics Concern  . Not on file   Social History Narrative   Review of Systems  Eyes: Negative for visual disturbance.  Gastrointestinal:  Negative for nausea and vomiting.  Musculoskeletal: Positive for joint swelling and arthralgias (R knee).  Neurological: Negative for headaches.       Objective:   Physical Exam  Constitutional: He is oriented to person, place, and time. He appears well-developed and well-nourished. No distress.  HENT:  Head: Normocephalic and atraumatic.  Eyes: Conjunctivae and EOM are normal.  Neck: Neck supple. No tracheal deviation present.  Cardiovascular: Normal rate.   Pulmonary/Chest: Effort normal. No respiratory distress.  Musculoskeletal:  Pain free bilateral ankles,  bilateral hips Left Knee: Small bandage to left knee, no active bleeding, no appreciable effusion, no bony tenderness. Negative varus and valgus, negative drawer, full ROM with no pain Right Knee: appreciable effusion approximately 2-3+. Held until lacking approximate 12 degrees of extension, approximately 70 degrees of flexion, joint line is non tender. Fibular head non tender. minimal discomfort over inferior pull of patella and proximal patellar tendon. No ecchymosis, skin intact Left elbow: bandage intact, full ROM, no bony tenderness  Neurological: He is alert and oriented to person, place, and time.  Skin: Skin is warm and dry.  Psychiatric: He has a normal mood and affect. His behavior is normal.  Nursing note and vitals reviewed.   Filed Vitals:   04/10/15 0946  BP: 132/84  Pulse: 73  Temp: 98.3 F (36.8 C)  TempSrc: Oral  SpO2: 97%   10:28 AM [facility] reading (PRIMARY) by  Dr. Carlota Raspberry:   R knee - diffuse degenerative changes, small cystic structure medial proximal tibia. Nondisplaced vertical patella fracture - lateral 1/3rd.   XR report: FINDINGS: There is a vertical fracture of the lateral aspect of the patella involving the articular surface of the lateral patellar facet. There is no other fracture or dislocation. There is moderate osteoarthritis of the patellofemoral compartment. There is moderate -severe  osteoarthritis of the medial femorotibial compartment with severe joint space narrowing. There is generalized osteopenia. There is a large joint effusion.  IMPRESSION: Acute nondisplaced vertical fracture of the lateral aspect of the patella involving the articular surface of the lateral patellar facet. Large joint effusion.  Procedure: Risks, benefits, and alternatives discussed for R knee aspiration, superolateral approach.  Verbal consent obtained after any questions were answered. Landmarks noted, and marked as needed. Area cleansed with Betadine x3, ethyl chloride spray for topical anesthesia, followed by alcohol swab. Aspirated with 20g 1 and 1/2 inch needle, 50cc sanguinous effusion withdrawn. No complications, swelling improved. Ace Bandage over bandaid applied.  RTC precautions discussed in regards to injection.     Assessment & Plan:  KORD MONETTE is a 69 y.o. male Right knee pain - Plan: DG Knee Complete 4 Views Right  Knee swelling, right - Plan: DG Knee Complete 4 Views Right  Patellar fracture, right, closed, initial encounter  Nondisplaced vertical patellar fracture. Hemarthrosis aspirated for comfort as above. Ace bandage, knee immobilizer applied, has walker to use - advised NWB (can provide crutches if needed). Has pain medication at home if needed. Will refer to ortho - he would like to call back/advise me of specific provider.  Note provided for work, but advised needs eval by ortho in next 1 week.   No orders of the defined types were placed in this encounter.   Patient Instructions  Knee immobilizer, ace bandage.  Pain med at home if needed. Try to keep weight off affected leg.  Let me know which ortho you would like to see and I can place a referral next week. Also let me know about Monday's shift if I need to provider letter or Dr. Tamala Julian regarding your shift. Ortho eval in next week needed.      I personally performed the services described in this  documentation, which was scribed in my presence. The recorded information has been reviewed and considered, and addended by me as needed.

## 2015-04-10 NOTE — Patient Instructions (Signed)
Knee immobilizer, ace bandage.  Pain med at home if needed. Try to keep weight off affected leg.  Let me know which ortho you would like to see and I can place a referral next week. Also let me know about Monday's shift if I need to provider letter or Dr. Tamala Julian regarding your shift. Ortho eval in next week needed.

## 2015-04-13 ENCOUNTER — Other Ambulatory Visit: Payer: Self-pay | Admitting: Orthopedic Surgery

## 2015-04-14 ENCOUNTER — Ambulatory Visit (INDEPENDENT_AMBULATORY_CARE_PROVIDER_SITE_OTHER): Payer: 59

## 2015-04-14 ENCOUNTER — Other Ambulatory Visit: Payer: Self-pay | Admitting: Emergency Medicine

## 2015-04-14 ENCOUNTER — Ambulatory Visit (INDEPENDENT_AMBULATORY_CARE_PROVIDER_SITE_OTHER): Payer: 59 | Admitting: Emergency Medicine

## 2015-04-14 DIAGNOSIS — I2510 Atherosclerotic heart disease of native coronary artery without angina pectoris: Secondary | ICD-10-CM | POA: Diagnosis not present

## 2015-04-14 DIAGNOSIS — Z419 Encounter for procedure for purposes other than remedying health state, unspecified: Secondary | ICD-10-CM

## 2015-04-14 DIAGNOSIS — S82001D Unspecified fracture of right patella, subsequent encounter for closed fracture with routine healing: Secondary | ICD-10-CM | POA: Diagnosis not present

## 2015-04-14 DIAGNOSIS — R079 Chest pain, unspecified: Secondary | ICD-10-CM

## 2015-04-14 DIAGNOSIS — S82001A Unspecified fracture of right patella, initial encounter for closed fracture: Secondary | ICD-10-CM

## 2015-04-14 NOTE — Progress Notes (Addendum)
   Subjective:    Patient ID: Kyle [name], male    DOB: [birth year], 69 y.o.   MRN: [id number]  HPI patient presents for evaluation of his patellar fracture. He is going to need clearance for surgery. He had a catheterization 2 years ago by Dr. Burt Knack which did show nonobstructive coronary disease.    Review of Systems     Objective:   Physical Exam He is walking with a limp a large dressing on his right knee.  EKG normal sinus rhythm without acute change [facility] reading (PRIMARY) by  Dr.Daub no acute disease there appears to be mild atelectasis in the left base Radiology reading was of emphysematous changes    Assessment & Plan:  We'll go ahead and get Dr. Antionette Char opinion before he has surgery. EKG and chest x-ray were done today he will have a pulmonary function tests done.

## 2015-04-15 ENCOUNTER — Other Ambulatory Visit: Payer: Self-pay | Admitting: Emergency Medicine

## 2015-04-15 ENCOUNTER — Encounter (HOSPITAL_COMMUNITY): Payer: Self-pay

## 2015-04-16 ENCOUNTER — Encounter (HOSPITAL_COMMUNITY): Payer: Self-pay

## 2015-04-16 ENCOUNTER — Encounter (HOSPITAL_COMMUNITY)
Admission: RE | Admit: 2015-04-16 | Discharge: 2015-04-16 | Disposition: A | Discharge status: Home / Self Care | Payer: 59 | Source: Ambulatory Visit | Attending: Orthopedic Surgery | Admitting: Orthopedic Surgery

## 2015-04-16 DIAGNOSIS — Z79899 Other long term (current) drug therapy: Secondary | ICD-10-CM | POA: Insufficient documentation

## 2015-04-16 DIAGNOSIS — Z01812 Encounter for preprocedural laboratory examination: Secondary | ICD-10-CM | POA: Diagnosis not present

## 2015-04-16 DIAGNOSIS — K219 Gastro-esophageal reflux disease without esophagitis: Secondary | ICD-10-CM | POA: Insufficient documentation

## 2015-04-16 DIAGNOSIS — Z7982 Long term (current) use of aspirin: Secondary | ICD-10-CM | POA: Insufficient documentation

## 2015-04-16 DIAGNOSIS — I10 Essential (primary) hypertension: Secondary | ICD-10-CM | POA: Diagnosis not present

## 2015-04-16 DIAGNOSIS — J45909 Unspecified asthma, uncomplicated: Secondary | ICD-10-CM | POA: Diagnosis not present

## 2015-04-16 DIAGNOSIS — E119 Type 2 diabetes mellitus without complications: Secondary | ICD-10-CM | POA: Diagnosis not present

## 2015-04-16 DIAGNOSIS — Z01818 Encounter for other preprocedural examination: Secondary | ICD-10-CM | POA: Insufficient documentation

## 2015-04-16 DIAGNOSIS — I2510 Atherosclerotic heart disease of native coronary artery without angina pectoris: Secondary | ICD-10-CM | POA: Insufficient documentation

## 2015-04-16 DIAGNOSIS — Z0183 Encounter for blood typing: Secondary | ICD-10-CM | POA: Diagnosis not present

## 2015-04-16 HISTORY — DX: Other complications of anesthesia, initial encounter: T88.59XA

## 2015-04-16 HISTORY — DX: Gastro-esophageal reflux disease without esophagitis: K21.9

## 2015-04-16 HISTORY — DX: Failed or difficult intubation, initial encounter: T88.4XXA

## 2015-04-16 HISTORY — DX: Insomnia, unspecified: G47.00

## 2015-04-16 HISTORY — DX: Adverse effect of unspecified anesthetic, initial encounter: T41.45XA

## 2015-04-16 HISTORY — DX: Type 2 diabetes mellitus without complications: E11.9

## 2015-04-16 HISTORY — DX: Other symptoms and signs involving the musculoskeletal system: R29.898

## 2015-04-16 LAB — CBC WITH DIFFERENTIAL/PLATELET
BASOS ABS: 0 10*3/uL (ref 0.0–0.1)
Basophils Relative: 0 % (ref 0–1)
Eosinophils Absolute: 0.1 10*3/uL (ref 0.0–0.7)
Eosinophils Relative: 2 % (ref 0–5)
HEMATOCRIT: 42 % (ref 39.0–52.0)
HEMOGLOBIN: 14.5 g/dL (ref 13.0–17.0)
LYMPHS PCT: 24 % (ref 12–46)
Lymphs Abs: 1.6 10*3/uL (ref 0.7–4.0)
MCH: 30 pg (ref 26.0–34.0)
MCHC: 34.5 g/dL (ref 30.0–36.0)
MCV: 86.8 fL (ref 78.0–100.0)
Monocytes Absolute: 0.8 10*3/uL (ref 0.1–1.0)
Monocytes Relative: 11 % (ref 3–12)
NEUTROS PCT: 63 % (ref 43–77)
Neutro Abs: 4.2 10*3/uL (ref 1.7–7.7)
PLATELETS: 246 10*3/uL (ref 150–400)
RBC: 4.84 MIL/uL (ref 4.22–5.81)
RDW: 13.4 % (ref 11.5–15.5)
WBC: 6.7 10*3/uL (ref 4.0–10.5)

## 2015-04-16 LAB — SURGICAL PCR SCREEN
MRSA, PCR: NEGATIVE
Staphylococcus aureus: NEGATIVE

## 2015-04-16 LAB — URINALYSIS, ROUTINE W REFLEX MICROSCOPIC
Bilirubin Urine: NEGATIVE
Glucose, UA: NEGATIVE mg/dL
Hgb urine dipstick: NEGATIVE
Ketones, ur: NEGATIVE mg/dL
LEUKOCYTES UA: NEGATIVE
Nitrite: NEGATIVE
PH: 7 (ref 5.0–8.0)
Protein, ur: NEGATIVE mg/dL
Specific Gravity, Urine: 1.015 (ref 1.005–1.030)
Urobilinogen, UA: 0.2 mg/dL (ref 0.0–1.0)

## 2015-04-16 LAB — BASIC METABOLIC PANEL
Anion gap: 5 (ref 5–15)
BUN: 12 mg/dL (ref 6–20)
CHLORIDE: 99 mmol/L — AB (ref 101–111)
CO2: 30 mmol/L (ref 22–32)
Calcium: 9.1 mg/dL (ref 8.9–10.3)
Creatinine, Ser: 0.83 mg/dL (ref 0.61–1.24)
GFR calc Af Amer: >60 mL/min (ref >60)
GFR calc non Af Amer: >60 mL/min (ref >60)
Glucose, Bld: 135 mg/dL — ABNORMAL HIGH (ref 65–99)
POTASSIUM: 4.7 mmol/L (ref 3.5–5.1)
Sodium: 134 mmol/L — ABNORMAL LOW (ref 135–145)

## 2015-04-16 LAB — TYPE AND SCREEN
ABO/RH(D): A POS
Antibody Screen: NEGATIVE

## 2015-04-16 LAB — PROTIME-INR
INR: 0.93 (ref 0.00–1.49)
Prothrombin Time: 12.7 seconds (ref 11.6–15.2)

## 2015-04-16 LAB — GLUCOSE, CAPILLARY: Glucose-Capillary: 130 mg/dL — ABNORMAL HIGH (ref 65–99)

## 2015-04-16 LAB — ABO/RH: ABO/RH(D): A POS

## 2015-04-16 LAB — APTT: aPTT: 27 seconds (ref 24–37)

## 2015-04-16 MED ORDER — CHLORHEXIDINE GLUCONATE 4 % EX LIQD: 60.0000 mL | Freq: Once | CUTANEOUS | Status: DC

## 2015-04-16 NOTE — Pre-Procedure Instructions (Signed)
    Cornell Bourbon Dubberly  04/16/2015      [facility] - [place], [place] - [address] [place] [address] Phone: [phone] Fax: [place], [place] [address] Phone: [phone] Fax: [phone]    Your procedure is scheduled on Monday, June 27th, 2016 at 9:55 AM.  Report to [facility] Admitting at 7:55 A.M.  Call this number if you have problems the morning of surgery:  [phone]   Remember:  Do not eat food or drink liquids after midnight.   Take these medicines the morning of surgery with A SIP OF WATER Azelastine HCL (Astepro), Cetirizine (Zyrtec), Diltiazem (Cardizem), Fluticasone (Flonase), ALL inhalers, Hydrocodone-Acetaminophen (Norco), Methocarbamol (Robaxin), Metoprolol Succinate (Toprol-XL), Omeprazole (Prilosec), Prednisone (Deltasone).  Stop taking: Aspirin, Ibuprofen, BC's, Goody's, Aleve, Naproxen, Fish Oil, and herbal medications.    Do not wear jewelry.  Do not wear lotions, powders, or colognes.  You may wear deodorant.   Men may shave face and neck.  Do not bring valuables to the hospital.  [facility] is not responsible for any belongings or valuables.  Contacts, dentures or bridgework may not be worn into surgery.  Leave your suitcase in the car.  After surgery it may be brought to your room.  For patients admitted to the hospital, discharge time will be determined by your treatment team.  Patients discharged the day of surgery will not be allowed to drive home.    Special instructions:  See attached.   Please read over the following fact sheets that you were given. Pain Booklet, Coughing and Deep Breathing, Blood Transfusion Information, MRSA Information and Surgical Site Infection Prevention

## 2015-04-16 NOTE — Progress Notes (Signed)
PCP- Dr. Everlene Farrier  Cardiologist- Dr. Burt Knack -      -Per note from Dr. Everlene Farrier- Pt. Needs cardiac clearance with Dr. Burt Knack.  Patient states that Dr. Everlene Farrier was going to follow-up with Dr. Burt Knack  Cardiac Cath - 2014  CXR - 04/14/15 - Epic  EKG - 04/14/15 - Epic  Echo - denies  Stress Test - denies

## 2015-04-16 NOTE — Progress Notes (Signed)
Anesthesia Chart Review:  Pt is 69 year old male scheduled for R total knee arthroplasty on 04/26/2015 with Dr. Mayer Camel.   PCP is Dr. Everlene Farrier.   PMH includes: CAD, HTN, DM, asthma, anemia (after total hip surgery), GERD. Never smoker. BMI 26.  Pt does not have true documented history of difficult intubation, but has limited ROM of neck, so he has the potential to be a difficult intubation.   Medications include: ASA, lipitor, diltiazem, metformin, metoprolol, sildenafil.   Preoperative labs reviewed.    Chest x-ray 04/14/2015 reviewed.  1. No active cardiopulmonary disease. 2. 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 3. Question ankylosing spondylitis.  EKG 04/14/2015: sinus rhythm.   Carotid duplex US 02/12/2014: 1. Very mild heterogeneous and partially calcified atherosclerotic plaque bilaterally without evidence of carotid artery stenosis. 2. Vertebral arteries are patent with normal antegrade flow.  Cardiac cath 05/26/2013: 1. 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 Normal LV function 3. Normal aortic root  Dr. Everlene Farrier indicated in his note dated 04/14/2015 that pt will need cardiac clearance prior to surgery. Notified Myriam Jacobson in Dr. Damita Dunnings office.   Willeen Cass, FNP-BC [facility]/Anesthesiology Phone: [phone] 04/16/2015 1:25 PM

## 2015-04-17 LAB — HEMOGLOBIN A1C
Hgb A1c MFr Bld: 6.9 % — ABNORMAL HIGH (ref 4.8–5.6)
Mean Plasma Glucose: 151 mg/dL

## 2015-04-17 NOTE — Telephone Encounter (Signed)
See note from pharmacy on Rx. Please clarify dose.

## 2015-04-19 ENCOUNTER — Encounter: Payer: Self-pay | Admitting: Cardiovascular Disease

## 2015-04-19 ENCOUNTER — Ambulatory Visit (INDEPENDENT_AMBULATORY_CARE_PROVIDER_SITE_OTHER): Payer: 59 | Admitting: Emergency Medicine

## 2015-04-19 ENCOUNTER — Other Ambulatory Visit: Payer: Self-pay | Admitting: Physician Assistant

## 2015-04-19 DIAGNOSIS — S82001A Unspecified fracture of right patella, initial encounter for closed fracture: Secondary | ICD-10-CM

## 2015-04-19 DIAGNOSIS — R9389 Abnormal findings on diagnostic imaging of other specified body structures: Secondary | ICD-10-CM

## 2015-04-19 LAB — PULMONARY FUNCTION TEST

## 2015-04-19 NOTE — Progress Notes (Signed)
I spoke with Dr Everlene Farrier this morning. 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  I have reviewed his records. He has mild nonobstructive coronary artery disease seen at cardiac catheterization most recently in 2014. The patient was contacted and he has having no cardiac symptoms. He is at low risk of perioperative cardiac events and he can proceed with surgery without further testing at this time.

## 2015-04-19 NOTE — Progress Notes (Signed)
I will fax this note to the surgeon's (Dr Frederik Pear) office.

## 2015-04-20 ENCOUNTER — Telehealth: Payer: Self-pay | Admitting: Emergency Medicine

## 2015-04-20 ENCOUNTER — Ambulatory Visit (INDEPENDENT_AMBULATORY_CARE_PROVIDER_SITE_OTHER): Payer: 59 | Admitting: Family Medicine

## 2015-04-20 ENCOUNTER — Other Ambulatory Visit: Payer: Self-pay | Admitting: Family Medicine

## 2015-04-20 ENCOUNTER — Ambulatory Visit (INDEPENDENT_AMBULATORY_CARE_PROVIDER_SITE_OTHER): Payer: 59

## 2015-04-20 DIAGNOSIS — S82001D Unspecified fracture of right patella, subsequent encounter for closed fracture with routine healing: Secondary | ICD-10-CM

## 2015-04-20 NOTE — Telephone Encounter (Signed)
Please call Dr. Elder Cyphers and ask if he was released by the cardiologist for his surgery without having to be seen

## 2015-04-20 NOTE — Telephone Encounter (Signed)
New Message   Per pt wife pt no longer needs appt he will be taken off of our workqueue     Thank you  [facility]

## 2015-04-20 NOTE — Progress Notes (Signed)
History of Right patellar fracture recently - see last ov.  Followed by ortho now, but question of possible bipartite patella.  L knee xray obtained for comparison.   [facility] reading (PRIMARY) by  Dr. Carlota Raspberry: L knee: no fracture, patella intact.

## 2015-04-20 NOTE — Progress Notes (Unsigned)
followed by ortho for recent R patellar fracture.  Question of whether has bipartite patella. Recommended contralateral knee XR. Will check 2 view knee on left for comparison.    [facility] reading (PRIMARY) by  Dr.Tomoki Lucken: L knee:

## 2015-04-21 NOTE — Progress Notes (Signed)
   Subjective:    Patient ID: Kyle [name], male    DOB: [birth year], 69 y.o.   MRN: [id number]  HPI patient came requesting a chest x-ray and EKG prior to having knee replacement surgery next week.    Review of Systems He has significant pain in his fractured knee. He denies any cardiovascular complaint    Objective:   Physical Exam  EKG no change from previous CXR no acute changes seen     Assessment & Plan:  We'll check with Dr. Antionette Char office. Patient had a catheterization 2 years ago. Will check with Dr. Burt Knack to be sure patient is cleared from a cardiovascular standpoint to have spinal anesthesia for knee replacement

## 2015-04-23 NOTE — H&P (Signed)
TOTAL KNEE ADMISSION H&P  Patient is being admitted for right total knee arthroplasty.  Subjective:  Chief Complaint:right knee pain.  HPI: Kyle [name], 69 y.o. male, has a history of pain and functional disability in the right knee due to arthritis and has failed non-surgical conservative treatments for greater than 12 weeks to includeNSAID's and/or analgesics, corticosteriod injections, flexibility and strengthening excercises, use of assistive devices and activity modification.  Onset of symptoms was gradual, starting 4 years ago with gradually worsening course since that time. The patient noted no past surgery on the right knee(s).  Patient currently rates pain in the right knee(s) at 10 out of 10 with activity. Patient has night pain, worsening of pain with activity and weight bearing, pain that interferes with activities of daily living, pain with passive range of motion, crepitus and joint swelling.  Patient has evidence of joint subluxation and joint space narrowing by imaging studies.  There is no active infection.  Patient Active Problem List   Diagnosis Date Noted  . 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 Posterior tibial tendonitis 08/07/2013  . Precordial pain 05/26/2013  . Essential hypertension 10/19/2009  . Diabetes type 2, controlled 10/15/2009  . Hyperlipemia 10/15/2009  . CAD, NATIVE VESSEL 10/15/2009   Past Medical History  Diagnosis Date  . Hypertension   . Tubular adenoma 2006  . Allergy   . Arthritis   . Complication of anesthesia     pt. states that neck is arthritic and can not move neck very well.   . Difficult intubation   . Asthma     pt. states very mild  . Diabetes mellitus without complication     type 2  . Coronary artery disease     non obstructive  . History of acute bronchitis   . GERD (gastroesophageal reflux disease)     omeprazole PRN  . Weakness of right arm   . Cancer     skin cancer  . Anemia     pt. states after total hip  . Insomnia      ambien    Past Surgical History  Procedure Laterality Date  . Total hip arthroplasty Left 2004  . Heart catherization N/A 2007  . Salivary gland surgery  1999    Dr Thornell Mule  . Left heart catheterization with coronary angiogram N/A 05/26/2013    Procedure: LEFT HEART CATHETERIZATION WITH CORONARY ANGIOGRAM;  Surgeon: Burnell Blanks, MD;  Location: [facility] CATH LAB;  Service: Cardiovascular;  Laterality: N/A;  . Joint replacement    . Cardiac catheterization    . Colonoscopy      pt. states that may have had a polyp    No prescriptions prior to admission   Allergies  Allergen Reactions  . Ceftin [Cefuroxime Axetil] Anaphylaxis  . Cefuroxime Axetil Anaphylaxis and Hives  . Celecoxib Anaphylaxis and Hives  . Nsaids Anaphylaxis and Hives    Pt takes aspirin every other day  . Penicillins Hives  . Sulfa Antibiotics Hives  . Adhesive [Tape] Rash  . Clindamycin/Lincomycin Rash  . Doxycycline Rash    History  Substance Use Topics  . Smoking status: Never Smoker   . Smokeless tobacco: Not on file  . Alcohol Use: 0.0 oz/week     Comment: pt. states 3-4 drinks a week    Family History  Problem Relation Age of Onset  . Coronary artery disease    . Heart disease Mother   . Heart disease Father   . Coronary artery disease  Sister      Review of Systems  Constitutional: Negative.   HENT: Negative.   Eyes: Negative.   Respiratory: Negative.   Cardiovascular: Negative.   Gastrointestinal: Negative.   Genitourinary:       Weak stream  Musculoskeletal: Positive for joint pain.  Skin: Negative.   Neurological: Negative.   Endo/Heme/Allergies: Negative.   Psychiatric/Behavioral: Negative.     Objective:  Physical Exam  Constitutional: He is oriented to person, place, and time. He appears well-developed and well-nourished.  HENT:  Head: Normocephalic and atraumatic.  Eyes: Pupils are equal, round, and reactive to light.  Neck: Normal range of motion. Neck supple.   Cardiovascular: Intact distal pulses.   Respiratory: Effort normal.  Musculoskeletal: He exhibits tenderness.  The right knee has a 3+ hemarthrosis, range of motion is from 10-60 collateral ligaments are stable he is tender over the patella.    Neurological: He is alert and oriented to person, place, and time.  Skin: Skin is warm and dry.  Psychiatric: He has a normal mood and affect. His behavior is normal. Judgment and thought content normal.    Vital signs in last 24 hours:    Labs:   Estimated body mass index is 27.13 kg/(m^2) as calculated from the following:   Height as of 03/25/15: 6' 3.5" (1.918 m).   Weight as of 03/26/15: 99.791 kg (220 lb).   Imaging Review X-rays on the PACS system are reviewed and do show a vertical fracture of the right patella nondisplaced involving the lateral one quarter of the patella the medial three quarters have a normal appearance.  He also has bone-on-bone arthritis to the medial compartment with lateral subluxation of the tibia beneath the femur of the least 5 mm.  Assessment/Plan:  End-stage arthritis the right knee bone-on-bone with lateral subluxation of tibia beneath the femur having failed all conservative measures.  The patient history, physical examination, clinical judgment of the provider and imaging studies are consistent with end stage degenerative joint disease of the right knee(s) and total knee arthroplasty is deemed medically necessary. The treatment options including medical management, injection therapy arthroscopy and arthroplasty were discussed at length. The risks and benefits of total knee arthroplasty were presented and reviewed. The risks due to aseptic loosening, infection, stiffness, patella tracking problems, thromboembolic complications and other imponderables were discussed. The patient acknowledged the explanation, agreed to proceed with the plan and consent was signed. Patient is being admitted for inpatient treatment  for surgery, pain control, PT, OT, prophylactic antibiotics, VTE prophylaxis, progressive ambulation and ADL's and discharge planning. The patient is planning to be discharged home with home health services

## 2015-04-25 DIAGNOSIS — M1711 Unilateral primary osteoarthritis, right knee: Secondary | ICD-10-CM | POA: Diagnosis present

## 2015-04-25 DIAGNOSIS — S82001A Unspecified fracture of right patella, initial encounter for closed fracture: Secondary | ICD-10-CM | POA: Diagnosis present

## 2015-04-26 ENCOUNTER — Inpatient Hospital Stay (HOSPITAL_COMMUNITY)
Admission: RE | Admit: 2015-04-26 | Discharge: 2015-04-28 | DRG: 470 | Disposition: A | Discharge status: Home / Self Care | Payer: 59 | Source: Ambulatory Visit | Attending: Orthopedic Surgery | Admitting: Orthopedic Surgery

## 2015-04-26 ENCOUNTER — Other Ambulatory Visit (HOSPITAL_COMMUNITY): Payer: Medicare Other

## 2015-04-26 ENCOUNTER — Inpatient Hospital Stay (HOSPITAL_COMMUNITY): Payer: 59 | Admitting: Vascular Surgery

## 2015-04-26 ENCOUNTER — Inpatient Hospital Stay (HOSPITAL_COMMUNITY): Payer: 59 | Admitting: Certified Registered Nurse Anesthetist

## 2015-04-26 ENCOUNTER — Encounter (HOSPITAL_COMMUNITY): Payer: Self-pay | Admitting: *Deleted

## 2015-04-26 ENCOUNTER — Encounter (HOSPITAL_COMMUNITY)
Admission: RE | Disposition: A | Discharge status: Home / Self Care | Payer: Self-pay | Source: Ambulatory Visit | Attending: Orthopedic Surgery

## 2015-04-26 DIAGNOSIS — Z881 Allergy status to other antibiotic agents status: Secondary | ICD-10-CM

## 2015-04-26 DIAGNOSIS — E785 Hyperlipidemia, unspecified: Secondary | ICD-10-CM | POA: Diagnosis present

## 2015-04-26 DIAGNOSIS — Z8249 Family history of ischemic heart disease and other diseases of the circulatory system: Secondary | ICD-10-CM | POA: Diagnosis not present

## 2015-04-26 DIAGNOSIS — Z886 Allergy status to analgesic agent status: Secondary | ICD-10-CM | POA: Diagnosis not present

## 2015-04-26 DIAGNOSIS — M1711 Unilateral primary osteoarthritis, right knee: Principal | ICD-10-CM | POA: Diagnosis present

## 2015-04-26 DIAGNOSIS — S82001A Unspecified fracture of right patella, initial encounter for closed fracture: Secondary | ICD-10-CM | POA: Diagnosis present

## 2015-04-26 DIAGNOSIS — Z91048 Other nonmedicinal substance allergy status: Secondary | ICD-10-CM | POA: Diagnosis not present

## 2015-04-26 DIAGNOSIS — Z888 Allergy status to other drugs, medicaments and biological substances status: Secondary | ICD-10-CM

## 2015-04-26 DIAGNOSIS — D62 Acute posthemorrhagic anemia: Secondary | ICD-10-CM | POA: Diagnosis not present

## 2015-04-26 DIAGNOSIS — Z882 Allergy status to sulfonamides status: Secondary | ICD-10-CM

## 2015-04-26 DIAGNOSIS — M1710 Unilateral primary osteoarthritis, unspecified knee: Secondary | ICD-10-CM | POA: Diagnosis present

## 2015-04-26 DIAGNOSIS — Z880 Allergy status to penicillin: Secondary | ICD-10-CM | POA: Diagnosis not present

## 2015-04-26 DIAGNOSIS — M25561 Pain in right knee: Secondary | ICD-10-CM | POA: Diagnosis present

## 2015-04-26 DIAGNOSIS — I10 Essential (primary) hypertension: Secondary | ICD-10-CM | POA: Diagnosis present

## 2015-04-26 DIAGNOSIS — I2510 Atherosclerotic heart disease of native coronary artery without angina pectoris: Secondary | ICD-10-CM | POA: Diagnosis present

## 2015-04-26 DIAGNOSIS — E119 Type 2 diabetes mellitus without complications: Secondary | ICD-10-CM | POA: Diagnosis present

## 2015-04-26 DIAGNOSIS — M19019 Primary osteoarthritis, unspecified shoulder: Secondary | ICD-10-CM | POA: Diagnosis present

## 2015-04-26 DIAGNOSIS — Z96642 Presence of left artificial hip joint: Secondary | ICD-10-CM | POA: Diagnosis present

## 2015-04-26 HISTORY — DX: Basal cell carcinoma of skin, unspecified: C44.91

## 2015-04-26 HISTORY — PX: TOTAL KNEE ARTHROPLASTY: SHX125

## 2015-04-26 HISTORY — DX: Unspecified osteoarthritis, unspecified site: M19.90

## 2015-04-26 HISTORY — DX: Pure hypercholesterolemia, unspecified: E78.00

## 2015-04-26 HISTORY — DX: Prediabetes: R73.03

## 2015-04-26 HISTORY — DX: Reserved for concepts with insufficient information to code with codable children: IMO0002

## 2015-04-26 LAB — GLUCOSE, CAPILLARY
GLUCOSE-CAPILLARY: 117 mg/dL — AB (ref 65–99)
Glucose-Capillary: 121 mg/dL — ABNORMAL HIGH (ref 65–99)
Glucose-Capillary: 92 mg/dL (ref 65–99)

## 2015-04-26 SURGERY — ARTHROPLASTY, KNEE, TOTAL
Anesthesia: Monitor Anesthesia Care | Site: Knee | Laterality: Right

## 2015-04-26 MED ORDER — ATORVASTATIN CALCIUM 40 MG PO TABS: 40.0000 mg | ORAL_TABLET | Freq: Every day | ORAL | Status: DC

## 2015-04-26 MED ORDER — MIDAZOLAM HCL 2 MG/2ML IJ SOLN
INTRAMUSCULAR | Status: DC | PRN
  Administered 2015-04-26: 2 mg via INTRAVENOUS

## 2015-04-26 MED ORDER — ONDANSETRON HCL 4 MG PO TABS: 4.0000 mg | ORAL_TABLET | Freq: Four times a day (QID) | ORAL | Status: DC | PRN

## 2015-04-26 MED ORDER — METOCLOPRAMIDE HCL 5 MG PO TABS: 5.0000 mg | ORAL_TABLET | Freq: Three times a day (TID) | ORAL | Status: DC | PRN

## 2015-04-26 MED ORDER — MENTHOL 3 MG MT LOZG
1.0000 | LOZENGE | OROMUCOSAL | Status: DC | PRN
  Administered 2015-04-27: 3 mg via ORAL
  Filled 2015-04-26: qty 9

## 2015-04-26 MED ORDER — LORATADINE 10 MG PO TABS
10.0000 mg | ORAL_TABLET | Freq: Every day | ORAL | Status: DC
  Administered 2015-04-27 – 2015-04-28 (×2): 10 mg via ORAL
  Filled 2015-04-26 (×2): qty 1

## 2015-04-26 MED ORDER — ALBUTEROL SULFATE HFA 108 (90 BASE) MCG/ACT IN AERS: 2.0000 | INHALATION_SPRAY | RESPIRATORY_TRACT | Status: DC | PRN

## 2015-04-26 MED ORDER — KCL IN DEXTROSE-NACL 20-5-0.45 MEQ/L-%-% IV SOLN
INTRAVENOUS | Status: DC
  Administered 2015-04-26 – 2015-04-27 (×3): via INTRAVENOUS
  Filled 2015-04-26 (×3): qty 1000

## 2015-04-26 MED ORDER — ONDANSETRON HCL 4 MG/2ML IJ SOLN: 4.0000 mg | Freq: Four times a day (QID) | INTRAMUSCULAR | Status: DC | PRN

## 2015-04-26 MED ORDER — SODIUM CHLORIDE 0.9 % IR SOLN
Status: DC | PRN
  Administered 2015-04-26: 1000 mL

## 2015-04-26 MED ORDER — HYDROMORPHONE HCL 1 MG/ML IJ SOLN
INTRAMUSCULAR | Status: AC
  Filled 2015-04-26: qty 2

## 2015-04-26 MED ORDER — ALPRAZOLAM 0.25 MG PO TABS
0.2500 mg | ORAL_TABLET | Freq: Three times a day (TID) | ORAL | Status: DC | PRN
  Administered 2015-04-26 – 2015-04-28 (×6): 0.25 mg via ORAL
  Filled 2015-04-26 (×5): qty 1

## 2015-04-26 MED ORDER — PHENYLEPHRINE 40 MCG/ML (10ML) SYRINGE FOR IV PUSH (FOR BLOOD PRESSURE SUPPORT)
PREFILLED_SYRINGE | INTRAVENOUS | Status: AC
  Filled 2015-04-26: qty 10

## 2015-04-26 MED ORDER — FLEET ENEMA 7-19 GM/118ML RE ENEM: 1.0000 | ENEMA | Freq: Once | RECTAL | Status: AC | PRN

## 2015-04-26 MED ORDER — VANCOMYCIN HCL 1000 MG IV SOLR
1000.0000 mg | INTRAVENOUS | Status: DC | PRN
  Administered 2015-04-26: 1000 mg via INTRAVENOUS

## 2015-04-26 MED ORDER — ALBUTEROL SULFATE (2.5 MG/3ML) 0.083% IN NEBU: 3.0000 mL | INHALATION_SOLUTION | RESPIRATORY_TRACT | Status: DC | PRN

## 2015-04-26 MED ORDER — FENTANYL CITRATE (PF) 100 MCG/2ML IJ SOLN
INTRAMUSCULAR | Status: DC | PRN
  Administered 2015-04-26: 25 ug via INTRAVENOUS
  Administered 2015-04-26 (×2): 50 ug via INTRAVENOUS

## 2015-04-26 MED ORDER — HYDROMORPHONE HCL 1 MG/ML IJ SOLN
1.0000 mg | INTRAMUSCULAR | Status: DC | PRN
  Administered 2015-04-26 – 2015-04-27 (×8): 1 mg via INTRAVENOUS
  Filled 2015-04-26 (×8): qty 1

## 2015-04-26 MED ORDER — ZOLPIDEM TARTRATE 5 MG PO TABS
5.0000 mg | ORAL_TABLET | Freq: Every day | ORAL | Status: DC
  Administered 2015-04-27 (×2): 5 mg via ORAL
  Filled 2015-04-26 (×3): qty 1

## 2015-04-26 MED ORDER — DEXTROSE-NACL 5-0.45 % IV SOLN: INTRAVENOUS | Status: DC

## 2015-04-26 MED ORDER — PHENYLEPHRINE HCL 10 MG/ML IJ SOLN
INTRAMUSCULAR | Status: DC | PRN
  Administered 2015-04-26 (×2): 40 ug via INTRAVENOUS
  Administered 2015-04-26 (×2): 80 ug via INTRAVENOUS
  Administered 2015-04-26 (×3): 40 ug via INTRAVENOUS

## 2015-04-26 MED ORDER — DILTIAZEM HCL ER COATED BEADS 240 MG PO CP24: 480.0000 mg | ORAL_CAPSULE | Freq: Every day | ORAL | Status: DC

## 2015-04-26 MED ORDER — HYDROMORPHONE HCL 1 MG/ML IJ SOLN
0.2500 mg | INTRAMUSCULAR | Status: DC | PRN
  Administered 2015-04-26: 1 mg via INTRAVENOUS
  Administered 2015-04-26: 2 mg via INTRAVENOUS

## 2015-04-26 MED ORDER — PROPOFOL 10 MG/ML IV BOLUS
INTRAVENOUS | Status: DC | PRN
  Administered 2015-04-26: 40 mg via INTRAVENOUS

## 2015-04-26 MED ORDER — ALUM & MAG HYDROXIDE-SIMETH 200-200-20 MG/5ML PO SUSP
30.0000 mL | ORAL | Status: DC | PRN
  Administered 2015-04-27: 30 mL via ORAL
  Filled 2015-04-26: qty 30

## 2015-04-26 MED ORDER — SILDENAFIL CITRATE 20 MG PO TABS: 80.0000 mg | ORAL_TABLET | ORAL | Status: DC | PRN

## 2015-04-26 MED ORDER — DILTIAZEM HCL ER COATED BEADS 240 MG PO CP24
480.0000 mg | ORAL_CAPSULE | Freq: Every day | ORAL | Status: DC
  Administered 2015-04-27: 480 mg via ORAL
  Filled 2015-04-26 (×2): qty 2

## 2015-04-26 MED ORDER — ZOLPIDEM TARTRATE 5 MG PO TABS: 10.0000 mg | ORAL_TABLET | Freq: Every day | ORAL | Status: DC

## 2015-04-26 MED ORDER — BUDESONIDE 0.5 MG/2ML IN SUSP
0.5000 mg | Freq: Two times a day (BID) | RESPIRATORY_TRACT | Status: DC
  Filled 2015-04-26 (×2): qty 2

## 2015-04-26 MED ORDER — METOPROLOL SUCCINATE ER 50 MG PO TB24: 50.0000 mg | ORAL_TABLET | Freq: Every day | ORAL | Status: DC

## 2015-04-26 MED ORDER — ALPRAZOLAM 0.25 MG PO TABS
ORAL_TABLET | ORAL | Status: AC
  Administered 2015-04-26: 15:00:00
  Filled 2015-04-26: qty 1

## 2015-04-26 MED ORDER — LACTATED RINGERS IV SOLN
INTRAVENOUS | Status: DC
  Administered 2015-04-26 (×3): via INTRAVENOUS

## 2015-04-26 MED ORDER — METFORMIN HCL ER 500 MG PO TB24: 500.0000 mg | ORAL_TABLET | Freq: Every day | ORAL | Status: DC

## 2015-04-26 MED ORDER — MIDAZOLAM HCL 2 MG/2ML IJ SOLN
INTRAMUSCULAR | Status: AC
  Filled 2015-04-26: qty 2

## 2015-04-26 MED ORDER — FLUTICASONE PROPIONATE HFA 220 MCG/ACT IN AERO: 1.0000 | INHALATION_SPRAY | Freq: Every day | RESPIRATORY_TRACT | Status: DC

## 2015-04-26 MED ORDER — AZELASTINE HCL 0.15 % NA SOLN: 2.0000 | Freq: Every day | NASAL | Status: DC | PRN

## 2015-04-26 MED ORDER — METHOCARBAMOL 1000 MG/10ML IJ SOLN
500.0000 mg | INTRAVENOUS | Status: AC
  Administered 2015-04-26: 500 mg via INTRAVENOUS
  Filled 2015-04-26: qty 5

## 2015-04-26 MED ORDER — OXYCODONE HCL 5 MG/5ML PO SOLN: 5.0000 mg | Freq: Once | ORAL | Status: DC | PRN

## 2015-04-26 MED ORDER — OXYCODONE-ACETAMINOPHEN 5-325 MG PO TABS: 1.0000 | ORAL_TABLET | ORAL | Status: DC | PRN

## 2015-04-26 MED ORDER — FLUTICASONE PROPIONATE 50 MCG/ACT NA SUSP
2.0000 | Freq: Every day | NASAL | Status: DC
  Administered 2015-04-27 – 2015-04-28 (×2): 2 via NASAL
  Filled 2015-04-26: qty 16

## 2015-04-26 MED ORDER — HYDROMORPHONE HCL 1 MG/ML IJ SOLN
INTRAMUSCULAR | Status: AC
  Filled 2015-04-26: qty 1

## 2015-04-26 MED ORDER — VENTOLIN HFA 108 (90 BASE) MCG/ACT IN AERS: 1.0000 | INHALATION_SPRAY | Freq: Four times a day (QID) | RESPIRATORY_TRACT | Status: DC | PRN

## 2015-04-26 MED ORDER — OMEPRAZOLE 20 MG PO CPDR: 20.0000 mg | DELAYED_RELEASE_CAPSULE | Freq: Every day | ORAL | Status: DC | PRN

## 2015-04-26 MED ORDER — PANTOPRAZOLE SODIUM 40 MG PO TBEC
40.0000 mg | DELAYED_RELEASE_TABLET | Freq: Every day | ORAL | Status: DC
  Administered 2015-04-27: 40 mg via ORAL
  Filled 2015-04-26: qty 1

## 2015-04-26 MED ORDER — ACETAMINOPHEN 650 MG RE SUPP: 650.0000 mg | Freq: Four times a day (QID) | RECTAL | Status: DC | PRN

## 2015-04-26 MED ORDER — DOCUSATE SODIUM 100 MG PO CAPS
100.0000 mg | ORAL_CAPSULE | Freq: Two times a day (BID) | ORAL | Status: DC
  Administered 2015-04-26 – 2015-04-28 (×4): 100 mg via ORAL
  Filled 2015-04-26 (×4): qty 1

## 2015-04-26 MED ORDER — METHOCARBAMOL 1000 MG/10ML IJ SOLN: 500.0000 mg | Freq: Four times a day (QID) | INTRAVENOUS | Status: DC | PRN

## 2015-04-26 MED ORDER — BUPIVACAINE IN DEXTROSE 0.75-8.25 % IT SOLN
INTRATHECAL | Status: DC | PRN
  Administered 2015-04-26: 1.8 mL via INTRATHECAL

## 2015-04-26 MED ORDER — OXYCODONE HCL 5 MG PO TABS
5.0000 mg | ORAL_TABLET | ORAL | Status: DC | PRN
  Administered 2015-04-26 – 2015-04-27 (×2): 10 mg via ORAL
  Administered 2015-04-27: 5 mg via ORAL
  Administered 2015-04-27 – 2015-04-28 (×4): 10 mg via ORAL
  Filled 2015-04-26 (×8): qty 2

## 2015-04-26 MED ORDER — METFORMIN HCL ER 500 MG PO TB24
500.0000 mg | ORAL_TABLET | Freq: Every day | ORAL | Status: DC
Start: 2015-04-27 — End: 2015-04-28
  Administered 2015-04-27 – 2015-04-28 (×2): 500 mg via ORAL
  Filled 2015-04-26 (×2): qty 1

## 2015-04-26 MED ORDER — SENNOSIDES-DOCUSATE SODIUM 8.6-50 MG PO TABS: 1.0000 | ORAL_TABLET | Freq: Every evening | ORAL | Status: DC | PRN

## 2015-04-26 MED ORDER — METHOCARBAMOL 500 MG PO TABS
500.0000 mg | ORAL_TABLET | Freq: Four times a day (QID) | ORAL | Status: DC | PRN
  Administered 2015-04-26 – 2015-04-28 (×5): 500 mg via ORAL
  Filled 2015-04-26 (×5): qty 1

## 2015-04-26 MED ORDER — CLOBETASOL PROPIONATE 0.05 % EX OINT: TOPICAL_OINTMENT | CUTANEOUS | Status: AC

## 2015-04-26 MED ORDER — ONDANSETRON HCL 4 MG/2ML IJ SOLN
INTRAMUSCULAR | Status: AC
  Filled 2015-04-26: qty 2

## 2015-04-26 MED ORDER — HYDROMORPHONE HCL 1 MG/ML IJ SOLN: 0.2500 mg | INTRAMUSCULAR | Status: DC | PRN

## 2015-04-26 MED ORDER — METHOCARBAMOL 500 MG PO TABS: 500.0000 mg | ORAL_TABLET | Freq: Two times a day (BID) | ORAL | Status: DC

## 2015-04-26 MED ORDER — VANCOMYCIN HCL 10 G IV SOLR
1500.0000 mg | INTRAVENOUS | Status: AC
  Administered 2015-04-26: 1500 mg via INTRAVENOUS
  Filled 2015-04-26: qty 1500

## 2015-04-26 MED ORDER — PROPOFOL INFUSION 10 MG/ML OPTIME
INTRAVENOUS | Status: DC | PRN
Start: 2015-04-26 — End: 2015-04-26
  Administered 2015-04-26: 50 ug/kg/min via INTRAVENOUS

## 2015-04-26 MED ORDER — HYDROMORPHONE HCL 1 MG/ML IJ SOLN
INTRAMUSCULAR | Status: AC
Start: 2015-04-26 — End: 2015-04-27
  Filled 2015-04-26: qty 1

## 2015-04-26 MED ORDER — PHENOL 1.4 % MT LIQD
1.0000 | OROMUCOSAL | Status: DC | PRN
  Filled 2015-04-26: qty 177

## 2015-04-26 MED ORDER — BISACODYL 5 MG PO TBEC: 5.0000 mg | DELAYED_RELEASE_TABLET | Freq: Every day | ORAL | Status: DC | PRN

## 2015-04-26 MED ORDER — ONDANSETRON HCL 4 MG/2ML IJ SOLN
INTRAMUSCULAR | Status: DC | PRN
  Administered 2015-04-26: 4 mg via INTRAVENOUS

## 2015-04-26 MED ORDER — SODIUM CHLORIDE 0.9 % IJ SOLN
INTRAMUSCULAR | Status: DC | PRN
  Administered 2015-04-26: 20 mL

## 2015-04-26 MED ORDER — PROPOFOL 10 MG/ML IV BOLUS
INTRAVENOUS | Status: AC
  Filled 2015-04-26: qty 20

## 2015-04-26 MED ORDER — ONDANSETRON HCL 4 MG/2ML IJ SOLN
4.0000 mg | Freq: Four times a day (QID) | INTRAMUSCULAR | Status: DC | PRN
  Administered 2015-04-27: 4 mg via INTRAVENOUS
  Filled 2015-04-26: qty 2

## 2015-04-26 MED ORDER — TRANEXAMIC ACID 1000 MG/10ML IV SOLN
2000.0000 mg | Freq: Once | INTRAVENOUS | Status: AC
  Administered 2015-04-26: 2000 mg via TOPICAL
  Filled 2015-04-26: qty 20

## 2015-04-26 MED ORDER — BUPIVACAINE LIPOSOME 1.3 % IJ SUSP
20.0000 mL | Freq: Once | INTRAMUSCULAR | Status: AC
  Administered 2015-04-26: 20 mL
  Filled 2015-04-26: qty 20

## 2015-04-26 MED ORDER — METOCLOPRAMIDE HCL 5 MG/ML IJ SOLN: 5.0000 mg | Freq: Three times a day (TID) | INTRAMUSCULAR | Status: DC | PRN

## 2015-04-26 MED ORDER — FENTANYL CITRATE (PF) 100 MCG/2ML IJ SOLN
INTRAMUSCULAR | Status: DC
Start: 2015-04-26 — End: 2015-04-26
  Filled 2015-04-26: qty 2

## 2015-04-26 MED ORDER — FENTANYL CITRATE (PF) 250 MCG/5ML IJ SOLN
INTRAMUSCULAR | Status: AC
  Filled 2015-04-26: qty 5

## 2015-04-26 MED ORDER — METOPROLOL SUCCINATE ER 50 MG PO TB24
50.0000 mg | ORAL_TABLET | Freq: Every day | ORAL | Status: DC
  Administered 2015-04-26: 50 mg via ORAL
  Administered 2015-04-27: 25 mg via ORAL
  Filled 2015-04-26 (×2): qty 1

## 2015-04-26 MED ORDER — ACETAMINOPHEN 325 MG PO TABS: 650.0000 mg | ORAL_TABLET | Freq: Four times a day (QID) | ORAL | Status: DC | PRN

## 2015-04-26 MED ORDER — ASPIRIN EC 325 MG PO TBEC
325.0000 mg | DELAYED_RELEASE_TABLET | Freq: Every day | ORAL | Status: DC
  Administered 2015-04-27 – 2015-04-28 (×2): 325 mg via ORAL
  Filled 2015-04-26 (×2): qty 1

## 2015-04-26 MED ORDER — OXYCODONE HCL 5 MG PO TABS: 5.0000 mg | ORAL_TABLET | Freq: Once | ORAL | Status: DC | PRN

## 2015-04-26 MED ORDER — ASPIRIN EC 325 MG PO TBEC: 325.0000 mg | DELAYED_RELEASE_TABLET | Freq: Two times a day (BID) | ORAL | Status: DC

## 2015-04-26 MED ORDER — ALBUTEROL SULFATE (2.5 MG/3ML) 0.083% IN NEBU
3.0000 mL | INHALATION_SOLUTION | Freq: Four times a day (QID) | RESPIRATORY_TRACT | Status: DC | PRN
  Administered 2015-04-26: 3 mL via RESPIRATORY_TRACT

## 2015-04-26 MED ORDER — DIPHENHYDRAMINE HCL 12.5 MG/5ML PO ELIX: 12.5000 mg | ORAL_SOLUTION | ORAL | Status: DC | PRN

## 2015-04-26 SURGICAL SUPPLY — 69 items
BANDAGE ELASTIC 4 VELCRO ST LF (GAUZE/BANDAGES/DRESSINGS) ×2 IMPLANT
BANDAGE ELASTIC 6 VELCRO ST LF (GAUZE/BANDAGES/DRESSINGS) ×2 IMPLANT
BANDAGE ESMARK 6X9 LF (GAUZE/BANDAGES/DRESSINGS) ×1 IMPLANT
BLADE SAG 18X100X1.27 (BLADE) ×2 IMPLANT
BLADE SAW SGTL 13X75X1.27 (BLADE) ×2 IMPLANT
BLADE SURG ROTATE 9660 (MISCELLANEOUS) IMPLANT
BNDG CMPR 9X6 STRL LF SNTH (GAUZE/BANDAGES/DRESSINGS) ×1
BNDG CMPR MED 10X6 ELC LF (GAUZE/BANDAGES/DRESSINGS) ×1
BNDG ELASTIC 6X10 VLCR STRL LF (GAUZE/BANDAGES/DRESSINGS) ×2 IMPLANT
BNDG ESMARK 6X9 LF (GAUZE/BANDAGES/DRESSINGS) ×2
BOWL SMART MIX CTS (DISPOSABLE) ×2 IMPLANT
CAPT KNEE TOTAL 3 ATTUNE ×2 IMPLANT
CEMENT HV SMART SET (Cement) ×2 IMPLANT
COVER SURGICAL LIGHT HANDLE (MISCELLANEOUS) ×2 IMPLANT
CUFF TOURNIQUET SINGLE 34IN LL (TOURNIQUET CUFF) IMPLANT
CUFF TOURNIQUET SINGLE 44IN (TOURNIQUET CUFF) IMPLANT
DRAPE EXTREMITY T 121X128X90 (DRAPE) ×2 IMPLANT
DRAPE IMP U-DRAPE 54X76 (DRAPES) ×2 IMPLANT
DRAPE U-SHAPE 47X51 STRL (DRAPES) ×2 IMPLANT
DURAPREP 26ML APPLICATOR (WOUND CARE) ×4 IMPLANT
ELECT REM PT RETURN 9FT ADLT (ELECTROSURGICAL) ×2
ELECTRODE REM PT RTRN 9FT ADLT (ELECTROSURGICAL) ×1 IMPLANT
EVACUATOR 1/8 PVC DRAIN (DRAIN) IMPLANT
GAUZE SPONGE 4X4 12PLY STRL (GAUZE/BANDAGES/DRESSINGS) ×4 IMPLANT
GAUZE XEROFORM 1X8 LF (GAUZE/BANDAGES/DRESSINGS) ×2 IMPLANT
GAUZE XEROFORM 5X9 LF (GAUZE/BANDAGES/DRESSINGS) ×2 IMPLANT
GLOVE BIO SURGEON STRL SZ7.5 (GLOVE) ×2 IMPLANT
GLOVE BIO SURGEON STRL SZ8.5 (GLOVE) ×2 IMPLANT
GLOVE BIOGEL PI IND STRL 8 (GLOVE) ×1 IMPLANT
GLOVE BIOGEL PI IND STRL 9 (GLOVE) ×1 IMPLANT
GLOVE BIOGEL PI INDICATOR 8 (GLOVE) ×1
GLOVE BIOGEL PI INDICATOR 9 (GLOVE) ×1
GOWN STRL REUS W/ TWL LRG LVL3 (GOWN DISPOSABLE) ×1 IMPLANT
GOWN STRL REUS W/ TWL XL LVL3 (GOWN DISPOSABLE) ×2 IMPLANT
GOWN STRL REUS W/TWL LRG LVL3 (GOWN DISPOSABLE) ×2
GOWN STRL REUS W/TWL XL LVL3 (GOWN DISPOSABLE) ×4
HANDPIECE INTERPULSE COAX TIP (DISPOSABLE) ×2
HOOD PEEL AWAY FACE SHEILD DIS (HOOD) ×4 IMPLANT
KIT BASIN OR (CUSTOM PROCEDURE TRAY) ×2 IMPLANT
KIT ROOM TURNOVER OR (KITS) ×2 IMPLANT
MANIFOLD NEPTUNE II (INSTRUMENTS) ×2 IMPLANT
NDL SAFETY ECLIPSE 18X1.5 (NEEDLE) IMPLANT
NEEDLE 22X1 1/2 (OR ONLY) (NEEDLE) ×2 IMPLANT
NEEDLE HYPO 18GX1.5 SHARP (NEEDLE)
NEEDLE SPNL 18GX3.5 QUINCKE PK (NEEDLE) IMPLANT
NS IRRIG 1000ML POUR BTL (IV SOLUTION) ×2 IMPLANT
PACK TOTAL JOINT (CUSTOM PROCEDURE TRAY) ×2 IMPLANT
PACK UNIVERSAL I (CUSTOM PROCEDURE TRAY) ×2 IMPLANT
PAD ARMBOARD 7.5X6 YLW CONV (MISCELLANEOUS) ×4 IMPLANT
PAD CAST 4YDX4 CTTN HI CHSV (CAST SUPPLIES) ×1 IMPLANT
PADDING CAST COTTON 4X4 STRL (CAST SUPPLIES) ×1
PADDING CAST COTTON 6X4 STRL (CAST SUPPLIES) ×2 IMPLANT
SET HNDPC FAN SPRY TIP SCT (DISPOSABLE) ×1 IMPLANT
SPONGE GAUZE 4X4 12PLY STER LF (GAUZE/BANDAGES/DRESSINGS) ×2 IMPLANT
SUCTION FRAZIER TIP 10 FR DISP (SUCTIONS) ×2 IMPLANT
SUT VIC AB 0 CT1 27 (SUTURE) ×2
SUT VIC AB 0 CT1 27XBRD ANBCTR (SUTURE) ×1 IMPLANT
SUT VIC AB 1 CTX 36 (SUTURE) ×2
SUT VIC AB 1 CTX36XBRD ANBCTR (SUTURE) ×1 IMPLANT
SUT VIC AB 2-0 CT1 27 (SUTURE) ×2
SUT VIC AB 2-0 CT1 TAPERPNT 27 (SUTURE) ×1 IMPLANT
SUT VIC AB 3-0 CT1 27 (SUTURE) ×2
SUT VIC AB 3-0 CT1 TAPERPNT 27 (SUTURE) ×1 IMPLANT
SUT VIC AB 3-0 FS2 27 (SUTURE) ×2 IMPLANT
SYR 30ML LL (SYRINGE) ×2 IMPLANT
SYR 50ML LL SCALE MARK (SYRINGE) ×2 IMPLANT
TOWEL OR 17X24 6PK STRL BLUE (TOWEL DISPOSABLE) ×2 IMPLANT
TOWEL OR 17X26 10 PK STRL BLUE (TOWEL DISPOSABLE) ×2 IMPLANT
WATER STERILE IRR 1000ML POUR (IV SOLUTION) ×6 IMPLANT

## 2015-04-26 NOTE — Interval H&P Note (Signed)
History and Physical Interval Note:  04/26/2015 10:08 AM  Kyle [name]  has presented today for surgery, with the diagnosis of RIGHT KNEE OSTEOARTHRITIS   The various methods of treatment have been discussed with the patient and family. After consideration of risks, benefits and other options for treatment, the patient has consented to  Procedure(s) with comments: RIGHT TOTAL KNEE ARTHROPLASTY (Right) - RIGHT TOTAL KNEE ARTHROPLASTY as a surgical intervention .  The patient's history has been reviewed, patient examined, no change in status, stable for surgery.  I have reviewed the patient's chart and labs.  Questions were answered to the patient's satisfaction.     Kerin Salen

## 2015-04-26 NOTE — Discharge Instructions (Signed)

## 2015-04-26 NOTE — Transfer of Care (Signed)
Immediate Anesthesia Transfer of Care Note  Patient: Kyle [name]  Procedure(s) Performed: Procedure(s) with comments: RIGHT TOTAL KNEE ARTHROPLASTY (Right) - RIGHT TOTAL KNEE ARTHROPLASTY  Patient Location: PACU  Anesthesia Type:Spinal  Level of Consciousness: awake, alert , oriented, patient cooperative and responds to stimulation  Airway & Oxygen Therapy: Patient Spontanous Breathing and Patient connected to nasal cannula oxygen  Post-op Assessment: Report given to RN, Post -op Vital signs reviewed and stable and Patient able to stick tongue midline  Post vital signs: stable  Last Vitals:  Filed Vitals:   04/26/15 0846  BP: 132/80  Pulse: 74  Temp: 36.6 C  Resp: 20    Complications: No apparent anesthesia complications

## 2015-04-26 NOTE — Op Note (Signed)
PATIENT ID:      Kyle [name]  MRN:     [id number] DOB/AGE:    1946/04/17 / 69 y.o.       OPERATIVE REPORT    DATE OF PROCEDURE:  04/26/2015       PREOPERATIVE DIAGNOSIS:   RIGHT KNEE OSTEOARTHRITIS       Estimated body mass index is 26.08 kg/(m^2) as calculated from the following:   Height as of 04/16/15: 6\' 5"  (1.956 m).   Weight as of 03/26/15: 99.791 kg (220 lb).                                                        POSTOPERATIVE DIAGNOSIS:   RIGHT KNEE OSTEOARTHRITIS                                                                       PROCEDURE:  Procedure(s): RIGHT TOTAL KNEE ARTHROPLASTY Using DepuyAttune RP implants #9R Femur, #9Tibia, 5 mm Attune RP bearing, 41 Patella     SURGEON: Ashwath Lasch J    ASSISTANT:   [employer]   (Present and scrubbed throughout the case, critical for assistance with exposure, retraction, instrumentation, and closure.)         ANESTHESIA: Spinal, Exparel  EBL: 300  FLUID REPLACEMENT: 1600 crystalloid  TOURNIQUET TIME: 55min  Drains: None  Tranexamic Acid: 2mg  topical   COMPLICATIONS:  None         INDICATIONS FOR PROCEDURE: The patient has  RIGHT KNEE OSTEOARTHRITIS , varus deformities, XR shows bone on bone arthritis. Patient has failed all conservative measures including anti-inflammatory medicines, narcotics, attempts at  exercise and weight loss, cortisone injections and viscosupplementation.  Risks and benefits of surgery have been discussed, questions answered.   DESCRIPTION OF PROCEDURE: The patient identified by armband, received  IV antibiotics, in the holding area at [facility]. Patient taken to the operating room, appropriate anesthetic  monitors were attached, and general endotracheal anesthesia induced with  the patient in supine position. Tourniquet  applied high to the operative thigh. Lateral post and foot positioner  applied to the table, the lower extremity was then prepped and draped  in usual sterile  fashion from the ankle to the tourniquet. Time-out procedure was performed. We began the operation, with the knee flexed 100 degrees, by making the anterior midline incision starting at handbreadth above the patella going over the patella 1 cm medial to and 4 cm distal to the tibial tubercle. Small bleeders in the skin and the  subcutaneous tissue identified and cauterized. Transverse retinaculum was incised and reflected medially and a medial parapatellar arthrotomy was accomplished. the patella was everted and theprepatellar fat pad resected. The superficial medial collateral  ligament was then elevated from anterior to posterior along the proximal  flare of the tibia and anterior half of the menisci resected. The knee was hyperflexed exposing bone on bone arthritis. Peripheral and notch osteophytes as well as the cruciate ligaments were then resected. We continued to  work our way around posteriorly along the proximal tibia, and  externally  rotated the tibia subluxing it out from underneath the femur. A McHale  retractor was placed through the notch and a lateral Hohmann retractor  placed, and we then drilled through the proximal tibia in line with the  axis of the tibia followed by an intramedullary guide rod and 2-degree  posterior slope cutting guide. The tibial cutting guide, 3 degree posterior sloped, was pinned into place allowing resection of 6 mm of bone medially and about 11 mm of bone laterally. Satisfied with the tibial resection, we then  entered the distal femur 2 mm anterior to the PCL origin with the  intramedullary guide rod and applied the distal femoral cutting guide  set at 69mm, with 5 degrees of valgus. This was pinned along the  epicondylar axis. At this point, the distal femoral cut was accomplished without difficulty. We then sized for a #9R femoral component and pinned the guide in 3 degrees of external rotation.The chamfer cutting guide was pinned into place. The anterior,  posterior, and chamfer cuts were accomplished without difficulty followed by  the Attune RP box cutting guide and the box cut. We also removed posterior osteophytes from the posterior femoral condyles. At this  time, the knee was brought into full extension. We checked our  extension and flexion gaps and found them symmetric for a 5 mm bearing. Distracting in extension with a lamina spreader, the posterior horns of the menisci were removed, and Exparel, diluted to 60 cc, was injected into the capsule of the knee. The  posterior patella cut was accomplished with the 9.5 mm Attune cutting guide, sized at 41* dome, and the fixation pegs drilled. The pre-op no displaced vertical lateral facet patell fx did not involve the patellar trial. The knee was then once again hyperflexed exposing the proximal tibia. We sized for a #9 tibial base plate, applied the smokestack and the conical reamer followed by the the Delta fin keel punch. We then hammered into place the Attune RP trial femoral component, inserted a  5 mm trial bearing, trial patellar button, and took the knee through range of motion from 0-130 degrees. No thumb pressure was required for patellar Tracking. At this point, the limb was wrapped with an Esmarch bandage and the tourniquet inflated to 350 mmHg. All trial components were removed, mating surfaces irrigated with pulse lavage, and dried with suction and sponges. A double batch of DePuy HV cement with 1500 mg of Zinacef was mixed and applied to all bony metallic mating surfaces except for the posterior condyles of the femur itself. In order, we  hammered into place the tibial tray and removed excess cement, the femoral component and removed excess cement,  The 51mm  Attune RP bearing  was inserted, and the knee brought to full extension with compression.  The patellar button was clamped into place, and excess cement  removed. While the cement cured the wound was irrigated out with normal saline  solution pulse lavage. Ligament stability and patellar tracking were checked and found to be excellent. The parapatellar arthrotomy was closed with  running #1 Vicryl suture. The subcutaneous tissue with 0 and 2-0 undyed  Vicryl suture, and the skin with running 3-0 SQ vicryl. A dressing of Xeroform,  4 x 4, dressing sponges, Webril, and Ace wrap applied. The patient  awakened, extubated, and taken to recovery room without difficulty.   Kerin Salen 04/26/2015, 12:36 PM

## 2015-04-26 NOTE — Progress Notes (Signed)
Patient awaits PT/OT evaluation.

## 2015-04-26 NOTE — Care Management Note (Signed)
Case Management Note  Patient Details  Name: Kyle [name] MRN: [id number] Date of Birth: 04-21-1946  Subjective/Objective:      S/p    RIGHT TOTAL KNEE ARTHROPLASTY         Action/Plan: Awaits PT/OT evaluation  Expected Discharge Date:                  Expected Discharge Plan:     In-House Referral:     Discharge planning Services  CM Consult  Post Acute Care Choice:    Choice offered to:     DME Arranged:    DME Agency:     HH Arranged:    HH Agency:     Status of Service:  In process, will continue to follow  Medicare Important Message Given:    Date Medicare IM Given:    Medicare IM give by:    Date Additional Medicare IM Given:    Additional Medicare Important Message give by:     If discussed at [place] of Stay Meetings, dates discussed:    Additional Comments:  Dimas Aguas, RN 04/26/2015, 7:53 PM

## 2015-04-26 NOTE — Anesthesia Procedure Notes (Signed)
Spinal Patient location during procedure: OR Start time: 04/26/2015 11:04 AM End time: 04/26/2015 11:15 AM Staffing Anesthesiologist: HODIERNE, ADAM Performed by: anesthesiologist  Preanesthetic Checklist Completed: patient identified, site marked, surgical consent, pre-op evaluation, timeout performed, IV checked, risks and benefits discussed and monitors and equipment checked Spinal Block Patient position: sitting Prep: Betadine and site prepped and draped Patient monitoring: heart rate, cardiac monitor, continuous pulse ox and blood pressure Approach: midline Location: L3-4 Injection technique: single-shot Needle Needle type: Pencan  Needle gauge: 24 G Needle length: 10 cm Assessment Sensory level: T6 Additional Notes Pt tolerated the procedure well.

## 2015-04-26 NOTE — Anesthesia Preprocedure Evaluation (Signed)
Anesthesia Evaluation  Patient identified by MRN, date of birth, ID band Patient awake    Reviewed: Allergy & Precautions, NPO status , Patient's Chart, lab work & pertinent test results  Airway Mallampati: III   Neck ROM: limited    Dental   Pulmonary asthma ,  breath sounds clear to auscultation        Cardiovascular hypertension, + CAD Rhythm:regular Rate:Normal  Mild non-obstructive CAD seen on cath 2014.  Cleared for surgery by cardiologist.   Neuro/Psych    GI/Hepatic GERD-  ,  Endo/Other  diabetes, Type 2  Renal/GU      Musculoskeletal  (+) Arthritis -,   Abdominal   Peds  Hematology   Anesthesia Other Findings   Reproductive/Obstetrics                             Anesthesia Physical Anesthesia Plan  ASA: II  Anesthesia Plan: MAC and Spinal   Post-op Pain Management:    Induction: Intravenous  Airway Management Planned: Simple Face Mask  Additional Equipment:   Intra-op Plan:   Post-operative Plan:   Informed Consent: I have reviewed the patients History and Physical, chart, labs and discussed the procedure including the risks, benefits and alternatives for the proposed anesthesia with the patient or authorized representative who has indicated his/her understanding and acceptance.     Plan Discussed with: CRNA, Anesthesiologist and Surgeon  Anesthesia Plan Comments:         Anesthesia Quick Evaluation

## 2015-04-26 NOTE — Care Management (Signed)
Utilization review completed by Aleyza Salmi N. Noora Locascio, RN BSN 

## 2015-04-26 NOTE — Progress Notes (Signed)
Orthopedic Tech Progress Note Patient Details:  Kyle [name] Jun 21, 1946 [id number] Applied CPM to RLE. CPM Right Knee CPM Right Knee: On Right Knee Flexion (Degrees): 40 Right Knee Extension (Degrees): 10   Kyle [name] 04/26/2015, 2:00 PM

## 2015-04-26 NOTE — Progress Notes (Signed)
BLadder scan x 3 shows 800-900 mls on screen --->I/o cath resulted in 102mls clear yellow urine/ "I feel so much better" stes afterwards / dr Ermalene Postin at bedside to reassure him re his concern of  His "inability to feel my genitals" and the saddle effect of the epidural,  Yet he demonstrates regression of sxs/  Bed changed and pt cleaned of urine as he had been leaking from bladder/cpm off temporarily and add'l pain meds initiated

## 2015-04-27 ENCOUNTER — Encounter (HOSPITAL_COMMUNITY): Payer: Self-pay | Admitting: Orthopedic Surgery

## 2015-04-27 LAB — CBC
HCT: 34.2 % — ABNORMAL LOW (ref 39.0–52.0)
Hemoglobin: 12 g/dL — ABNORMAL LOW (ref 13.0–17.0)
MCH: 30.5 pg (ref 26.0–34.0)
MCHC: 35.1 g/dL (ref 30.0–36.0)
MCV: 87 fL (ref 78.0–100.0)
PLATELETS: 305 10*3/uL (ref 150–400)
RBC: 3.93 MIL/uL — AB (ref 4.22–5.81)
RDW: 13.5 % (ref 11.5–15.5)
WBC: 10.8 10*3/uL — ABNORMAL HIGH (ref 4.0–10.5)

## 2015-04-27 LAB — BASIC METABOLIC PANEL
ANION GAP: 4 — AB (ref 5–15)
BUN: 8 mg/dL (ref 6–20)
CALCIUM: 8 mg/dL — AB (ref 8.9–10.3)
CO2: 28 mmol/L (ref 22–32)
Chloride: 96 mmol/L — ABNORMAL LOW (ref 101–111)
Creatinine, Ser: 0.81 mg/dL (ref 0.61–1.24)
GLUCOSE: 189 mg/dL — AB (ref 65–99)
Potassium: 3.9 mmol/L (ref 3.5–5.1)
SODIUM: 128 mmol/L — AB (ref 135–145)

## 2015-04-27 NOTE — Progress Notes (Addendum)
Patient ID: Kyle [name], male   DOB: August 05, 1946, 69 y.o.   MRN: [id number] PATIENT ID: Kyle [name]  MRN: [id number]  DOB/AGE:  [birth year] / 69 y.o.  1 Day Post-Op Procedure(s) (LRB): RIGHT TOTAL KNEE ARTHROPLASTY (Right)    PROGRESS NOTE Subjective: Patient is alert, oriented, x1 Nausea, no Vomiting, yes passing gas, no Bowel Movement. Taking PO well. Denies SOB, Chest or Calf Pain. Using Incentive Spirometer, PAS in place. Mild sore throat. Ambulate in room, CPM 0-40 Patient reports pain as 2 on 0-10 scale  .    Objective: Vital signs in last 24 hours: Filed Vitals:   04/26/15 1953 04/26/15 2157 04/27/15 0105 04/27/15 0415  BP: 124/67 160/79 127/72 129/73  Pulse: 77 99 86 81  Temp: 98.1 F (36.7 C) 98.9 F (37.2 C) 98.2 F (36.8 C) 98.4 F (36.9 C)  TempSrc: Oral Oral Oral Oral  Resp: 18 18 18 18   SpO2: 99% 99% 96% 96%      Intake/Output from previous day: I/O last 3 completed shifts: In: 4218.8 [P.O.:600; I.V.:3618.8] Out: 2975 [Urine:2675; Blood:300]   Intake/Output this shift:     LABORATORY DATA:  Recent Labs  04/26/15 0845 04/26/15 1047 04/26/15 1336  GLUCAP 121* 117* 92    Examination: Neurologically intact ABD soft Neurovascular intact Sensation intact distally Intact pulses distally Dorsiflexion/Plantar flexion intact Incision: dressing C/D/I No cellulitis present Compartment soft}  Assessment:   1 Day Post-Op Procedure(s) (LRB): RIGHT TOTAL KNEE ARTHROPLASTY (Right) ADDITIONAL DIAGNOSIS: Expected Acute Blood Loss Anemia, Diabetes and Hypertension  Plan: PT/OT WBAT, CPM 5/hrs day until ROM 0-90 degrees, then D/C CPM DVT Prophylaxis:  SCDx72hrs, ASA 325 mg BID x 2 weeks DISCHARGE PLAN: Home DISCHARGE NEEDS: HHPT, CPM, Walker and 3-in-1 comode seat     Seth Higginbotham J 04/27/2015, 7:35 AM

## 2015-04-27 NOTE — Progress Notes (Signed)
Orthopedic Tech Progress Note Patient Details:  Kyle [name] 06-20-1946 [id number] Patient placed in by nursing CPM Right Knee CPM Right Knee: On Right Knee Flexion (Degrees): 40 Right Knee Extension (Degrees): 10 Additional Comments: yellow foam under Rt heel   [name] 04/27/2015, 3:10 PM

## 2015-04-27 NOTE — Progress Notes (Signed)
Occupational Therapy Evaluation Patient Details Name: Kyle [name] MRN: [id number] DOB: 04/12/1946 Today's Date: 04/27/2015    History of Present Illness Pt is a 69 y/o M s/p R TKA.  Pt's PMH includes shoulder arthritis, post tibial tendonitis, HTN, DM, CAD, asthma, anemia, weakness of R arm, L THA.   Clinical Impression   Pt admitted with the above diagnoses and presents with below problem list. Pt will benefit from continued acute OT to address the below listed deficits and maximize independence with BADLs prior to d/c to venue below. PTA pt was independent with ADLs. Pt is currently min guard for LB ADLs and min A with bed mobility. ADLs completed and education provided as detailed below. OT to continue to follow acutely.      Follow Up Recommendations  Supervision/Assistance - 24 hour;No OT follow up    Equipment Recommendations  None recommended by OT    Recommendations for Other Services       Precautions / Restrictions Precautions Precautions: Knee;Fall Precaution Booklet Issued: Yes (comment) Precaution Comments: Reviewed no pillow under knee Restrictions Weight Bearing Restrictions: Yes RLE Weight Bearing: Weight bearing as tolerated      Mobility Bed Mobility Overal bed mobility: Needs Assistance Bed Mobility: Sit to Supine     Supine to sit: Min assist Sit to supine: Min assist   General bed mobility comments: Assist to facilitate pt's efforts to use LLE to support RLE to advance onto bed surface. Cues for technique,  Transfers Overall transfer level: Needs assistance Equipment used: Rolling walker (2 wheeled) Transfers: Sit to/from Stand Sit to Stand: Min guard;From elevated surface         General transfer comment: Difficulty managing RLE during stand>sit due to pain, decreased knee flexion.     Balance Overall balance assessment: Needs assistance Sitting-balance support: Bilateral upper extremity supported;Feet supported Sitting balance-Leahy  Scale: Good     Standing balance support: Bilateral upper extremity supported;During functional activity Standing balance-Leahy Scale: Fair                              ADL Overall ADL's : Needs assistance/impaired Eating/Feeding: Set up;Sitting   Grooming: Set up;Sitting   Upper Body Bathing: Set up;Sitting   Lower Body Bathing: Min guard;Sit to/from stand;With adaptive equipment   Upper Body Dressing : Set up;Sitting   Lower Body Dressing: Min guard;With adaptive equipment;Sit to/from stand   Toilet Transfer: Min guard;Ambulation;RW (3n1 over toilet)   Toileting- Clothing Manipulation and Hygiene: Min guard;Sit to/from stand   Tub/ Shower Transfer: Min guard;Ambulation;3 in 1;Rolling walker   Functional mobility during ADLs: Min guard;Rolling walker General ADL Comments: Pt completed in-room ambulation and practiced sit<>stand transfers from EOB with elevated height 1x at min guard level. Pt completd bed mobility as detailed below. ADL education provided including AE and techniques.      Vision     Perception     Praxis      Pertinent Vitals/Pain Pain Assessment: 0-10 Pain Score: 7  Pain Location: R knee Pain Descriptors / Indicators: Aching;Sore Pain Intervention(s): Limited activity within patient's tolerance;Monitored during session;Repositioned;Ice applied     Hand Dominance     Extremity/Trunk Assessment Upper Extremity Assessment Upper Extremity Assessment: Overall WFL for tasks assessed   Lower Extremity Assessment Lower Extremity Assessment: Defer to PT evaluation RLE Deficits / Details: weakness and limited ROM as expected s/p R TKA RLE Sensation:  (WNL)  Communication Communication Communication: No difficulties   Cognition Arousal/Alertness: Awake/alert Behavior During Therapy: WFL for tasks assessed/performed Overall Cognitive Status: Within Functional Limits for tasks assessed                     General  Comments       Exercises     Shoulder Instructions      Home Living Family/patient expects to be discharged to:: Private residence Living Arrangements: Spouse/significant other Available Help at Discharge: Family;Available 24 hours/day Type of Home: House Home Access: Ramped entrance     Home Layout: Multi-level;Other (Comment)     Bathroom Shower/Tub: Occupational psychologist: Standard     Home Equipment: Environmental consultant - 2 wheels;Bedside commode;Adaptive equipment Adaptive Equipment: Reacher;Sock aid;Long-handled shoe horn;Long-handled sponge        Prior Functioning/Environment Level of Independence: Independent             OT Diagnosis: Acute pain   OT Problem List: Impaired balance (sitting and/or standing);Decreased knowledge of use of DME or AE;Decreased knowledge of precautions;Pain   OT Treatment/Interventions: Self-care/ADL training;DME and/or AE instruction;Therapeutic activities;Patient/family education;Balance training    OT Goals(Current goals can be found in the care plan section) Acute Rehab OT Goals Patient Stated Goal: to go home OT Goal Formulation: With patient Time For Goal Achievement: 05/04/15 Potential to Achieve Goals: Good ADL Goals Pt Will Perform Lower Body Bathing: with modified independence;with adaptive equipment;sit to/from stand Pt Will Perform Lower Body Dressing: with modified independence;with adaptive equipment;sit to/from stand Pt Will Transfer to Toilet: with modified independence;ambulating (3n1 over toilet) Pt Will Perform Toileting - Clothing Manipulation and hygiene: with modified independence;sit to/from stand Pt Will Perform Tub/Shower Transfer: Shower transfer;with modified independence;ambulating;3 in 1;rolling walker Additional ADL Goal #1: Pt will complete bed mobility at mod I level to prepare for OOB ADLs.  OT Frequency: Min 2X/week   Barriers to D/C:            Co-evaluation              End of  Session Equipment Utilized During Treatment: Rolling walker CPM Right Knee Additional Comments: yellow foam under Rt heel  Activity Tolerance: Patient tolerated treatment well Patient left: in bed;with call bell/phone within reach   Time: 1207-1233 OT Time Calculation (min): 26 min Charges:  OT General Charges $OT Visit: 1 Procedure OT Evaluation $Initial OT Evaluation Tier I: 1 Procedure OT Treatments $Self Care/Home Management : 8-22 mins G-Codes:    Hortencia Pilar April 28, 2015, 12:52 PM

## 2015-04-27 NOTE — Progress Notes (Signed)
Physical Therapy Treatment Patient Details Name: Kyle [name] MRN: [id number] DOB: 1946/07/06 Today's Date: 04/27/2015    History of Present Illness Pt is a 69 y/o M s/p R TKA.  Pt's PMH includes shoulder arthritis, post tibial tendonitis, HTN, DM, CAD, asthma, anemia, weakness of R arm, L THA.    PT Comments    Pt politely requested to limit session to therapeutic exercises as pt has ambulated in hallway multiple times today.  Pt progressed w/ exercises this session.  Pt will benefit from continued skilled PT services to increase functional independence and safety.   Follow Up Recommendations  Home health PT;Supervision for mobility/OOB     Equipment Recommendations  None recommended by PT    Recommendations for Other Services       Precautions / Restrictions Precautions Precautions: Knee;Fall Precaution Comments: Reviewed no pillow under knee Restrictions Weight Bearing Restrictions: Yes RLE Weight Bearing: Weight bearing as tolerated    Mobility  Bed Mobility Overal bed mobility: Modified Independent Bed Mobility: Sit to Supine;Supine to Sit     Supine to sit: Modified independent (Device/Increase time) Sit to supine: Modified independent (Device/Increase time)   General bed mobility comments: Leg hook technique for supine<>sit w/ use of bed rails during supine>sit.  Increased time and VCs to avoid valsalva maneuver.  Transfers                    Ambulation/Gait                 Stairs            Wheelchair Mobility    Modified Rankin (Stroke Patients Only)       Balance Overall balance assessment: Needs assistance Sitting-balance support: Bilateral upper extremity supported;Feet supported Sitting balance-Leahy Scale: Good                              Cognition Arousal/Alertness: Awake/alert Behavior During Therapy: WFL for tasks assessed/performed Overall Cognitive Status: Within Functional Limits for tasks  assessed                      Exercises Total Joint Exercises Ankle Circles/Pumps: AROM;Both;10 reps;Supine Quad Sets: AROM;Both;10 reps;Supine Short Arc Quad: AAROM;Right;5 reps;Supine Heel Slides: AROM;Right;5 reps;Supine Long Arc Quad: AAROM;Right;5 reps;Seated Knee Flexion: AROM;Right;5 reps;Seated    General Comments        Pertinent Vitals/Pain Pain Assessment: 0-10 Pain Score: 6  Pain Location: R knee Pain Descriptors / Indicators: Aching;Grimacing Pain Intervention(s): Limited activity within patient's tolerance;Monitored during session;Repositioned    Home Living Family/patient expects to be discharged to:: Private residence Living Arrangements: Spouse/significant other                  Prior Function            PT Goals (current goals can now be found in the care plan section) Acute Rehab PT Goals Patient Stated Goal: to go home PT Goal Formulation: With patient Time For Goal Achievement: 05/04/15 Potential to Achieve Goals: Good Progress towards PT goals: Progressing toward goals    Frequency  7X/week    PT Plan Current plan remains appropriate    Co-evaluation             End of Session   Activity Tolerance: Patient tolerated treatment well Patient left: in bed;with call bell/phone within reach     Time: 1602-1622 PT Time Calculation (min) (ACUTE ONLY):  20 min  Charges:  $Therapeutic Exercise: 8-22 mins                    G Codes:      Joslyn Hy PT, [place] [phone] Pager: [phone] 04/27/2015, 4:47 PM

## 2015-04-27 NOTE — Anesthesia Postprocedure Evaluation (Signed)
  Anesthesia Post-op Note  Patient: Kyle [name]  Procedure(s) Performed: Procedure(s) with comments: RIGHT TOTAL KNEE ARTHROPLASTY (Right) - RIGHT TOTAL KNEE ARTHROPLASTY  Patient Location: PACU  Anesthesia Type:Spinal  Level of Consciousness: awake  Airway and Oxygen Therapy: Patient Spontanous Breathing  Post-op Pain: mild  Post-op Assessment: Post-op Vital signs reviewed, Patient's Cardiovascular Status Stable, Respiratory Function Stable, Patent Airway, No signs of Nausea or vomiting and Pain level controlled LLE Motor Response: Purposeful movement (Moves toes and foot) LLE Sensation: Numbness RLE Motor Response: Purposeful movement (sl. movement of toes and foot) RLE Sensation: Pain L Sensory Level:  (intact x "can't feel my genitals") R Sensory Level:  (only dullness is in saddle area)  Post-op Vital Signs: Reviewed and stable  Last Vitals:  Filed Vitals:   04/27/15 0415  BP: 129/73  Pulse: 81  Temp: 36.9 C  Resp: 18    Complications: No apparent anesthesia complications

## 2015-04-27 NOTE — Evaluation (Signed)
Physical Therapy Evaluation Patient Details Name: Kyle [name] MRN: [id number] DOB: Apr 12, 1946 Today's Date: 04/27/2015   History of Present Illness  Pt is a 69 y/o M s/p R TKA.  Pt's PMH includes shoulder arthritis, post tibial tendonitis, HTN, DM, CAD, asthma, anemia, weakness of R arm, L THA.  Clinical Impression  Pt is s/p R TKA resulting in the deficits listed below (see PT Problem List). Pt ambulated 14 ft w/ RW and completed therapeutic exercises this session.  Pt w/ difficulty controlling descent to sit in recliner 2/2 limited R knee F 2/2 pain. Pt will benefit from skilled PT to increase their independence and safety with mobility to allow discharge to the venue listed below.     Follow Up Recommendations Home health PT;Supervision for mobility/OOB    Equipment Recommendations  None recommended by PT    Recommendations for Other Services OT consult     Precautions / Restrictions Precautions Precautions: Knee;Fall Precaution Booklet Issued: Yes (comment) Precaution Comments: Reviewed no pillow under knee Restrictions Weight Bearing Restrictions: Yes RLE Weight Bearing: Weight bearing as tolerated      Mobility  Bed Mobility Overal bed mobility: Needs Assistance Bed Mobility: Supine to Sit     Supine to sit: Min assist     General bed mobility comments: Min assist managing RLE.  Increased time.  Transfers Overall transfer level: Needs assistance Equipment used: Rolling walker (2 wheeled) Transfers: Sit to/from Stand Sit to Stand: Min guard         General transfer comment: Cues for hand placement.  Pt w/ difficulty controlling descent to sit 2/2 severe pain w/ last 20 deg of R knee flexion.  Ambulation/Gait Ambulation/Gait assistance: Min guard Ambulation Distance (Feet): 80 Feet Assistive device: Rolling walker (2 wheeled) Gait Pattern/deviations: Step-to pattern;Antalgic;Decreased stride length;Decreased stance time - right;Decreased weight shift to  right;Trunk flexed   Gait velocity interpretation: Below normal speed for age/gender General Gait Details: trunk flexed which improved w/ VCs to stand upright.  Inc WB through [name] to offload RLE.  Stairs            Wheelchair Mobility    Modified Rankin (Stroke Patients Only)       Balance Overall balance assessment: Needs assistance Sitting-balance support: Bilateral upper extremity supported;Feet supported Sitting balance-Leahy Scale: Good     Standing balance support: Bilateral upper extremity supported;During functional activity Standing balance-Leahy Scale: Fair                               Pertinent Vitals/Pain Pain Assessment: 0-10 Pain Score: 8  Pain Location: R knee Pain Descriptors / Indicators: Aching Pain Intervention(s): Limited activity within patient's tolerance;Monitored during session;Repositioned;RN gave pain meds during session;Ice applied    Home Living Family/patient expects to be discharged to:: Private residence Living Arrangements: Spouse/significant other Available Help at Discharge: Family;Available 24 hours/day Type of Home: House Home Access: Ramped entrance     Home Layout: Multi-level;Other (Comment) (has elevator) Home Equipment: Walker - 2 wheels;Bedside commode      Prior Function Level of Independence: Independent               Hand Dominance        Extremity/Trunk Assessment               Lower Extremity Assessment: RLE deficits/detail RLE Deficits / Details: weakness and limited ROM as expected s/p R TKA       Communication  Communication: No difficulties  Cognition Arousal/Alertness: Awake/alert Behavior During Therapy: WFL for tasks assessed/performed Overall Cognitive Status: Within Functional Limits for tasks assessed                      General Comments      Exercises Total Joint Exercises Ankle Circles/Pumps: AROM;Both;10 reps;Supine Quad Sets: AROM;Both;10  reps;Supine Knee Flexion: AROM;Right;5 reps;Seated Goniometric ROM: 8-94      Assessment/Plan    PT Assessment Patient needs continued PT services  PT Diagnosis Difficulty walking;Abnormality of gait;Generalized weakness;Acute pain   PT Problem List Decreased strength;Decreased range of motion;Decreased activity tolerance;Decreased balance;Decreased mobility;Decreased coordination;Decreased knowledge of use of DME;Decreased safety awareness;Decreased knowledge of precautions;Decreased skin integrity;Pain  PT Treatment Interventions DME instruction;Gait training;Stair training;Functional mobility training;Therapeutic activities;Therapeutic exercise;Balance training;Neuromuscular re-education;Patient/family education;Modalities   PT Goals (Current goals can be found in the Care Plan section) Acute Rehab PT Goals Patient Stated Goal: to go home PT Goal Formulation: With patient/family Time For Goal Achievement: 05/04/15 Potential to Achieve Goals: Good    Frequency 7X/week   Barriers to discharge        Co-evaluation               End of Session Equipment Utilized During Treatment: Gait belt Activity Tolerance: Patient tolerated treatment well;Patient limited by pain Patient left: in chair;with call bell/phone within reach;with family/visitor present Nurse Communication: Mobility status;Precautions;Weight bearing status         Time: 0924-1001 PT Time Calculation (min) (ACUTE ONLY): 37 min   Charges:   PT Evaluation $Initial PT Evaluation Tier I: 1 Procedure PT Treatments $Gait Training: 8-22 mins   PT G CodesJoslyn Hy PT, DPT [phone] Pager: [phone] 04/27/2015, 11:00 AM

## 2015-04-28 LAB — CBC
HCT: 29.3 % — ABNORMAL LOW (ref 39.0–52.0)
Hemoglobin: 9.9 g/dL — ABNORMAL LOW (ref 13.0–17.0)
MCH: 29.4 pg (ref 26.0–34.0)
MCHC: 33.8 g/dL (ref 30.0–36.0)
MCV: 86.9 fL (ref 78.0–100.0)
Platelets: 272 10*3/uL (ref 150–400)
RBC: 3.37 MIL/uL — ABNORMAL LOW (ref 4.22–5.81)
RDW: 13.6 % (ref 11.5–15.5)
WBC: 10.2 10*3/uL (ref 4.0–10.5)

## 2015-04-28 NOTE — Progress Notes (Signed)
Spoke with patient and his wife on 04/27/15 about HHPT and gave them the [facility] list of home health agencies they stated that they did not want to decide on an agency today. Spoke with patient and his wife on 04/28/15 about HHPT, explained what HHPT would work on with the patient. Patient and wife stated that they wanted the patient to go to outpatient PT and refused HHPT. Explained that I would notify Dr. Damita Dunnings office. Called Dr. Angeline Slim with [facility] and explained that patient is refusing HHPT and stating that he would like outpatient PT instead. [employer] is providing CPM at home and has delivered rolling walker to the room. Patient states that he has a rolling walker at home.

## 2015-04-28 NOTE — Progress Notes (Signed)
PT Treatment Note     04/28/15 1100  PT Visit Information  Last PT Received On 04/28/15  Assistance Needed +1  History of Present Illness Pt is a 69 y/o M s/p R TKA.  Pt's PMH includes shoulder arthritis, post tibial tendonitis, HTN, DM, CAD, asthma, anemia, weakness of R arm, L THA.  PT Time Calculation  PT Start Time (ACUTE ONLY) [phone]  PT Stop Time (ACUTE ONLY) 1018  PT Time Calculation (min) (ACUTE ONLY) 57 min  Subjective Data  Subjective Pt and pt's wife express concern about getting in and out of car.    Patient Stated Goal to get in/out of car safely and to go home  Precautions  Precautions Knee;Fall  Precaution Comments Reviewed no pillow under knee  Restrictions  Weight Bearing Restrictions Yes  RLE Weight Bearing WBAT  Pain Assessment  Pain Assessment 0-10  Pain Score 3  Pain Location R knee  Pain Descriptors / Indicators Aching;Grimacing;Guarding  Pain Intervention(s) Limited activity within patient's tolerance;Monitored during session;Repositioned  Cognition  Arousal/Alertness Awake/alert  Behavior During Therapy Anxious  Overall Cognitive Status Within Functional Limits for tasks assessed  Bed Mobility  Overal bed mobility Modified Independent  Bed Mobility Supine to Sit  Supine to sit Modified independent (Device/Increase time)  General bed mobility comments Leg hook technique w/ use of bed rail and increased time.  Transfers  Overall transfer level Needs assistance  Equipment used Rolling walker (2 wheeled)  Transfers Sit to/from Stand  Sit to Stand Modified independent (Device/Increase time);From elevated surface  General transfer comment Practiced sit<>stand transfer x4 from elevated bed to simulate car transfer which pt was able to complete w/ mod I and increased time.  VCs for technique   Ambulation/Gait  Ambulation/Gait assistance Supervision  Ambulation Distance (Feet) 90 Feet  Assistive device Rolling walker (2 wheeled)  General Gait Details VCs to  relax shoulders and dec WB through BUEs and to stand upright.  Gait Pattern/deviations Step-to pattern;Step-through pattern;Antalgic;Trunk flexed;Decreased stride length  Gait velocity interpretation Below normal speed for age/gender  Balance  Overall balance assessment Needs assistance  Sitting-balance support No upper extremity supported;Feet supported  Sitting balance-Leahy Scale Good  Standing balance support During functional activity;Bilateral upper extremity supported  Standing balance-Leahy Scale Fair  General Comments  General comments (skin integrity, edema, etc.) At start of session educated pt and pt's wife verbally on proper technique for car transfer.  Pt's wife walked out during beginning of session saying she "needed to go for a walk". Encourage pt's wife to stay during session so she would know safe technique to assist pt at home but pt's wife left room.   Exercises  Exercises Total Joint  Total Joint Exercises  Ankle Circles/Pumps AROM;Both;10 reps;Supine  Quad Sets AROM;Both;10 reps;Supine  Knee Flexion AROM;Right;5 reps;Seated  Goniometric ROM [address] [place];Right;5 reps;Seated  PT - End of Session  Equipment Utilized During Treatment Gait belt  Activity Tolerance Patient tolerated treatment well  Patient left in chair;with call bell/phone within reach  Nurse Communication Mobility status  PT - Assessment/Plan  PT Plan Current plan remains appropriate  PT Frequency (ACUTE ONLY) 7X/week  Follow Up Recommendations Home health PT;Supervision for mobility/OOB  PT equipment None recommended by PT  PT Goal Progression  Progress towards PT goals Progressing toward goals  Acute Rehab PT Goals  PT Goal Formulation With patient  Time For Goal Achievement 05/04/15  Potential to Achieve Goals Good  PT General Charges  $$ ACUTE PT VISIT  1 Procedure  PT Treatments  $Gait Training 8-22 mins  $Therapeutic Exercise 23-37 mins  $Therapeutic Activity 8-22 mins     Assessment:  Educated, demonstrated, and pt practiced proper technique for car transfer x4 this session. Pt ambulated in hallway w/ supervision. Pt's wife refused to remain in room during session and was therefore not present for education provided.  Pt verbalized understanding of car transfer at end of session.  Pt will benefit from continued skilled PT services to increase functional independence and safety.   Joslyn Hy PT, DPT [phone] Pager: [phone]

## 2015-04-28 NOTE — Progress Notes (Signed)
PATIENT ID: Kyle [name]  MRN: [id number]  DOB/AGE:  68/01/1946 / 69 y.o.  2 Days Post-Op Procedure(s) (LRB): RIGHT TOTAL KNEE ARTHROPLASTY (Right)    PROGRESS NOTE Subjective: Patient is alert, oriented, no Nausea, no Vomiting, yes passing gas, no Bowel Movement. Taking PO well with pt up and eating in his bed. Denies SOB, Chest or Calf Pain. Using Incentive Spirometer, PAS in place. Ambulate WBAT with pt walking 80 ft with therapy, CPM 0-40 Patient reports pain as mild  .    Objective: Vital signs in last 24 hours: Filed Vitals:   04/27/15 1519 04/27/15 2110 04/27/15 2141 04/28/15 0554  BP: 123/69 110/52 118/64 104/66  Pulse: 72 75 77 66  Temp: 99.4 F (37.4 C)  98.9 F (37.2 C) 98.6 F (37 C)  TempSrc: Oral  Oral   Resp: 18  18 16   Height:      Weight:      SpO2: 95%  97% 96%      Intake/Output from previous day: I/O last 3 completed shifts: In: 4401 [P.O.:1200; I.V.:2375] Out: 3700 [Urine:3700]   Intake/Output this shift:     LABORATORY DATA:  Recent Labs  04/26/15 0845 04/26/15 1047 04/26/15 1336 04/27/15 0824 04/28/15 0539  WBC  --   --   --  10.8* 10.2  HGB  --   --   --  12.0* 9.9*  HCT  --   --   --  34.2* 29.3*  PLT  --   --   --  305 272  NA  --   --   --  128*  --   K  --   --   --  3.9  --   CL  --   --   --  96*  --   CO2  --   --   --  28  --   BUN  --   --   --  8  --   CREATININE  --   --   --  0.81  --   GLUCOSE  --   --   --  189*  --   GLUCAP 121* 117* 92  --   --   CALCIUM  --   --   --  8.0*  --     Examination: Neurologically intact Neurovascular intact Sensation intact distally Intact pulses distally Dorsiflexion/Plantar flexion intact Incision: dressing C/D/I No cellulitis present Compartment soft}  Assessment:   2 Days Post-Op Procedure(s) (LRB): RIGHT TOTAL KNEE ARTHROPLASTY (Right) ADDITIONAL DIAGNOSIS: Expected Acute Blood Loss Anemia, Diabetes and Hypertension  Plan: PT/OT WBAT, CPM 5/hrs day until ROM 0-90  degrees, then D/C CPM DVT Prophylaxis:  SCDx72hrs, ASA 325 mg BID x 2 weeks DISCHARGE PLAN: Home, when pt meets therapy goals. DISCHARGE NEEDS: HHPT, HHRN, CPM, Walker and 3-in-1 comode seat     Lilyanne Mcquown R 04/28/2015, 7:47 AM

## 2015-04-28 NOTE — Progress Notes (Signed)
Pt discharged to home with wife. All discharge teachings done. Pt and wife verbalize understanding of all discharge teachings. All questions answered.

## 2015-04-28 NOTE — Progress Notes (Signed)
PT Treatment Note     04/28/15 1158  PT Visit Information  Last PT Received On 04/28/15  Assistance Needed +1  History of Present Illness Pt is a 69 y/o M s/p R TKA.  Pt's PMH includes shoulder arthritis, post tibial tendonitis, HTN, DM, CAD, asthma, anemia, weakness of R arm, L THA.  PT Time Calculation  PT Start Time (ACUTE ONLY) 1114  PT Stop Time (ACUTE ONLY) 1137  PT Time Calculation (min) (ACUTE ONLY) 23 min  Subjective Data  Subjective Per RN pt and pt's wife remain concerned about car transfer and would like to review the proper technique again.  Patient Stated Goal to get in/out of car safely and to go home  Precautions  Precautions Knee;Fall  Precaution Comments Reviewed no pillow under knee  Restrictions  Weight Bearing Restrictions Yes  RLE Weight Bearing WBAT  Pain Assessment  Pain Assessment 0-10  Pain Score 2  Pain Location R knee  Pain Descriptors / Indicators Aching  Pain Intervention(s) Limited activity within patient's tolerance;Monitored during session;Repositioned  Cognition  Arousal/Alertness Awake/alert  Behavior During Therapy WFL for tasks assessed/performed  Overall Cognitive Status Within Functional Limits for tasks assessed  Bed Mobility  Overal bed mobility Needs Assistance  Bed Mobility Sit to Supine  Sit to supine Min assist  General bed mobility comments Completed simulated car transfer on elevated bed, providing pt w/ VCs on proper technique and assisting pt w/ managing RLE while scooting back in bed when bringing legs into bed.   Transfers  Overall transfer level Needs assistance  Equipment used Rolling walker (2 wheeled)  Transfers Sit to/from Stand  Sit to Stand Modified independent (Device/Increase time);From elevated surface  General transfer comment Practiced sit<>stand transfer x2 w/ pt and pt's wife from elevated bed to simulate car transfer, providing pt and pt's wife w/ VCs on proper technique.  Encouraged pt's wife to acquire  platform or step stool that pt can have when getting out of car, pt's wife verbalized she will do so.  Ambulation/Gait  Ambulation/Gait assistance Supervision  Ambulation Distance (Feet) 10 Feet  Assistive device Rolling walker (2 wheeled)  General Gait Details Supervision for safety.  Pt continues to WB through BUEs to offload RLE.   Gait Pattern/deviations Step-to pattern;Antalgic;Trunk flexed  Gait velocity interpretation Below normal speed for age/gender  Balance  Overall balance assessment Needs assistance  Sitting-balance support No upper extremity supported;Feet supported  Sitting balance-Leahy Scale Good  Standing balance support Bilateral upper extremity supported;During functional activity  Standing balance-Leahy Scale Fair  General Comments  General comments (skin integrity, edema, etc.) While educating pt on proper technique to get in/out of car pt's wife continuously going in and out of room to speak with her sister.  Stressed to wife the importance of her participating in this therapy session so pt can safey go in/out of car.  Pt's wife remained in room for education and pt and pt's wife verbalized understanding of proper technique for the car transfer and reported that they did not have any questions.  Per CM pt's wife is refusing HHPT upon return home as she "does not want others evaluating her home".  Explained to pt and pt's wife the importance of receiving additional therapy and benefit of having therapy at home to ensure progress for pt but pt's wife continues to refuse HHPT and insists that she will find her own outpatient PT for her husband to go to.  PT - End of Session  Equipment Utilized During  Treatment Gait belt  Activity Tolerance Patient tolerated treatment well  Patient left in chair;with call bell/phone within reach;with family/visitor present  Nurse Communication Mobility status  PT - Assessment/Plan  PT Plan Discharge plan needs to be updated  PT Frequency  (ACUTE ONLY) 7X/week  Follow Up Recommendations Home health PT;Supervision for mobility/OOB;Outpatient PT (Pt's wife refusing HHPT.  Pt's wife will set up OPPT per CM)  PT equipment None recommended by PT  PT Goal Progression  Progress towards PT goals Progressing toward goals  Acute Rehab PT Goals  PT Goal Formulation With patient/family  Time For Goal Achievement 05/04/15  Potential to Achieve Goals Good  PT General Charges  $$ ACUTE PT VISIT 1 Procedure  PT Treatments  $Therapeutic Activity 23-37 mins    Assessment: Pt and pt's wife request to review car transfer (see general notes above) and this was completed x2 w/ wife in room.  Pt able to safely complete simulated car transfer w/ min assist.  Pt and pt's wife verbalized understanding for proper technique and reported they did not have any questions at the end of the session.  Per CM pt's wife is refusing HHPT and pt's wife insists that she will set up pt's 80.  Pt will benefit from continued skilled PT services to increase functional independence and safety.   Joslyn Hy PT, DPT [phone] Pager: [phone]

## 2015-04-28 NOTE — Discharge Summary (Signed)
Patient ID: Kyle [name] MRN: [id number] DOB/AGE: September 23, 1946 69 y.o.  Admit date: 04/26/2015 Discharge date: 04/28/2015  Admission Diagnoses:  Principal Problem:   Primary osteoarthritis of right knee Active Problems:   Closed fracture of right patella   Arthritis of knee   Discharge Diagnoses:  Same  Past Medical History  Diagnosis Date  . Hypertension   . Tubular adenoma 2006  . Allergy   . Arthritis   . Asthma     pt. states very mild  . Coronary artery disease     non obstructive  . GERD (gastroesophageal reflux disease)     omeprazole PRN  . Weakness of right arm   . Insomnia     ambien  . Complication of anesthesia     pt. states that neck is arthritic and can not move neck very well.   . Difficult intubation   . Hypercholesterolemia   . Glucose intolerance (pre-diabetes) dx'd 03/2015  . DJD (degenerative joint disease)     "bad" (04/27/2015)  . Basal cell carcinoma     "shoulder"  . Squamous carcinoma     "left chest/abd"    Surgeries: Procedure(s): RIGHT TOTAL KNEE ARTHROPLASTY on 04/26/2015   Consultants:    Discharged Condition: Improved  Hospital Course: [name] is an 68 y.o. male who was admitted 04/26/2015 for operative treatment ofPrimary osteoarthritis of right knee. Patient has severe unremitting pain that affects sleep, daily activities, and work/hobbies. After pre-op clearance the patient was taken to the operating room on 04/26/2015 and underwent  Procedure(s): RIGHT TOTAL KNEE ARTHROPLASTY.    Patient was given perioperative antibiotics: Anti-infectives    Start     Dose/Rate Route Frequency Ordered Stop   04/26/15 0730  vancomycin (VANCOCIN) 1,500 mg in sodium chloride 0.9 % 500 mL IVPB     1,500 mg 250 mL/hr over 120 Minutes Intravenous On call to O.R. 04/26/15 0717 04/26/15 1046       Patient was given sequential compression devices, early ambulation, and chemoprophylaxis to prevent DVT.  Patient benefited maximally from hospital  stay and there were no complications.    Recent vital signs: Patient Vitals for the past 24 hrs:  BP Temp Temp src Pulse Resp SpO2 Height Weight  04/28/15 0554 104/66 mmHg 98.6 F (37 C) - 66 16 96 % - -  04/27/15 2141 118/64 mmHg 98.9 F (37.2 C) Oral 77 18 97 % - -  04/27/15 2110 (!) 110/52 mmHg - - 75 - - - -  04/27/15 1519 123/69 mmHg 99.4 F (37.4 C) Oral 72 18 95 % - -  04/27/15 1200 - - - - - - 6\' 5"  (1.956 m) 100.47 kg (221 lb 7.9 oz)     Recent laboratory studies:  Recent Labs  04/27/15 0824 04/28/15 0539  WBC 10.8* 10.2  HGB 12.0* 9.9*  HCT 34.2* 29.3*  PLT 305 272  NA 128*  --   K 3.9  --   CL 96*  --   CO2 28  --   BUN 8  --   CREATININE 0.81  --   GLUCOSE 189*  --   CALCIUM 8.0*  --      Discharge Medications:     Medication List    TAKE these medications        aspirin EC 325 MG tablet  Take 1 tablet (325 mg total) by mouth 2 (two) times daily.     atorvastatin 40 MG tablet  Commonly known as:  LIPITOR  Take 1 tablet (40 mg total) by mouth at bedtime.     Azelastine HCl 0.15 % Soln  Commonly known as:  ASTEPRO  Place 2 sprays into the nose daily as needed (allergies).     B COMPLEX PO  Take 1 tablet by mouth 3 (three) times a week.     Back Support L/XL Misc  1 Units by Does not apply route daily as needed.     cetirizine 10 MG tablet  Commonly known as:  ZYRTEC  Take 10 mg by mouth daily as needed for allergies.     clobetasol ointment 0.05 %  Commonly known as:  TEMOVATE  APPLY TOPICALLY TWICE A DAY AS NEEDED SKIN IRRITATIONS     diltiazem 240 MG 24 hr capsule  Commonly known as:  CARDIZEM CD  Take 2 capsules (480 mg total) by mouth daily.     EPINEPHrine 0.3 mg/0.3 mL Soaj injection  Commonly known as:  EPI-PEN  Inject 0.3 mLs (0.3 mg total) into the muscle as needed (for allergic reactions).     fluticasone 220 MCG/ACT inhaler  Commonly known as:  FLOVENT HFA  Inhale 1 puff into the lungs daily at 12 noon. Rinse mouth with  water after use.     fluticasone 50 MCG/ACT nasal spray  Commonly known as:  FLONASE  INSTILL 2 SPRAYS INTO NOSTRIL ONCE DAILY     HYDROcodone-acetaminophen 5-325 MG per tablet  Commonly known as:  NORCO/VICODIN  Take 1 tablet by mouth every 6 (six) hours as needed for moderate pain.     metFORMIN 500 MG 24 hr tablet  Commonly known as:  GLUCOPHAGE XR  Take 1 tablet (500 mg total) by mouth daily at 12 noon. Take 1 tablet daily     methocarbamol 750 MG tablet  Commonly known as:  ROBAXIN  Take 1 tablet (750 mg total) by mouth every 8 (eight) hours as needed for muscle spasms.     methocarbamol 500 MG tablet  Commonly known as:  ROBAXIN  Take 1 tablet (500 mg total) by mouth 2 (two) times daily with a meal.     metoprolol succinate 50 MG 24 hr tablet  Commonly known as:  TOPROL-XL  Take 1 tablet (50 mg total) by mouth at bedtime.     mometasone 220 MCG/INH inhaler  Commonly known as:  ASMANEX  Inhale 2 puffs into the lungs daily.     multivitamin Tabs tablet  Take 1 tablet by mouth 3 (three) times a week.     omeprazole 20 MG capsule  Commonly known as:  PRILOSEC  Take 1 capsule (20 mg total) by mouth daily as needed (HEARTBURN).     oxyCODONE-acetaminophen 5-325 MG per tablet  Commonly known as:  ROXICET  Take 1 tablet by mouth every 4 (four) hours as needed.     predniSONE 20 MG tablet  Commonly known as:  DELTASONE  Two daily with food     sildenafil 20 MG tablet  Commonly known as:  REVATIO  Take 4 tablets (80 mg total) by mouth as needed (1 hour prior to intercourse). Take 2 to 3 one hour prior to intercourse     VENTOLIN HFA 108 (90 BASE) MCG/ACT inhaler  Generic drug:  albuterol  Inhale 1-2 puffs into the lungs every 6 (six) hours as needed for wheezing or shortness of breath.     VIAGRA 100 MG tablet  Generic drug:  sildenafil  1 po daily for erectile dysfunction.  zolpidem 10 MG tablet  Commonly known as:  AMBIEN  TAKE 1 TABLET BY MOUTH AT BEDTIME         Diagnostic Studies: Dg Chest 2 View  04/14/2015   CLINICAL DATA:  Preop for knee surgery  EXAM: CHEST  2 VIEW  COMPARISON:  Chest x-ray of 05/24/2013  FINDINGS: The lungs remain clear and somewhat hyper aerated. With flattened hemidiaphragms emphysema is a consideration. No infiltrate or effusion is seen. Mediastinal and hilar contours are unremarkable. The heart is within normal limits in size. No acute bony abnormality is seen. Is there any clinical suspicion of ankylosing spondylitis?  IMPRESSION: 1. No active cardiopulmonary disease. 2. 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 3. Question ankylosing spondylitis.   Electronically Signed   By: Ivar Drape M.D.   On: 04/14/2015 15:08   Dg Knee 1-2 Views Left  04/20/2015   CLINICAL DATA:  Patellar fracture. Comparison contralateral extremity. RIGHT patellar fracture.  EXAM: LEFT KNEE - 1-2 VIEW  COMPARISON:  None.  FINDINGS: Mild patellofemoral osteoarthritis. The alignment of the LEFT knee is anatomic. No effusion. Medial and lateral joint spaces appear preserved. No destructive osseous lesions. Notably, there is no sunrise view submitted for interpretation. The patella appears intact on the LEFT side without ossification variant.  IMPRESSION: Mild patellofemoral osteoarthritis.   Electronically Signed   By: Dereck Ligas M.D.   On: 04/20/2015 15:38   Dg Knee Complete 4 Views Right  04/10/2015   CLINICAL DATA:  Diffuse right knee pain.  Knee pain and swelling.  EXAM: RIGHT KNEE - COMPLETE 4+ VIEW  COMPARISON:  None.  FINDINGS: There is a vertical fracture of the lateral aspect of the patella involving the articular surface of the lateral patellar facet. There is no other fracture or dislocation. There is moderate osteoarthritis of the patellofemoral compartment. There is moderate -severe osteoarthritis of the medial femorotibial compartment with severe joint space narrowing. There is generalized osteopenia. There is a large joint effusion.  IMPRESSION: Acute  nondisplaced vertical fracture of the lateral aspect of the patella involving the articular surface of the lateral patellar facet. Large joint effusion.   Electronically Signed   By: Kathreen Devoid   On: 04/10/2015 11:06    Disposition: 01-Home or Self Care      Discharge Instructions    CPM    Complete by:  As directed   Continuous passive motion machine (CPM):      Use the CPM from 0 to 60  for 5 hours per day.      You may increase by 10 degrees per day.  You may break it up into 2 or 3 sessions per day.      Use CPM for 2 weeks or until you are told to stop.     Call MD / Call 911    Complete by:  As directed   If you experience chest pain or shortness of breath, CALL 911 and be transported to the hospital emergency room.  If you develope a fever above 101 F, pus (white drainage) or increased drainage or redness at the wound, or calf pain, call your surgeon's office.     Change dressing    Complete by:  As directed   Change dressing on day 5, then change the dressing daily with sterile 4 x 4 inch gauze dressing and apply TED hose.  You may clean the incision with alcohol prior to redressing.     Constipation Prevention    Complete by:  As directed  Drink plenty of fluids.  Prune juice may be helpful.  You may use a stool softener, such as Colace (over the counter) 100 mg twice a day.  Use MiraLax (over the counter) for constipation as needed.     Diet - low sodium heart healthy    Complete by:  As directed      Discharge instructions    Complete by:  As directed   Follow up in office with Dr. Mayer Camel in 2 weeks.     Driving restrictions    Complete by:  As directed   No driving for 2 weeks     Increase activity slowly as tolerated    Complete by:  As directed      Patient may shower    Complete by:  As directed   You may shower without a dressing once there is no drainage.  Do not wash over the wound.  If drainage remains, cover wound with plastic wrap and then shower.            Follow-up Information    Follow up with Kerin Salen, MD In 2 weeks.   Specialty:  Orthopedic Surgery   Contact information:   [place] [address] [phone]        Signed: Theodosia Quay 04/28/2015, 7:52 AM

## 2015-04-29 ENCOUNTER — Other Ambulatory Visit (HOSPITAL_COMMUNITY): Payer: Medicare Other

## 2015-04-29 ENCOUNTER — Other Ambulatory Visit: Payer: Self-pay | Admitting: Emergency Medicine

## 2015-04-30 ENCOUNTER — Ambulatory Visit (HOSPITAL_COMMUNITY)
Admission: RE | Admit: 2015-04-30 | Discharge: 2015-04-30 | Disposition: A | Discharge status: Home / Self Care | Payer: 59 | Source: Ambulatory Visit | Attending: Emergency Medicine | Admitting: Emergency Medicine

## 2015-04-30 ENCOUNTER — Ambulatory Visit (INDEPENDENT_AMBULATORY_CARE_PROVIDER_SITE_OTHER): Payer: 59 | Admitting: Emergency Medicine

## 2015-04-30 ENCOUNTER — Other Ambulatory Visit: Payer: Self-pay | Admitting: *Deleted

## 2015-04-30 ENCOUNTER — Other Ambulatory Visit: Payer: Self-pay | Admitting: Emergency Medicine

## 2015-04-30 VITALS — BP 126/76 | HR 104 | Temp 99.3°F | Resp 17

## 2015-04-30 DIAGNOSIS — R509 Fever, unspecified: Secondary | ICD-10-CM | POA: Diagnosis not present

## 2015-04-30 DIAGNOSIS — M79661 Pain in right lower leg: Secondary | ICD-10-CM | POA: Insufficient documentation

## 2015-04-30 DIAGNOSIS — M79651 Pain in right thigh: Secondary | ICD-10-CM | POA: Insufficient documentation

## 2015-04-30 DIAGNOSIS — M7989 Other specified soft tissue disorders: Secondary | ICD-10-CM

## 2015-04-30 LAB — POCT URINALYSIS DIPSTICK
BILIRUBIN UA: NEGATIVE
GLUCOSE UA: NEGATIVE
KETONES UA: NEGATIVE
Leukocytes, UA: NEGATIVE
Nitrite, UA: NEGATIVE
Protein, UA: 30
Spec Grav, UA: 1.01
UROBILINOGEN UA: 2
pH, UA: 5.5

## 2015-04-30 LAB — POCT CBC
Granulocyte percent: 67.9 %G (ref 37–80)
HEMATOCRIT: 26.8 % — AB (ref 43.5–53.7)
Hemoglobin: 9.1 g/dL — AB (ref 14.1–18.1)
LYMPH, POC: 2 (ref 0.6–3.4)
MCH: 29.3 pg (ref 27–31.2)
MCHC: 33.8 g/dL (ref 31.8–35.4)
MCV: 86.7 fL (ref 80–97)
MID (cbc): 0.8 (ref 0–0.9)
MPV: 6.2 fL (ref 0–99.8)
POC Granulocyte: 5.8 (ref 2–6.9)
POC LYMPH PERCENT: 23.1 %L (ref 10–50)
POC MID %: 9 % (ref 0–12)
Platelet Count, POC: 353 10*3/uL (ref 142–424)
RBC: 3.1 M/uL — AB (ref 4.69–6.13)
RDW, POC: 13.4 %
WBC: 8.5 10*3/uL (ref 4.6–10.2)

## 2015-04-30 LAB — POCT UA - MICROSCOPIC ONLY
CASTS, UR, LPF, POC: NEGATIVE
Crystals, Ur, HPF, POC: NEGATIVE
MUCUS UA: POSITIVE
YEAST UA: NEGATIVE

## 2015-04-30 LAB — GLUCOSE, POCT (MANUAL RESULT ENTRY): POC Glucose: 120 mg/dl — AB (ref 70–99)

## 2015-04-30 NOTE — Progress Notes (Signed)
Preliminary results by tech - Right Lower Ext. Venous Duplex Completed. Negative for Deep and superficial vein thrombosis in the right lower extremity. Dr. Everlene Farrier was notified with results. Oda Cogan, BS, RDMS, RVT

## 2015-04-30 NOTE — Progress Notes (Addendum)
Subjective:  This chart was scribed for [name], MD by Leandra Kern, Medical Scribe. This patient was seen in Room 5 and the patient's care was started at 2:20 PM.   Patient ID: Kyle [name], male    DOB: 1945/11/26, 69 y.o.   MRN: [id number]  HPI HPI Comments: Kyle [name] is a 69 y.o. male with a PMHx of arthritis of the knee, closed fracture of the patella, and osteoarthritis of the the right knee, who presents to [facility] for a follow up regarding a right knee fracture secondary to a fall, onset three weeks ago. Pt's right leg is very swollen  In the above and below the knee area.  Pt presents with a fever. He reports that he has no respiratory distress. Patient had knee replacement on Monday. He has been seeing Dr. Mayer Camel. He was seen today and the knee aspirated and fluid sent for evaluation. He enters today because of progressive swelling involving his entire right leg.    Review of Systems  Constitutional: Positive for fever.  Respiratory: Negative for chest tightness and shortness of breath.   Cardiovascular: Positive for leg swelling (right leg). Negative for chest pain.      Objective:   Physical Exam  Constitutional: He is oriented to person, place, and time. He appears well-developed and well-nourished. No distress.  HENT:  Head: Normocephalic and atraumatic.  Eyes: EOM are normal. Pupils are equal, round, and reactive to light.  Neck: Neck supple.  Cardiovascular: Normal rate.   Pulmonary/Chest: Effort normal.  Musculoskeletal: He exhibits edema.  10 inches below the crease: Right leg- 60 cm, Left leg- 52 cm     Neurological: He is alert and oriented to person, place, and time. No cranial nerve deficit.  Skin: Skin is warm and dry.  Psychiatric: He has a normal mood and affect. His behavior is normal.  Nursing note and vitals reviewed.  Results for orders placed or performed in visit on 04/30/15  POCT urinalysis dipstick  Result Value  Ref Range   Color, UA AMBER    Clarity, UA CLEAR    Glucose, UA NEG    Bilirubin, UA NEG    Ketones, UA NEG    Spec Grav, UA 1.010    Blood, UA MODERATE    pH, UA 5.5    Protein, UA 30    Urobilinogen, UA 2.0    Nitrite, UA NEG    Leukocytes, UA Negative Negative  POCT UA - Microscopic Only  Result Value Ref Range   WBC, Ur, HPF, POC 0-1    RBC, urine, microscopic 3-9    Bacteria, U Microscopic TRACE    Mucus, UA POSITIVE    Epithelial cells, urine per micros 1-2    Crystals, Ur, HPF, POC NEG    Casts, Ur, LPF, POC NEG    Yeast, UA NEG   POCT CBC  Result Value Ref Range   WBC 8.5 4.6 - 10.2 K/uL   Lymph, poc 2.0 0.6 - 3.4   POC LYMPH PERCENT 23.1 10 - 50 %L   MID (cbc) 0.8 0 - 0.9   POC MID % 9.0 0 - 12 %M   POC Granulocyte 5.8 2 - 6.9   Granulocyte percent 67.9 37 - 80 %G   RBC 3.10 (A) 4.69 - 6.13 M/uL   Hemoglobin 9.1 (A) 14.1 - 18.1 g/dL   HCT, POC 26.8 (A) 43.5 - 53.7 %   MCV 86.7 80 -  97 fL   MCH, POC 29.3 27 - 31.2 pg   MCHC 33.8 31.8 - 35.4 g/dL   RDW, POC 13.4 %   Platelet Count, POC 353 142 - 424 K/uL   MPV 6.2 0 - 99.8 fL      Assessment & Plan:  Patient present follow up with right knee replacement. He has had a HB of 9.1 from discharge Hb of 12. I suspect that there is a significant amount of blood in the right thigh, right paplatel area, and right calf. His white count is nl which is reassuring. Pt is sent to doppler to rule out a clot. Dr. Mayer Camel is aware of the pt's condition. He does have hematuria , but no white cells in the urine. I personally performed the services described in this documentation, which was scribed in my presence. The recorded information has been reviewed and is accurate. Venous Doppler done and this was normal. Will follow-up over the weekend as needed.  Nena Jordan, MD

## 2015-05-02 ENCOUNTER — Telehealth: Payer: Self-pay | Admitting: Radiology

## 2015-05-02 ENCOUNTER — Ambulatory Visit (INDEPENDENT_AMBULATORY_CARE_PROVIDER_SITE_OTHER): Payer: 59 | Admitting: Emergency Medicine

## 2015-05-02 VITALS — BP 148/84 | HR 84 | Temp 99.4°F | Resp 18 | Wt 231.0 lb

## 2015-05-02 DIAGNOSIS — E871 Hypo-osmolality and hyponatremia: Secondary | ICD-10-CM | POA: Diagnosis not present

## 2015-05-02 DIAGNOSIS — M7989 Other specified soft tissue disorders: Secondary | ICD-10-CM | POA: Diagnosis not present

## 2015-05-02 DIAGNOSIS — R509 Fever, unspecified: Secondary | ICD-10-CM | POA: Diagnosis not present

## 2015-05-02 DIAGNOSIS — E119 Type 2 diabetes mellitus without complications: Secondary | ICD-10-CM | POA: Diagnosis not present

## 2015-05-02 DIAGNOSIS — D62 Acute posthemorrhagic anemia: Secondary | ICD-10-CM | POA: Diagnosis not present

## 2015-05-02 LAB — POCT CBC
Granulocyte percent: 60.4 %G (ref 37–80)
HCT, POC: 28.5 % — AB (ref 43.5–53.7)
Hemoglobin: 9.3 g/dL — AB (ref 14.1–18.1)
Lymph, poc: 2.1 (ref 0.6–3.4)
MCH: 28.7 pg (ref 27–31.2)
MCHC: 32.8 g/dL (ref 31.8–35.4)
MCV: 87.7 fL (ref 80–97)
MID (cbc): 0.8 (ref 0–0.9)
MPV: 5.6 fL (ref 0–99.8)
PLATELET COUNT, POC: 423 10*3/uL (ref 142–424)
POC Granulocyte: 4.5 (ref 2–6.9)
POC LYMPH PERCENT: 28.3 %L (ref 10–50)
POC MID %: 11.3 % (ref 0–12)
RBC: 3.25 M/uL — AB (ref 4.69–6.13)
RDW, POC: 13.6 %
WBC: 7.4 10*3/uL (ref 4.6–10.2)

## 2015-05-02 LAB — COMPREHENSIVE METABOLIC PANEL
ALK PHOS: 201 U/L — AB (ref 39–117)
ALT: 32 U/L (ref 0–53)
AST: 28 U/L (ref 0–37)
Albumin: 2.9 g/dL — ABNORMAL LOW (ref 3.5–5.2)
BUN: 11 mg/dL (ref 6–23)
CALCIUM: 8 mg/dL — AB (ref 8.4–10.5)
CO2: 25 mEq/L (ref 19–32)
Chloride: 97 mEq/L (ref 96–112)
Creat: 0.73 mg/dL (ref 0.50–1.35)
Glucose, Bld: 113 mg/dL — ABNORMAL HIGH (ref 70–99)
Potassium: 4.6 mEq/L (ref 3.5–5.3)
SODIUM: 132 meq/L — AB (ref 135–145)
Total Bilirubin: 1.2 mg/dL (ref 0.2–1.2)
Total Protein: 5 g/dL — ABNORMAL LOW (ref 6.0–8.3)

## 2015-05-02 LAB — SYNOVIAL CELL COUNT + DIFF, W/ CRYSTALS
Crystals, Fluid: NONE SEEN
Eosinophils-Synovial: 0 % (ref 0–1)
LYMPHOCYTES-SYNOVIAL FLD: 0 % (ref 0–20)
MONOCYTE/MACROPHAGE: 1 % — AB (ref 50–90)
Neutrophil, Synovial: 99 % — ABNORMAL HIGH (ref 0–25)
WBC, Synovial: 6420 cu mm — ABNORMAL HIGH (ref 0–200)

## 2015-05-02 LAB — C-REACTIVE PROTEIN: CRP: 6.7 mg/dL — AB (ref <0.60)

## 2015-05-02 LAB — URINE CULTURE
COLONY COUNT: NO GROWTH
Organism ID, Bacteria: NO GROWTH

## 2015-05-02 LAB — GLUCOSE, POCT (MANUAL RESULT ENTRY): POC Glucose: 132 mg/dl — AB (ref 70–99)

## 2015-05-02 LAB — POCT SEDIMENTATION RATE: POCT SED RATE: 85 mm/hr — AB (ref 0–22)

## 2015-05-02 NOTE — Telephone Encounter (Signed)
Took call report from lab on Synovial fluid report given to Dr Everlene Farrier

## 2015-05-02 NOTE — Progress Notes (Signed)
Subjective:  This chart was scribed for [name], MD by Thea Alken, ED Scribe. This patient was seen in room 5 and the patient's care was started at 8:16 AM  Patient ID: Kyle [name], male    DOB: 1946-10-12, 69 y.o.   MRN: [id number]  HPI  Chief Complaint  Patient presents with  . Knee Pain    surgery by Dr Rowan/ red swollen    M.HPI Comments: Kyle [name] is a 69 y.o. male who presents to the [facility] complaining of progressively worsening, right knee pain described as achiness and pressure. Pt had right total knee arthroplasty 6 days ago by Dr. Mayer Camel after being diagnosed with a closed fracture of the right patella due to a fall 3 weeks prior. He was seen here 48 hours ago due to swelling and redness of the right leg. At that time he had a normal blood count. He has continued to have swelling, redness and to run fever. He has not lost feeling his toes. He reports progressively worsens over the course of the day which resulted in a rough night last night due to pain.   Past Medical History  Diagnosis Date  . Hypertension   . Tubular adenoma 2006  . Allergy   . Arthritis   . Asthma     pt. states very mild  . Coronary artery disease     non obstructive  . GERD (gastroesophageal reflux disease)     omeprazole PRN  . Weakness of right arm   . Insomnia     ambien  . Complication of anesthesia     pt. states that neck is arthritic and can not move neck very well.   . Difficult intubation   . Hypercholesterolemia   . Glucose intolerance (pre-diabetes) dx'd 03/2015  . DJD (degenerative joint disease)     "bad" (04/27/2015)  . Basal cell carcinoma     "shoulder"  . Squamous carcinoma     "left chest/abd"   Past Surgical History  Procedure Laterality Date  . Total hip arthroplasty Left 2004  . Salivary gland surgery  1999    Dr Thornell Mule  . Left heart catheterization with coronary angiogram N/A 05/26/2013    Procedure: LEFT HEART CATHETERIZATION WITH  CORONARY ANGIOGRAM;  Surgeon: Burnell Blanks, MD;  Location: [facility] CATH LAB;  Service: Cardiovascular;  Laterality: N/A;  . Joint replacement    . Colonoscopy      pt. states that may have had a polyp  . Total knee arthroplasty Right 04/26/2015    Procedure: RIGHT TOTAL KNEE ARTHROPLASTY;  Surgeon: Frederik Pear, MD;  Location: [place];  Service: Orthopedics;  Laterality: Right;  RIGHT TOTAL KNEE ARTHROPLASTY  . Knee arthroscopy Right X 2  . Cardiac catheterization  2007; 2014  . Eye surgery    . Blepharoplasty Bilateral 2000's  . Basal cell carcinoma excision      "shoulder"  . Squamous cell carcinoma excision Left     "chest/abd"   Prior to Admission medications   Medication Sig Start Date End Date Taking? Authorizing Provider  aspirin EC 325 MG tablet Take 1 tablet (325 mg total) by mouth 2 (two) times daily. 04/26/15   Leighton Parody, PA-C  atorvastatin (LIPITOR) 40 MG tablet Take 1 tablet (40 mg total) by mouth at bedtime. 04/26/15   Leighton Parody, PA-C  Azelastine HCl (ASTEPRO) 0.15 % SOLN Place 2 sprays into the nose daily as needed (allergies). 04/26/15  Leighton Parody, PA-C  B Complex Vitamins (B COMPLEX PO) Take 1 tablet by mouth 3 (three) times a week.    Historical Provider, MD  cetirizine (ZYRTEC) 10 MG tablet Take 10 mg by mouth daily as needed for allergies.    Historical Provider, MD  clobetasol ointment (TEMOVATE) 0.05 % APPLY TOPICALLY TWICE A DAY AS NEEDED SKIN IRRITATIONS 04/26/15   Leighton Parody, PA-C  diltiazem (CARDIZEM CD) 240 MG 24 hr capsule Take 2 capsules (480 mg total) by mouth daily. 04/26/15   Leighton Parody, PA-C  Elastic Bandages & Supports (BACK SUPPORT L/XL) MISC 1 Units by Does not apply route daily as needed. 03/24/15   Jaynee Eagles, PA-C  EPINEPHrine 0.3 mg/0.3 mL IJ SOAJ injection Inject 0.3 mLs (0.3 mg total) into the muscle as needed (for allergic reactions). 09/14/14   Chelle Jeffery, PA-C  fluticasone (FLONASE) 50 MCG/ACT nasal spray INSTILL 2  SPRAYS INTO NOSTRIL ONCE DAILY 11/02/14   Darlyne Russian, MD  fluticasone (FLOVENT HFA) 220 MCG/ACT inhaler Inhale 1 puff into the lungs daily at 12 noon. Rinse mouth with water after use. 04/26/15   Leighton Parody, PA-C  HYDROcodone-acetaminophen (NORCO/VICODIN) 5-325 MG per tablet Take 1 tablet by mouth every 6 (six) hours as needed for moderate pain.    Historical Provider, MD  metFORMIN (GLUCOPHAGE XR) 500 MG 24 hr tablet Take 1 tablet (500 mg total) by mouth daily at 12 noon. Take 1 tablet daily 04/26/15   Leighton Parody, PA-C  methocarbamol (ROBAXIN) 500 MG tablet Take 1 tablet (500 mg total) by mouth 2 (two) times daily with a meal. 04/26/15   Leighton Parody, PA-C  methocarbamol (ROBAXIN) 750 MG tablet Take 1 tablet (750 mg total) by mouth every 8 (eight) hours as needed for muscle spasms. 03/25/15   [name], MD  metoprolol succinate (TOPROL-XL) 50 MG 24 hr tablet Take 1 tablet (50 mg total) by mouth at bedtime. 04/26/15   Leighton Parody, PA-C  mometasone [facility]) 220 MCG/INH inhaler Inhale 2 puffs into the lungs daily.    Historical Provider, MD  multivitamin (ONE-A-DAY MEN'S) TABS tablet Take 1 tablet by mouth 3 (three) times a week.    Historical Provider, MD  omeprazole (PRILOSEC) 20 MG capsule Take 1 capsule (20 mg total) by mouth daily as needed (HEARTBURN). 04/26/15   Leighton Parody, PA-C  oxyCODONE-acetaminophen (ROXICET) 5-325 MG per tablet Take 1 tablet by mouth every 4 (four) hours as needed. 04/26/15   Leighton Parody, PA-C  predniSONE (DELTASONE) 20 MG tablet Two daily with food 03/25/15   [name], MD  sildenafil (REVATIO) 20 MG tablet Take 4 tablets (80 mg total) by mouth as needed (1 hour prior to intercourse). Take 2 to 3 one hour prior to intercourse 04/26/15   Leighton Parody, PA-C  VENTOLIN HFA 108 (90 BASE) MCG/ACT inhaler Inhale 1-2 puffs into the lungs every 6 (six) hours as needed for wheezing or shortness of breath. 04/26/15   Leighton Parody, PA-C  zolpidem  (AMBIEN) 10 MG tablet TAKE 1 TABLET BY MOUTH AT BEDTIME 04/30/15   Darlyne Russian, MD   Review of Systems  Constitutional: Positive for fever.  Musculoskeletal: Positive for myalgias and arthralgias.  Skin: Positive for color change.   Objective:   Physical Exam CONSTITUTIONAL: Well developed/well nourished HEAD: Normocephalic/atraumatic EYES: EOMI/PERRL ENMT: Mucous membranes moist NECK: supple no meningeal signs SPINE/BACK:entire spine nontender CV: S1/S2 noted, no murmurs/rubs/gallops noted LUNGS: Lungs are  clear to auscultation bilaterally, no apparent distress ABDOMEN: soft, nontender, no rebound or guarding, bowel sounds noted throughout abdomen GU:no cva tenderness NEURO: Pt is awake/alert/appropriate, moves all extremitiesx4.  No facial droop.   EXTREMITIES: pulses normal/equal, full ROM Swelling-20.5in left leg 24.75 right leg. Left mid thigh is approximately 4 in small than right thigh. There is redness over medial portion of the right knee extending down right leg. Blister reaction to right leg. Significant swelling of the entire right leg. Cap refill <2 sec. He has sensation in all toes.  SKIN: warm, color normal PSYCH: no abnormalities of mood noted, alert and oriented to situation  Filed Vitals:   05/02/15 0808  BP: 148/84  Pulse: 84  Temp: 99.4 F (37.4 C)  Resp: 18  Weight: 231 lb (104.781 kg)   Results for orders placed or performed in visit on 05/02/15  POCT CBC  Result Value Ref Range   WBC 7.4 4.6 - 10.2 K/uL   Lymph, poc 2.1 0.6 - 3.4   POC LYMPH PERCENT 28.3 10 - 50 %L   MID (cbc) 0.8 0 - 0.9   POC MID % 11.3 0 - 12 %M   POC Granulocyte 4.5 2 - 6.9   Granulocyte percent 60.4 37 - 80 %G   RBC 3.25 (A) 4.69 - 6.13 M/uL   Hemoglobin 9.3 (A) 14.1 - 18.1 g/dL   HCT, POC 28.5 (A) 43.5 - 53.7 %   MCV 87.7 80 - 97 fL   MCH, POC 28.7 27 - 31.2 pg   MCHC 32.8 31.8 - 35.4 g/dL   RDW, POC 13.6 %   Platelet Count, POC 423 142 - 424 K/uL   MPV 5.6 0 - 99.8 fL    POCT glucose (manual entry)  Result Value Ref Range   POC Glucose 132 (A) 70 - 99 mg/dl    Assessment & Plan:   Patient stable today. We have decided not to put him on antibodies because of his marked Vicente Males biotics sensitivity. He will continue his current treatment program. Blood cultures to date are negative urine culture was negative leg is wrapped and will be elevated. Repeat aspiration of the knee joint was done and sent today.I personally performed the services described in this documentation, which was scribed in my presence. The recorded information has been reviewed and is accurate.  Nena Jordan, MD

## 2015-05-05 ENCOUNTER — Other Ambulatory Visit: Payer: Self-pay | Admitting: Emergency Medicine

## 2015-05-05 DIAGNOSIS — I8311 Varicose veins of right lower extremity with inflammation: Secondary | ICD-10-CM

## 2015-05-05 LAB — WOUND CULTURE: ORGANISM ID, BACTERIA: NO GROWTH

## 2015-05-06 LAB — CULTURE, BLOOD (SINGLE)
Organism ID, Bacteria: NO GROWTH
Organism ID, Bacteria: NO GROWTH

## 2015-05-18 ENCOUNTER — Other Ambulatory Visit: Payer: Self-pay

## 2015-05-18 DIAGNOSIS — M7989 Other specified soft tissue disorders: Secondary | ICD-10-CM

## 2015-05-31 ENCOUNTER — Other Ambulatory Visit: Payer: Self-pay | Admitting: Emergency Medicine

## 2015-06-01 ENCOUNTER — Other Ambulatory Visit: Payer: Self-pay | Admitting: *Deleted

## 2015-06-01 DIAGNOSIS — E119 Type 2 diabetes mellitus without complications: Secondary | ICD-10-CM

## 2015-06-01 DIAGNOSIS — M79606 Pain in leg, unspecified: Secondary | ICD-10-CM

## 2015-06-02 ENCOUNTER — Ambulatory Visit (INDEPENDENT_AMBULATORY_CARE_PROVIDER_SITE_OTHER): Payer: 59 | Admitting: Emergency Medicine

## 2015-06-02 DIAGNOSIS — E119 Type 2 diabetes mellitus without complications: Secondary | ICD-10-CM | POA: Diagnosis not present

## 2015-06-02 DIAGNOSIS — M79606 Pain in leg, unspecified: Secondary | ICD-10-CM | POA: Diagnosis not present

## 2015-06-02 LAB — POCT CBC
GRANULOCYTE PERCENT: 48.6 % (ref 37–80)
HEMATOCRIT: 36.1 % — AB (ref 43.5–53.7)
HEMOGLOBIN: 11.6 g/dL — AB (ref 14.1–18.1)
Lymph, poc: 2.5 (ref 0.6–3.4)
MCH, POC: 27.7 pg (ref 27–31.2)
MCHC: 32.2 g/dL (ref 31.8–35.4)
MCV: 86 fL (ref 80–97)
MID (CBC): 0.6 (ref 0–0.9)
MPV: 6.2 fL (ref 0–99.8)
POC Granulocyte: 2.9 (ref 2–6.9)
POC LYMPH PERCENT: 42.1 %L (ref 10–50)
POC MID %: 9.3 %M (ref 0–12)
Platelet Count, POC: 303 10*3/uL (ref 142–424)
RBC: 4.2 M/uL — AB (ref 4.69–6.13)
RDW, POC: 14.4 %
WBC: 6 10*3/uL (ref 4.6–10.2)

## 2015-06-02 LAB — POCT SEDIMENTATION RATE: POCT SED RATE: 20 mm/h (ref 0–22)

## 2015-06-02 LAB — BASIC METABOLIC PANEL
BUN: 11 mg/dL (ref 7–25)
CO2: 26 mmol/L (ref 20–31)
Calcium: 9.1 mg/dL (ref 8.6–10.3)
Chloride: 98 mmol/L (ref 98–110)
Creat: 0.75 mg/dL (ref 0.70–1.25)
GLUCOSE: 113 mg/dL — AB (ref 65–99)
Potassium: 4.6 mmol/L (ref 3.5–5.3)
Sodium: 132 mmol/L — ABNORMAL LOW (ref 135–146)

## 2015-06-02 LAB — GLUCOSE, POCT (MANUAL RESULT ENTRY): POC GLUCOSE: 125 mg/dL — AB (ref 70–99)

## 2015-06-02 LAB — POCT GLYCOSYLATED HEMOGLOBIN (HGB A1C): Hemoglobin A1C: 5.8

## 2015-06-02 NOTE — Progress Notes (Addendum)
Patient ID: Kyle [name], male   DOB: 10/27/1946, 69 y.o.   MRN: [id number]    This chart was scribed for Nena Jordan, MD by [facility], medical scribe at [facility].The patient was seen in exam room 10 and the patient's care was started at 8:12 AM.  Chief Complaint:  Chief Complaint  Patient presents with  . Hypertension  . Hyperglycemia  . Anemia  . medication follow up   HPI: Kyle [name] is a 69 y.o. male who reports to [facility] today complaining of follow up regarding a post op right knee surgery. Today right leg is improving, less swelling, and warmth but still present. Swelling and warmth is most notable at the end of the day, but he is coming along well. Difficulty with bearing weight. Improving in physical therapy. He is also concerned about his blood sugar, because of lack of activity.  No chest pain, abdominal pain.  Past Medical History  Diagnosis Date  . Hypertension   . Tubular adenoma 2006  . Allergy   . Arthritis   . Asthma     pt. states very mild  . Coronary artery disease     non obstructive  . GERD (gastroesophageal reflux disease)     omeprazole PRN  . Weakness of right arm   . Insomnia     ambien  . Complication of anesthesia     pt. states that neck is arthritic and can not move neck very well.   . Difficult intubation   . Hypercholesterolemia   . Glucose intolerance (pre-diabetes) dx'd 03/2015  . DJD (degenerative joint disease)     "bad" (04/27/2015)  . Basal cell carcinoma     "shoulder"  . Squamous carcinoma     "left chest/abd"   Past Surgical History  Procedure Laterality Date  . Total hip arthroplasty Left 2004  . Salivary gland surgery  1999    Dr Thornell Mule  . Left heart catheterization with coronary angiogram N/A 05/26/2013    Procedure: LEFT HEART CATHETERIZATION WITH CORONARY ANGIOGRAM;  Surgeon: Burnell Blanks, MD;  Location: [facility] CATH LAB;  Service: Cardiovascular;  Laterality: N/A;  . Joint replacement    .  Colonoscopy      pt. states that may have had a polyp  . Total knee arthroplasty Right 04/26/2015    Procedure: RIGHT TOTAL KNEE ARTHROPLASTY;  Surgeon: Frederik Pear, MD;  Location: [place];  Service: Orthopedics;  Laterality: Right;  RIGHT TOTAL KNEE ARTHROPLASTY  . Knee arthroscopy Right X 2  . Cardiac catheterization  2007; 2014  . Eye surgery    . Blepharoplasty Bilateral 2000's  . Basal cell carcinoma excision      "shoulder"  . Squamous cell carcinoma excision Left     "chest/abd"   History   Social History  . Marital Status: Married    Spouse Name: N/A  . Number of Children: N/A  . Years of Education: N/A   Social History Main Topics  . Smoking status: Never Smoker   . Smokeless tobacco: Former Systems developer    Types: Chew     Comment: "chewed in my baseball years"  . Alcohol Use: 1.8 oz/week    1 Glasses of wine, 1 Cans of beer, 1 Shots of liquor per week  . Drug Use: No  . Sexual Activity: Yes   Other Topics Concern  . None   Social History Narrative   Family History  Problem Relation Age of Onset  .  Coronary artery disease    . Heart disease Mother   . Heart disease Father   . Coronary artery disease Sister    Allergies  Allergen Reactions  . Ceftin [Cefuroxime Axetil] Anaphylaxis  . Cefuroxime Axetil Anaphylaxis and Hives  . Celecoxib Anaphylaxis and Hives  . Nsaids Anaphylaxis and Hives    Pt takes aspirin every other day  . Other     Hand sanitizers- angioedema can tolerate CHG, alcohol prep, and betadine   . Penicillins Hives  . Sulfa Antibiotics Hives  . Adhesive [Tape] Rash  . Clindamycin/Lincomycin Rash  . Doxycycline Rash   Prior to Admission medications   Medication Sig Start Date End Date Taking? Authorizing Provider  aspirin EC 325 MG tablet Take 1 tablet (325 mg total) by mouth 2 (two) times daily. 04/26/15  Yes Leighton Parody, PA-C  atorvastatin (LIPITOR) 40 MG tablet Take 1 tablet (40 mg total) by mouth at bedtime. 04/26/15  Yes Leighton Parody,  PA-C  B Complex Vitamins (B COMPLEX PO) Take 1 tablet by mouth 3 (three) times a week.   Yes Historical Provider, MD  cetirizine (ZYRTEC) 10 MG tablet Take 10 mg by mouth daily as needed for allergies.   Yes Historical Provider, MD  clobetasol ointment (TEMOVATE) 0.05 % APPLY TOPICALLY TWICE A DAY AS NEEDED SKIN IRRITATIONS 04/26/15  Yes Leighton Parody, PA-C  diltiazem (CARDIZEM CD) 240 MG 24 hr capsule Take 2 capsules (480 mg total) by mouth daily. 04/26/15  Yes Leighton Parody, PA-C  EPINEPHrine 0.3 mg/0.3 mL IJ SOAJ injection Inject 0.3 mLs (0.3 mg total) into the muscle as needed (for allergic reactions). 09/14/14  Yes Chelle Jeffery, PA-C  fluticasone (FLONASE) 50 MCG/ACT nasal spray INSTILL 2 SPRAYS INTO NOSTRIL ONCE DAILY 11/02/14  Yes Darlyne Russian, MD  fluticasone (FLOVENT HFA) 220 MCG/ACT inhaler Inhale 1 puff into the lungs daily at 12 noon. Rinse mouth with water after use. 04/26/15  Yes Leighton Parody, PA-C  HYDROcodone-acetaminophen (NORCO/VICODIN) 5-325 MG per tablet Take 1 tablet by mouth every 6 (six) hours as needed for moderate pain.   Yes Historical Provider, MD  metFORMIN (GLUCOPHAGE XR) 500 MG 24 hr tablet Take 1 tablet (500 mg total) by mouth daily at 12 noon. Take 1 tablet daily 04/26/15  Yes Leighton Parody, PA-C  methocarbamol (ROBAXIN) 750 MG tablet Take 1 tablet (750 mg total) by mouth every 8 (eight) hours as needed for muscle spasms. 03/25/15  Yes [name], MD  metoprolol succinate (TOPROL-XL) 50 MG 24 hr tablet Take 1 tablet (50 mg total) by mouth at bedtime. 04/26/15  Yes Leighton Parody, PA-C  multivitamin (ONE-A-DAY MEN'S) TABS tablet Take 1 tablet by mouth 3 (three) times a week.   Yes Historical Provider, MD  omeprazole (PRILOSEC) 20 MG capsule Take 1 capsule (20 mg total) by mouth daily as needed (HEARTBURN). 04/26/15  Yes Leighton Parody, PA-C  oxyCODONE-acetaminophen (ROXICET) 5-325 MG per tablet Take 1 tablet by mouth every 4 (four) hours as needed. 04/26/15  Yes  Leighton Parody, PA-C  sildenafil (REVATIO) 20 MG tablet Take 4 tablets (80 mg total) by mouth as needed (1 hour prior to intercourse). Take 2 to 3 one hour prior to intercourse 04/26/15  Yes Neldon Newport  VENTOLIN HFA 108 (90 BASE) MCG/ACT inhaler Inhale 1-2 puffs into the lungs every 6 (six) hours as needed for wheezing or shortness of breath. 04/26/15  Yes Leighton Parody, PA-C  zolpidem (  AMBIEN) 10 MG tablet TAKE 1 TABLET BY MOUTH AT BEDTIME 04/30/15  Yes Darlyne Russian, MD  Azelastine HCl (ASTEPRO) 0.15 % SOLN Place 2 sprays into the nose daily as needed (allergies). 04/26/15   Leighton Parody, PA-C  Elastic Bandages & Supports (BACK SUPPORT L/XL) MISC 1 Units by Does not apply route daily as needed. Patient not taking: Reported on 06/02/2015 03/24/15   Jaynee Eagles, PA-C  predniSONE (DELTASONE) 20 MG tablet Two daily with food Patient not taking: Reported on 06/02/2015 03/25/15   [name], MD   ROS: The patient denies fevers, chills, night sweats, unintentional weight loss, chest pain, palpitations, wheezing, dyspnea on exertion, nausea, vomiting, abdominal pain, dysuria, hematuria, melena, numbness, weakness, or tingling.   All other systems have been reviewed and were otherwise negative with the exception of those mentioned in the HPI and as above.    PHYSICAL EXAM: Filed Vitals:   06/02/15 0802  BP: 128/82  Pulse: 79  Temp: 98.2 F (36.8 C)  Resp: 18   Body mass index is 25.73 kg/(m^2).  General: Alert, no acute distress HEENT:  Normocephalic, atraumatic, oropharynx patent. Eye: Juliette Mangle [facility] Cardiovascular:  Regular rate and rhythm, no rubs murmurs or gallops.  No Carotid bruits, radial pulse intact. No pedal edema.  Respiratory: Clear to auscultation bilaterally.  No wheezes, rales, or rhonchi.  No cyanosis, no use of accessory musculature Abdominal: No organomegaly, abdomen is soft and non-tender, positive bowel sounds.  No masses. Musculoskeletal: Gait intact. No edema,  tenderness, right knee is warm swollen, lax approximately 15 degrees of full extension. persistent redness of the calf with varicose veins. Skin: No rashes. Neurologic: Facial musculature symmetric. Psychiatric: Patient acts appropriately throughout our interaction.  LABS: Results for orders placed or performed in visit on 05/02/15  Wound culture  Result Value Ref Range   Gram Stain Rare    Gram Stain WBC present-both PMN and Mononuclear    Gram Stain      No Organisms Seen Critical Results Called to,Read Back By and Verified With:   Gram Stain DR.DAUB ON 05/02/15 @1225  BY KENDR    Organism ID, Bacteria NO GROWTH 2 DAYS   C-reactive protein  Result Value Ref Range   CRP 6.7 (H) <0.60 mg/dL  Comprehensive metabolic panel  Result Value Ref Range   Sodium 132 (L) 135 - 145 mEq/L   Potassium 4.6 3.5 - 5.3 mEq/L   Chloride 97 96 - 112 mEq/L   CO2 25 19 - 32 mEq/L   Glucose, Bld 113 (H) 70 - 99 mg/dL   BUN 11 6 - 23 mg/dL   Creat 0.73 0.50 - 1.35 mg/dL   Total Bilirubin 1.2 0.2 - 1.2 mg/dL   Alkaline Phosphatase 201 (H) 39 - 117 U/L   AST 28 0 - 37 U/L   ALT 32 0 - 53 U/L   Total Protein 5.0 (L) 6.0 - 8.3 g/dL   Albumin 2.9 (L) 3.5 - 5.2 g/dL   Calcium 8.0 (L) 8.4 - 10.5 mg/dL  Synovial cell count + diff, w/ crystals  Result Value Ref Range   Color, Synovial RED (A) YELLOW   Appearance-Synovial TURBID (A) CLEAR   WBC, Synovial 6420 (H) 0 - 200 cu mm   Neutrophil, Synovial 99 (H) 0 - 25 %   Lymphocytes-Synovial Fld 0 0 - 20 %   Monocyte/Macrophage 1 (L) 50 - 90 %   Eosinophils-Synovial 0 0 - 1 %   Crystals, Fluid No crystals seen  POCT CBC  Result Value Ref Range   WBC 7.4 4.6 - 10.2 K/uL   Lymph, poc 2.1 0.6 - 3.4   POC LYMPH PERCENT 28.3 10 - 50 %L   MID (cbc) 0.8 0 - 0.9   POC MID % 11.3 0 - 12 %M   POC Granulocyte 4.5 2 - 6.9   Granulocyte percent 60.4 37 - 80 %G   RBC 3.25 (A) 4.69 - 6.13 M/uL   Hemoglobin 9.3 (A) 14.1 - 18.1 g/dL   HCT, POC 28.5 (A) 43.5 - 53.7 %     MCV 87.7 80 - 97 fL   MCH, POC 28.7 27 - 31.2 pg   MCHC 32.8 31.8 - 35.4 g/dL   RDW, POC 13.6 %   Platelet Count, POC 423 142 - 424 K/uL   MPV 5.6 0 - 99.8 fL  POCT glucose (manual entry)  Result Value Ref Range   POC Glucose 132 (A) 70 - 99 mg/dl  POCT SEDIMENTATION RATE  Result Value Ref Range   POCT SED RATE 85 (A) 0 - 22 mm/hr    Results for orders placed or performed in visit on 06/02/15  POCT CBC  Result Value Ref Range   WBC 6.0 4.6 - 10.2 K/uL   Lymph, poc 2.5 0.6 - 3.4   POC LYMPH PERCENT 42.1 10 - 50 %L   MID (cbc) 0.6 0 - 0.9   POC MID % 9.3 0 - 12 %M   POC Granulocyte 2.9 2 - 6.9   Granulocyte percent 48.6 37 - 80 %G   RBC 4.20 (A) 4.69 - 6.13 M/uL   Hemoglobin 11.6 (A) 14.1 - 18.1 g/dL   HCT, POC 36.1 (A) 43.5 - 53.7 %   MCV 86.0 80 - 97 fL   MCH, POC 27.7 27 - 31.2 pg   MCHC 32.2 31.8 - 35.4 g/dL   RDW, POC 14.4 %   Platelet Count, POC 303 142 - 424 K/uL   MPV 6.2 0 - 99.8 fL  POCT glucose (manual entry)  Result Value Ref Range   POC Glucose 125 (A) 70 - 99 mg/dl  POCT glycosylated hemoglobin (Hb A1C)  Result Value Ref Range   Hemoglobin A1C 5.8      ASSESSMENT/PLAN: Gross sideeffects, risk and benefits, and alternatives of medications d/w patient. Patient is aware that all medications have potential sideeffects and we are unable to predict every sideeffect or drug-drug interaction that may occur. Patient is doing well. Hemoglobin is a. Sugars under great control. He is slowly progressing with his knee rehabilitation.I personally performed the services described in this documentation, which was scribed in my presence. The recorded information has been reviewed and is accurate.  Nena Jordan, MD    [name] MD 06/02/2015 8:12 AM

## 2015-06-02 NOTE — Addendum Note (Signed)
Addended by: Kem Boroughs D on: 06/02/2015 09:08 AM   Modules accepted: Orders

## 2015-06-10 ENCOUNTER — Encounter: Payer: Medicare Other | Admitting: Vascular Surgery

## 2015-06-10 ENCOUNTER — Encounter (HOSPITAL_COMMUNITY): Payer: Medicare Other

## 2015-06-23 ENCOUNTER — Other Ambulatory Visit: Payer: Self-pay

## 2015-06-23 MED ORDER — EPINEPHRINE 0.3 MG/0.3ML IJ SOAJ: 0.3000 mg | INTRAMUSCULAR | Status: DC | PRN

## 2015-06-25 ENCOUNTER — Other Ambulatory Visit: Payer: Self-pay

## 2015-06-25 MED ORDER — EPINEPHRINE 0.3 MG/0.3ML IJ SOAJ: 0.3000 mg | INTRAMUSCULAR | Status: DC | PRN

## 2015-07-08 ENCOUNTER — Other Ambulatory Visit: Payer: Self-pay | Admitting: Emergency Medicine

## 2015-07-18 ENCOUNTER — Other Ambulatory Visit: Payer: Self-pay | Admitting: Physician Assistant

## 2015-07-18 DIAGNOSIS — Z23 Encounter for immunization: Secondary | ICD-10-CM

## 2015-07-19 ENCOUNTER — Other Ambulatory Visit: Payer: Self-pay | Admitting: Emergency Medicine

## 2015-07-25 ENCOUNTER — Ambulatory Visit (INDEPENDENT_AMBULATORY_CARE_PROVIDER_SITE_OTHER): Payer: 59 | Admitting: Physician Assistant

## 2015-07-25 DIAGNOSIS — H6123 Impacted cerumen, bilateral: Secondary | ICD-10-CM

## 2015-07-25 NOTE — Progress Notes (Signed)
Following verbal consent patients right ear was flushed with peroxide and warm water at 1:1 ratio.  Patient experienced discomfort and dizziness with removal of large amount of cerumen. Patient was placed in supine position for several minutes and dizziness and pain resolved quickly. Patients left ear was then flushed with peroxide and warm water 1:1 ratio, patient tolerated well with no discomfort. Patients ear canal on the right did appear very red, Bennett Scrape PA-C was advised and did return to recheck patient prior to discharge.

## 2015-07-25 NOTE — Progress Notes (Signed)
[facility] [address], [place] [address] [phone]  Date:  07/25/2015   Name:  Kyle [name]   DOB:  1946/07/15   MRN:  [id number]  PCP:  Jenny Reichmann, MD    Chief Complaint: Ear Fullness   History of Present Illness:  This is a 69 y.o. male who is presenting with bilateral clogged ears. He was in the pool yesterday and when he got out his left ear felt completely blocked. This is similar to times before when he has had cerumen impaction. He started the left ear also feels clogged but not as bad as the right ear. He denies pain or uri sx.  Review of Systems:  Review of Systems See HPI  Patient Active Problem List   Diagnosis Date Noted  . Arthritis of knee 04/26/2015  . Primary osteoarthritis of right knee 04/25/2015  . Closed fracture of right patella 04/25/2015  . 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 Posterior tibial tendonitis 08/07/2013  . Precordial pain 05/26/2013  . Essential hypertension 10/19/2009  . Diabetes type 2, controlled 10/15/2009  . Hyperlipemia 10/15/2009  . CAD, NATIVE VESSEL 10/15/2009    Prior to Admission medications   Medication Sig Start Date End Date Taking? Authorizing Mckay Brandt  aspirin EC 325 MG tablet Take 1 tablet (325 mg total) by mouth 2 (two) times daily. 04/26/15  Yes Leighton Parody, PA-C  atorvastatin (LIPITOR) 40 MG tablet Take 1 tablet (40 mg total) by mouth at bedtime. 04/26/15  Yes Leighton Parody, PA-C  atorvastatin (LIPITOR) 40 MG tablet TAKE 1 TABLET BY MOUTH DAILY FOR CHOLESTEROL 07/09/15  Yes Darlyne Russian, MD  Azelastine HCl (ASTEPRO) 0.15 % SOLN Place 2 sprays into the nose daily as needed (allergies). 04/26/15  Yes Leighton Parody, PA-C  B Complex Vitamins (B COMPLEX PO) Take 1 tablet by mouth 3 (three) times a week.   Yes Historical Karmine Kauer, MD  cetirizine (ZYRTEC) 10 MG tablet Take 10 mg by mouth daily as needed for allergies.   Yes Historical Najeh Credit, MD  clobetasol ointment (TEMOVATE) 0.05 % APPLY  TOPICALLY TWICE A DAY AS NEEDED SKIN IRRITATIONS 04/26/15  Yes Leighton Parody, PA-C  diltiazem (CARDIZEM CD) 240 MG 24 hr capsule Take 2 capsules (480 mg total) by mouth daily. 04/26/15  Yes Leighton Parody, PA-C  Elastic Bandages & Supports (BACK SUPPORT L/XL) MISC 1 Units by Does not apply route daily as needed. 03/24/15  Yes Jaynee Eagles, PA-C  EPINEPHrine 0.3 mg/0.3 mL IJ SOAJ injection Inject 0.3 mLs (0.3 mg total) into the muscle as needed (for allergic reactions). 06/25/15  Yes Darlyne Russian, MD  fluticasone (FLONASE) 50 MCG/ACT nasal spray INSTILL 2 SPRAYS INTO NOSTRIL ONCE DAILY 11/02/14  Yes Darlyne Russian, MD  HYDROcodone-acetaminophen (NORCO/VICODIN) 5-325 MG per tablet Take 1 tablet by mouth every 6 (six) hours as needed for moderate pain.   Yes Historical Calin Fantroy, MD  metFORMIN (GLUCOPHAGE XR) 500 MG 24 hr tablet Take 1 tablet (500 mg total) by mouth daily at 12 noon. Take 1 tablet daily 04/26/15  Yes Leighton Parody, PA-C  methocarbamol (ROBAXIN) 750 MG tablet Take 1 tablet (750 mg total) by mouth every 8 (eight) hours as needed for muscle spasms. 03/25/15  Yes [name], MD  metoprolol succinate (TOPROL-XL) 50 MG 24 hr tablet Take 1 tablet (50 mg total) by mouth at bedtime. 04/26/15  Yes Leighton Parody, PA-C  multivitamin (ONE-A-DAY MEN'S) TABS tablet Take  1 tablet by mouth 3 (three) times a week.   Yes Historical Anaika Santillano, MD  omeprazole (PRILOSEC) 20 MG capsule Take 1 capsule (20 mg total) by mouth daily as needed (HEARTBURN). 04/26/15  Yes Leighton Parody, PA-C  sildenafil (REVATIO) 20 MG tablet TAKE 1 TABLET BY MOUTH EVERY 4 HOURS *5 DOES PER DAY* 07/20/15  Yes Chelle Jeffery, PA-C  VENTOLIN HFA 108 (90 BASE) MCG/ACT inhaler Inhale 1-2 puffs into the lungs every 6 (six) hours as needed for wheezing or shortness of breath. 04/26/15  Yes Leighton Parody, PA-C  zolpidem (AMBIEN) 10 MG tablet TAKE 1 TABLET BY MOUTH AT BEDTIME 04/30/15  Yes Darlyne Russian, MD    Allergies  Allergen Reactions   . Ceftin [Cefuroxime Axetil] Anaphylaxis  . Cefuroxime Axetil Anaphylaxis and Hives  . Celecoxib Anaphylaxis and Hives  . Nsaids Anaphylaxis and Hives    Pt takes aspirin every other day  . Other     Hand sanitizers- angioedema can tolerate CHG, alcohol prep, and betadine   . Penicillins Hives  . Sulfa Antibiotics Hives  . Adhesive [Tape] Rash  . Clindamycin/Lincomycin Rash  . Doxycycline Rash    Past Surgical History  Procedure Laterality Date  . Total hip arthroplasty Left 2004  . Salivary gland surgery  1999    Dr Thornell Mule  . Left heart catheterization with coronary angiogram N/A 05/26/2013    Procedure: LEFT HEART CATHETERIZATION WITH CORONARY ANGIOGRAM;  Surgeon: Burnell Blanks, MD;  Location: [facility] CATH LAB;  Service: Cardiovascular;  Laterality: N/A;  . Joint replacement    . Colonoscopy      pt. states that may have had a polyp  . Total knee arthroplasty Right 04/26/2015    Procedure: RIGHT TOTAL KNEE ARTHROPLASTY;  Surgeon: Frederik Pear, MD;  Location: [facility];  Service: Orthopedics;  Laterality: Right;  RIGHT TOTAL KNEE ARTHROPLASTY  . Knee arthroscopy Right X 2  . Cardiac catheterization  2007; 2014  . Eye surgery    . Blepharoplasty Bilateral 2000's  . Basal cell carcinoma excision      "shoulder"  . Squamous cell carcinoma excision Left     "chest/abd"    Social History  Substance Use Topics  . Smoking status: Never Smoker   . Smokeless tobacco: Former Systems developer    Types: Chew     Comment: "chewed in my baseball years"  . Alcohol Use: 1.8 oz/week    1 Glasses of wine, 1 Cans of beer, 1 Shots of liquor per week    Family History  Problem Relation Age of Onset  . Coronary artery disease    . Heart disease Mother   . Heart disease Father   . Coronary artery disease Sister     Medication list has been reviewed and updated.  Physical Examination:  Physical Exam  Constitutional: He is oriented to person, place, and time. He appears well-developed and  well-nourished. No distress.  HENT:  Head: Normocephalic and atraumatic.  Right Ear: Hearing normal.  Left Ear: Hearing normal.  Nose: Nose normal.  Bilateral canals completely obstructed with cerumen. After lavage sx improved greatly and bilateral TMs visualized. Pt did have pain with lavage of the right ear. Right TM with superior erythema.  Left TM clear.  Eyes: Conjunctivae and lids are normal. Right eye exhibits no discharge. Left eye exhibits no discharge. No scleral icterus.  Pulmonary/Chest: Effort normal. No respiratory distress.  Musculoskeletal: Normal range of motion.  Neurological: He is alert and oriented to person,  place, and time.  Skin: Skin is warm, dry and intact. No lesion and no rash noted.  Psychiatric: He has a normal mood and affect. His speech is normal and behavior is normal. Thought content normal.    There were no vitals taken for this visit.  Assessment and Plan:  1. Cerumen impaction, bilateral Sx improved after ear lavage. Pt did have pain with lavage of right ear and TM erythematous superiorly - likely inflamed from adherent cerumen. No pain currently. Will recheck ear later today to make sure erythema is resolving and not an early OM.   Benjaman Pott Drenda Freeze, MHS [facility]  07/25/2015

## 2015-08-03 ENCOUNTER — Encounter: Payer: Self-pay | Admitting: Emergency Medicine

## 2015-08-13 ENCOUNTER — Other Ambulatory Visit: Payer: Self-pay | Admitting: Family Medicine

## 2015-08-13 ENCOUNTER — Telehealth: Payer: Self-pay | Admitting: Radiology

## 2015-08-13 MED ORDER — ZOLPIDEM TARTRATE 10 MG PO TABS: 10.0000 mg | ORAL_TABLET | Freq: Every day | ORAL | Status: DC

## 2015-08-13 NOTE — Telephone Encounter (Signed)
Spoke with Gerald Stabs to inform that his Rx for Lorrin Mais is ready for p/u.

## 2015-08-26 NOTE — Addendum Note (Signed)
Addended by: Sinda Du on: 08/26/2015 05:49 PM   Modules accepted: Orders

## 2015-09-01 ENCOUNTER — Telehealth: Payer: Self-pay

## 2015-09-01 NOTE — Telephone Encounter (Signed)
[employer] faxed a request for records. I have sent the information they requested to fax # [phone].

## 2015-09-06 ENCOUNTER — Other Ambulatory Visit (INDEPENDENT_AMBULATORY_CARE_PROVIDER_SITE_OTHER): Payer: 59 | Admitting: Family Medicine

## 2015-09-06 ENCOUNTER — Other Ambulatory Visit: Payer: Self-pay | Admitting: Emergency Medicine

## 2015-09-06 DIAGNOSIS — I25119 Atherosclerotic heart disease of native coronary artery with unspecified angina pectoris: Secondary | ICD-10-CM

## 2015-09-06 DIAGNOSIS — E785 Hyperlipidemia, unspecified: Secondary | ICD-10-CM

## 2015-09-06 DIAGNOSIS — E119 Type 2 diabetes mellitus without complications: Secondary | ICD-10-CM

## 2015-09-06 DIAGNOSIS — D62 Acute posthemorrhagic anemia: Secondary | ICD-10-CM

## 2015-09-06 LAB — LIPID PANEL
CHOLESTEROL: 121 mg/dL — AB (ref 125–200)
HDL: 55 mg/dL (ref >=40)
LDL Cholesterol: 55 mg/dL (ref <130)
Total CHOL/HDL Ratio: 2.2 Ratio (ref <=5.0)
Triglycerides: 56 mg/dL (ref <150)
VLDL: 11 mg/dL (ref <30)

## 2015-09-06 LAB — COMPLETE METABOLIC PANEL WITH GFR
ALT: 18 U/L (ref 9–46)
AST: 19 U/L (ref 10–35)
Albumin: 3.6 g/dL (ref 3.6–5.1)
Alkaline Phosphatase: 98 U/L (ref 40–115)
BUN: 14 mg/dL (ref 7–25)
CHLORIDE: 97 mmol/L — AB (ref 98–110)
CO2: 29 mmol/L (ref 20–31)
Calcium: 8.7 mg/dL (ref 8.6–10.3)
Creat: 0.78 mg/dL (ref 0.70–1.25)
GFR, Est African American: >89 mL/min (ref >=60)
GFR, Est Non African American: >89 mL/min (ref >=60)
Glucose, Bld: 117 mg/dL — ABNORMAL HIGH (ref 65–99)
POTASSIUM: 4.8 mmol/L (ref 3.5–5.3)
SODIUM: 135 mmol/L (ref 135–146)
Total Bilirubin: 0.6 mg/dL (ref 0.2–1.2)
Total Protein: 6.2 g/dL (ref 6.1–8.1)

## 2015-09-06 LAB — HEMOGLOBIN A1C: HEMOGLOBIN A1C: 6.5 % — AB (ref 4.0–6.0)

## 2015-09-06 LAB — MICROALBUMIN, URINE: MICROALB UR: 0.4 mg/dL

## 2015-09-06 LAB — POCT GLYCOSYLATED HEMOGLOBIN (HGB A1C): HEMOGLOBIN A1C: 6.5

## 2015-09-06 LAB — GLUCOSE, POCT (MANUAL RESULT ENTRY): POC Glucose: 126 mg/dl — AB (ref 70–99)

## 2015-09-06 MED ORDER — METFORMIN HCL ER 500 MG PO TB24: ORAL_TABLET | ORAL | Status: DC

## 2015-09-06 MED ORDER — PREDNISONE 10 MG PO TABS: ORAL_TABLET | ORAL | Status: DC

## 2015-09-06 NOTE — Progress Notes (Signed)
Lab only visit. CMP, Lipid, Urine Microalbumin, POCT Glucose, POCT HgA1C

## 2015-09-06 NOTE — Addendum Note (Signed)
Addended by: Orion Crook on: 09/06/2015 12:45 PM   Modules accepted: Orders

## 2015-09-07 LAB — CBC WITH DIFFERENTIAL/PLATELET
BASOS ABS: 0.1 10*3/uL (ref 0.0–0.1)
Basophils Relative: 1 % (ref 0–1)
EOS PCT: 5 % (ref 0–5)
Eosinophils Absolute: 0.4 10*3/uL (ref 0.0–0.7)
HEMATOCRIT: 41.5 % (ref 39.0–52.0)
Hemoglobin: 13.6 g/dL (ref 13.0–17.0)
LYMPHS PCT: 27 % (ref 12–46)
Lymphs Abs: 1.9 10*3/uL (ref 0.7–4.0)
MCH: 27.6 pg (ref 26.0–34.0)
MCHC: 32.8 g/dL (ref 30.0–36.0)
MCV: 84.3 fL (ref 78.0–100.0)
MPV: 8.6 fL (ref 8.6–12.4)
Monocytes Absolute: 0.8 10*3/uL (ref 0.1–1.0)
Monocytes Relative: 11 % (ref 3–12)
NEUTROS PCT: 56 % (ref 43–77)
Neutro Abs: 4 10*3/uL (ref 1.7–7.7)
PLATELETS: 326 10*3/uL (ref 150–400)
RBC: 4.92 MIL/uL (ref 4.22–5.81)
RDW: 14.2 % (ref 11.5–15.5)
WBC: 7.1 10*3/uL (ref 4.0–10.5)

## 2015-09-08 ENCOUNTER — Encounter: Payer: Self-pay | Admitting: Family Medicine

## 2015-09-13 ENCOUNTER — Ambulatory Visit (INDEPENDENT_AMBULATORY_CARE_PROVIDER_SITE_OTHER): Payer: 59 | Admitting: Family Medicine

## 2015-09-13 VITALS — BP 128/74 | HR 62 | Temp 97.9°F | Resp 16 | Ht 75.5 in | Wt 218.6 lb

## 2015-09-13 DIAGNOSIS — J209 Acute bronchitis, unspecified: Secondary | ICD-10-CM

## 2015-09-13 MED ORDER — AZITHROMYCIN 500 MG PO TABS: 500.0000 mg | ORAL_TABLET | Freq: Every day | ORAL | Status: DC

## 2015-09-13 MED ORDER — ALBUTEROL SULFATE 108 (90 BASE) MCG/ACT IN AEPB: 2.0000 | INHALATION_SPRAY | RESPIRATORY_TRACT | Status: DC | PRN

## 2015-09-13 MED ORDER — HYDROCODONE-ACETAMINOPHEN 7.5-325 MG/15ML PO SOLN: 10.0000 mL | Freq: Four times a day (QID) | ORAL | Status: DC | PRN

## 2015-09-13 NOTE — Progress Notes (Signed)
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  Objective: Few expiratory wheezes BP 128/74 mmHg  Pulse 62  Temp(Src) 97.9 F (36.6 C) (Oral)  Resp 16  Ht 6' 3.5" (1.918 m)  Wt 218 lb 9.6 oz (99.156 kg)  BMI 26.95 kg/m2  SpO2 96% HEENT: Unremarkable      ICD-9-CM ICD-10-CM   1. Acute bronchitis, unspecified organism 466.0 J20.9 Albuterol Sulfate (PROAIR RESPICLICK) 123XX123 (90 BASE) MCG/ACT AEPB     HYDROcodone-acetaminophen (HYCET) 7.5-325 mg/15 ml solution     azithromycin (ZITHROMAX) 500 MG tablet     Signed, [name], MD

## 2015-10-18 ENCOUNTER — Other Ambulatory Visit: Payer: Self-pay | Admitting: Emergency Medicine

## 2015-10-18 DIAGNOSIS — E119 Type 2 diabetes mellitus without complications: Secondary | ICD-10-CM

## 2015-10-18 LAB — GLUCOSE, POCT (MANUAL RESULT ENTRY): POC GLUCOSE: 141 mg/dL — AB (ref 70–99)

## 2015-10-18 LAB — POCT GLYCOSYLATED HEMOGLOBIN (HGB A1C): HEMOGLOBIN A1C: 6.4

## 2015-10-19 ENCOUNTER — Other Ambulatory Visit: Payer: Self-pay | Admitting: Emergency Medicine

## 2015-11-03 ENCOUNTER — Other Ambulatory Visit: Payer: Self-pay | Admitting: Emergency Medicine

## 2015-11-03 MED FILL — METOPROLOL SUCC ER 50 MG TA: 50 | 90 days supply | Qty: 90 | Fill #3

## 2015-11-03 MED FILL — CARTIA XT 240 MG CAPSULE SA: 240 | 90 days supply | Qty: 90 | Fill #2

## 2015-11-03 MED FILL — ATORVASTATIN 40 MG TABLET: 40 | 90 days supply | Qty: 90 | Fill #0 | Status: TO

## 2015-11-04 MED FILL — OMEPRAZOLE DR 20 MG CAPSULE: 20 | 90 days supply | Qty: 90 | Fill #0

## 2015-11-09 MED FILL — ZOLPIDEM TARTRATE 10 MG TAB: 10 | 90 days supply | Qty: 90 | Fill #1

## 2015-11-11 ENCOUNTER — Encounter: Payer: Self-pay | Admitting: Internal Medicine

## 2015-11-15 DIAGNOSIS — H40023 Open angle with borderline findings, high risk, bilateral: Secondary | ICD-10-CM | POA: Diagnosis not present

## 2015-11-15 DIAGNOSIS — H04123 Dry eye syndrome of bilateral lacrimal glands: Secondary | ICD-10-CM | POA: Diagnosis not present

## 2015-11-15 LAB — HM DIABETES EYE EXAM

## 2015-11-16 MED FILL — VENTOLIN HFA 90 MCG INHALER: 108 (90 BAS | 90 days supply | Qty: 54 | Fill #0

## 2015-11-19 ENCOUNTER — Encounter: Payer: Self-pay | Admitting: Family Medicine

## 2015-11-23 DIAGNOSIS — M25561 Pain in right knee: Secondary | ICD-10-CM | POA: Diagnosis not present

## 2015-12-21 ENCOUNTER — Other Ambulatory Visit: Payer: Self-pay | Admitting: Emergency Medicine

## 2015-12-21 MED FILL — AZELASTINE 0.15% NASAL SPRY: 0.15 | 50 days supply | Qty: 30 | Fill #1

## 2015-12-21 MED FILL — METFORMIN HCL ER 500 MG TAB: 500 | 90 days supply | Qty: 180 | Fill #1 | Status: TO

## 2015-12-21 MED FILL — AZITHROMYCIN 500 MG TABLET: 500 | 5 days supply | Qty: 5 | Fill #1

## 2015-12-21 MED FILL — FLUTICASONE PROP 50 MCG SPR: 50 | 90 days supply | Qty: 48 | Fill #0 | Status: TO

## 2015-12-23 MED FILL — predniSONE 10 MG TABS: 10 | 20 days supply | Qty: 40 | Fill #0 | Status: TO

## 2015-12-23 MED FILL — ASMANEX TWIST 220 MCG-60: 220 | 90 days supply | Qty: 3 | Fill #0

## 2015-12-23 NOTE — Telephone Encounter (Signed)
Dr Everlene Farrier, do you want to give RFs, as you have done in the past?

## 2015-12-28 DIAGNOSIS — M1711 Unilateral primary osteoarthritis, right knee: Secondary | ICD-10-CM | POA: Diagnosis not present

## 2015-12-28 MED FILL — HYDROCODON-APAP 5-325: 5-325 | 30 days supply | Qty: 60 | Fill #0

## 2015-12-28 MED FILL — LIDOCAINE 5% PATCH: 5 | 30 days supply | Qty: 30 | Fill #0 | Status: TO

## 2015-12-28 MED FILL — METHOCARBAMOL 750 MG TABLET: 750 | 30 days supply | Qty: 60 | Fill #0 | Status: TO

## 2016-01-13 ENCOUNTER — Other Ambulatory Visit (INDEPENDENT_AMBULATORY_CARE_PROVIDER_SITE_OTHER): Payer: 59

## 2016-01-13 ENCOUNTER — Other Ambulatory Visit: Payer: Self-pay | Admitting: Emergency Medicine

## 2016-01-13 DIAGNOSIS — R739 Hyperglycemia, unspecified: Secondary | ICD-10-CM

## 2016-01-13 DIAGNOSIS — E871 Hypo-osmolality and hyponatremia: Secondary | ICD-10-CM

## 2016-01-13 DIAGNOSIS — E785 Hyperlipidemia, unspecified: Secondary | ICD-10-CM

## 2016-01-13 DIAGNOSIS — R7309 Other abnormal glucose: Secondary | ICD-10-CM | POA: Diagnosis not present

## 2016-01-13 LAB — LIPID PANEL
CHOLESTEROL: 148 mg/dL (ref 125–200)
HDL: 52 mg/dL (ref >=40)
LDL CALC: 79 mg/dL (ref <130)
TRIGLYCERIDES: 87 mg/dL (ref <150)
Total CHOL/HDL Ratio: 2.8 Ratio (ref <=5.0)
VLDL: 17 mg/dL (ref <30)

## 2016-01-13 LAB — BASIC METABOLIC PANEL WITH GFR
BUN: 12 mg/dL (ref 7–25)
CALCIUM: 9.2 mg/dL (ref 8.6–10.3)
CO2: 26 mmol/L (ref 20–31)
CREATININE: 0.8 mg/dL (ref 0.70–1.25)
Chloride: 98 mmol/L (ref 98–110)
Glucose, Bld: 123 mg/dL — ABNORMAL HIGH (ref 65–99)
Potassium: 4.8 mmol/L (ref 3.5–5.3)
SODIUM: 131 mmol/L — AB (ref 135–146)

## 2016-01-13 LAB — GLUCOSE, POCT (MANUAL RESULT ENTRY): POC GLUCOSE: 131 mg/dL — AB (ref 70–99)

## 2016-01-13 LAB — TSH: TSH: 1.67 mIU/L (ref 0.40–4.50)

## 2016-01-13 LAB — POCT GLYCOSYLATED HEMOGLOBIN (HGB A1C): Hemoglobin A1C: 6.1

## 2016-01-14 LAB — MICROALBUMIN, URINE: Microalb, Ur: 0.5 mg/dL

## 2016-01-30 ENCOUNTER — Other Ambulatory Visit: Payer: Self-pay | Admitting: *Deleted

## 2016-01-30 DIAGNOSIS — M2550 Pain in unspecified joint: Secondary | ICD-10-CM

## 2016-01-30 DIAGNOSIS — I10 Essential (primary) hypertension: Secondary | ICD-10-CM

## 2016-01-30 DIAGNOSIS — E785 Hyperlipidemia, unspecified: Secondary | ICD-10-CM

## 2016-01-30 DIAGNOSIS — M791 Myalgia, unspecified site: Secondary | ICD-10-CM

## 2016-01-30 DIAGNOSIS — Z125 Encounter for screening for malignant neoplasm of prostate: Secondary | ICD-10-CM

## 2016-01-30 DIAGNOSIS — R5383 Other fatigue: Secondary | ICD-10-CM

## 2016-01-31 ENCOUNTER — Other Ambulatory Visit: Payer: Self-pay

## 2016-01-31 ENCOUNTER — Ambulatory Visit (INDEPENDENT_AMBULATORY_CARE_PROVIDER_SITE_OTHER): Payer: 59 | Admitting: Emergency Medicine

## 2016-01-31 VITALS — BP 138/84 | HR 67 | Temp 97.7°F | Resp 16 | Ht 76.75 in | Wt 225.0 lb

## 2016-01-31 DIAGNOSIS — M7122 Synovial cyst of popliteal space [Baker], left knee: Secondary | ICD-10-CM | POA: Diagnosis not present

## 2016-01-31 DIAGNOSIS — M791 Myalgia, unspecified site: Secondary | ICD-10-CM

## 2016-01-31 DIAGNOSIS — I10 Essential (primary) hypertension: Secondary | ICD-10-CM

## 2016-01-31 DIAGNOSIS — M1711 Unilateral primary osteoarthritis, right knee: Secondary | ICD-10-CM | POA: Diagnosis not present

## 2016-01-31 DIAGNOSIS — M2550 Pain in unspecified joint: Secondary | ICD-10-CM

## 2016-01-31 DIAGNOSIS — M1712 Unilateral primary osteoarthritis, left knee: Secondary | ICD-10-CM | POA: Diagnosis not present

## 2016-01-31 DIAGNOSIS — R319 Hematuria, unspecified: Secondary | ICD-10-CM

## 2016-01-31 DIAGNOSIS — E871 Hypo-osmolality and hyponatremia: Secondary | ICD-10-CM

## 2016-01-31 DIAGNOSIS — Z125 Encounter for screening for malignant neoplasm of prostate: Secondary | ICD-10-CM | POA: Diagnosis not present

## 2016-01-31 DIAGNOSIS — E119 Type 2 diabetes mellitus without complications: Secondary | ICD-10-CM

## 2016-01-31 DIAGNOSIS — M25561 Pain in right knee: Secondary | ICD-10-CM | POA: Diagnosis not present

## 2016-01-31 DIAGNOSIS — Z0000 Encounter for general adult medical examination without abnormal findings: Secondary | ICD-10-CM

## 2016-01-31 DIAGNOSIS — R5383 Other fatigue: Secondary | ICD-10-CM

## 2016-01-31 DIAGNOSIS — M25562 Pain in left knee: Secondary | ICD-10-CM | POA: Diagnosis not present

## 2016-01-31 DIAGNOSIS — E785 Hyperlipidemia, unspecified: Secondary | ICD-10-CM

## 2016-01-31 LAB — SEDIMENTATION RATE: Sed Rate: 1 mm/hr (ref 0–20)

## 2016-01-31 LAB — POC MICROSCOPIC URINALYSIS (UMFC): Mucus: ABSENT

## 2016-01-31 LAB — POCT URINALYSIS DIP (MANUAL ENTRY)
BILIRUBIN UA: NEGATIVE
BILIRUBIN UA: NEGATIVE
GLUCOSE UA: NEGATIVE
Leukocytes, UA: NEGATIVE
NITRITE UA: NEGATIVE
Protein Ur, POC: NEGATIVE
Spec Grav, UA: 1.015
Urobilinogen, UA: 0.2
pH, UA: 7

## 2016-01-31 LAB — CBC WITH DIFFERENTIAL/PLATELET
BASOS PCT: 1 %
Basophils Absolute: 73 cells/uL (ref 0–200)
Eosinophils Absolute: 219 cells/uL (ref 15–500)
Eosinophils Relative: 3 %
HCT: 41.2 % (ref 38.5–50.0)
Hemoglobin: 13.9 g/dL (ref 13.2–17.1)
LYMPHS PCT: 28 %
Lymphs Abs: 2044 cells/uL (ref 850–3900)
MCH: 28.5 pg (ref 27.0–33.0)
MCHC: 33.7 g/dL (ref 32.0–36.0)
MCV: 84.6 fL (ref 80.0–100.0)
MONO ABS: 1095 {cells}/uL — AB (ref 200–950)
MONOS PCT: 15 %
MPV: 8.6 fL (ref 7.5–12.5)
Neutro Abs: 3869 cells/uL (ref 1500–7800)
Neutrophils Relative %: 53 %
PLATELETS: 334 10*3/uL (ref 140–400)
RBC: 4.87 MIL/uL (ref 4.20–5.80)
RDW: 14.3 % (ref 11.0–15.0)
WBC: 7.3 10*3/uL (ref 3.8–10.8)

## 2016-01-31 MED ORDER — METFORMIN HCL ER 500 MG PO TB24: ORAL_TABLET | ORAL | Status: AC

## 2016-01-31 NOTE — Progress Notes (Addendum)
Subjective:  This chart was scribed for Kyle [name], by Tamsen Roers, at [facility].  This Kyle [name] was seen in room 3 and the Kyle [name]'s care was started at 10:56 AM.    Kyle [name]: Kyle [name], male    DOB: 1946-08-14, 70 y.o.   MRN: [id number] Chief Complaint  Kyle [name] presents with  . Joint Pain    x 1 month  . Fatigue  . Muscle aches    HPI  HPI Comments: Kyle [name] is a 70 y.o. male who [facility] complaining of joint pain- knees bilaterally but worse in left (onset 1 month ago). Kyle [name] is having pain getting up from a low sitting position to a standing position and significantly difficulty walking up and down his steps  He was able to ride the bike at the gym yesterday evening.  He was on prednisone 1 week ago.  Kyle [name] is leaving here to go see the orthopedist regarding his joint problems.   He would like an increase in his metformin to three times per day.  Kyle [name] will be seeing Kyle. Rosana [name] in the near future. He has had an EKG completed recently.   He has had a change in his flow recently and would like a PSA test completed today.   Kyle [name] Active Problem List   Diagnosis Date Noted  . Arthritis of knee 04/26/2015  . Primary osteoarthritis of right knee 04/25/2015  . Closed fracture of right patella 04/25/2015  . 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 Posterior tibial tendonitis 08/07/2013  . Precordial pain 05/26/2013  . Essential hypertension 10/19/2009  . Diabetes type 2, controlled ([place]) 10/15/2009  . Hyperlipemia 10/15/2009  . CAD, NATIVE VESSEL 10/15/2009   Past Medical History  Diagnosis Date  . Hypertension   . Tubular adenoma 2006  . Allergy   . Arthritis   . Asthma     pt. states very mild  . Coronary artery disease     non obstructive  . GERD (gastroesophageal reflux disease)     omeprazole PRN  . Weakness of right arm   . Insomnia     ambien  . Complication of anesthesia    pt. states that neck is arthritic and can not move neck very well.   . Difficult intubation   . Hypercholesterolemia   . Glucose intolerance (pre-diabetes) dx'd 03/2015  . DJD (degenerative joint disease)     "bad" (04/27/2015)  . Basal cell carcinoma     "shoulder"  . Squamous carcinoma ([place])     "left chest/abd"   Past Surgical History  Procedure Laterality Date  . Total hip arthroplasty Left 2004  . Salivary gland surgery  1999    Kyle [name]  . Left heart catheterization with coronary angiogram N/A 05/26/2013    Procedure: LEFT HEART CATHETERIZATION WITH CORONARY ANGIOGRAM;  Surgeon: Burnell Blanks, [name];  Location: [facility] CATH LAB;  Service: Cardiovascular;  Laterality: N/A;  . Joint replacement    . Colonoscopy      pt. states that may have had a polyp  . Total knee arthroplasty Right 04/26/2015    Procedure: RIGHT TOTAL KNEE ARTHROPLASTY;  Surgeon: Kyle Pear, [name];  Location: [facility];  Service: Orthopedics;  Laterality: Right;  RIGHT TOTAL KNEE ARTHROPLASTY  . Knee arthroscopy Right X 2  . Cardiac catheterization  2007; 2014  . Eye surgery    . Blepharoplasty Bilateral 2000's  . Basal cell carcinoma excision      "  shoulder"  . Squamous cell carcinoma excision Left     "chest/abd"   Allergies  Allergen Reactions  . Ceftin [Cefuroxime Axetil] Anaphylaxis  . Cefuroxime Axetil Anaphylaxis and Hives  . Celecoxib Anaphylaxis and Hives  . Nsaids Anaphylaxis and Hives    Pt takes aspirin every other day  . Other     Hand sanitizers- angioedema can tolerate CHG, alcohol prep, and betadine   . Penicillins Hives  . Sulfa Antibiotics Hives  . Adhesive [Tape] Rash  . Clindamycin/Lincomycin Rash  . Doxycycline Rash   Prior to Admission medications   Medication Sig Start Date End Date Taking? Authorizing Provider  Albuterol Sulfate (PROAIR RESPICLICK) 123XX123 (90 BASE) MCG/ACT AEPB Inhale 2 puffs into the lungs every 4 (four) hours as needed. 09/13/15  Yes [name], [name]  ASMANEX  60 METERED DOSES 220 MCG/INH inhaler INHALE 2 PUFFS ONCE DAILY 12/23/15  Yes Darlyne Russian, [name]  aspirin 81 MG tablet Take 81 mg by mouth daily.   Yes Historical Provider, [name]  atorvastatin (LIPITOR) 40 MG tablet Take 1 tablet (40 mg total) by mouth at bedtime. 04/26/15  Yes Leighton Parody, PA-C  Azelastine HCl (ASTEPRO) 0.15 % SOLN Place 2 sprays into the nose daily as needed (allergies). 04/26/15  Yes Leighton Parody, PA-C  B Complex Vitamins (B COMPLEX PO) Take 1 tablet by mouth 3 (three) times a week.   Yes Historical Provider, [name]  cetirizine (ZYRTEC) 10 MG tablet Take 10 mg by mouth daily as needed for allergies.   Yes Historical Provider, [name]  clobetasol ointment (TEMOVATE) 0.05 % APPLY TOPICALLY TWICE A DAY AS NEEDED SKIN IRRITATIONS 04/26/15  Yes Leighton Parody, PA-C  diltiazem (CARDIZEM CD) 240 MG 24 hr capsule Take 2 capsules (480 mg total) by mouth daily. 04/26/15  Yes Leighton Parody, PA-C  EPINEPHrine 0.3 mg/0.3 mL IJ SOAJ injection Inject 0.3 mLs (0.3 mg total) into the muscle as needed (for allergic reactions). 06/25/15  Yes Darlyne Russian, [name]  fluticasone (FLONASE) 50 MCG/ACT nasal spray INSTILL 2 SPRAYS INTO NOSTRIL ONCE DAILY 10/19/15  Yes Darlyne Russian, [name]  metFORMIN (GLUCOPHAGE XR) 500 MG 24 hr tablet Take 2 tablets daily 09/06/15  Yes Darlyne Russian, [name]  methocarbamol (ROBAXIN) 750 MG tablet Take 1 tablet (750 mg total) by mouth every 8 (eight) hours as needed for muscle spasms. 03/25/15  Yes [name], [name]  multivitamin (ONE-A-DAY MEN'S) TABS tablet Take 1 tablet by mouth 3 (three) times a week.   Yes Historical Provider, [name]  omeprazole (PRILOSEC) 20 MG capsule Take 1 capsule (20 mg total) by mouth daily as needed (HEARTBURN). 04/26/15  Yes Leighton Parody, PA-C  omeprazole (PRILOSEC) 20 MG capsule TAKE 1 CAPSULE BY MOUTH DAILY. 11/04/15  Yes Darlyne Russian, [name]  predniSONE (DELTASONE) 10 MG tablet TAKE 1 TO 2 TABLETS BY MOUTH DAILY IF NEEDED FOR FLARE OF ARTHRITIS. 12/23/15  Yes Darlyne Russian, [name]  sildenafil (REVATIO) 20 MG tablet TAKE 1 TABLET BY MOUTH EVERY 4 HOURS *5 DOSES PER DAY* 12/22/15  Yes Kyle Jeffery, PA-C  zolpidem (AMBIEN) 10 MG tablet Take 1 tablet (10 mg total) by mouth at bedtime. 08/13/15  Yes [name], [name]  atorvastatin (LIPITOR) 40 MG tablet TAKE 1 TABLET BY MOUTH DAILY FOR CHOLESTEROL Kyle [name] not taking: Reported on 01/31/2016 07/09/15   Darlyne Russian, [name]  atorvastatin (LIPITOR) 40 MG tablet TAKE 1 TABLET BY MOUTH DAILY FOR CHOLESTEROL Kyle [name] not taking: Reported on  01/31/2016 10/19/15   Darlyne Russian, [name]  azithromycin (ZITHROMAX) 500 MG tablet Take 1 tablet (500 mg total) by mouth daily. Kyle [name] not taking: Reported on 01/31/2016 09/13/15   [name], [name]  Elastic Bandages & Supports (BACK SUPPORT L/XL) MISC 1 Units by Does not apply route daily as needed. Kyle [name] not taking: Reported on 09/13/2015 03/24/15   Jaynee Eagles, PA-C  HYDROcodone-acetaminophen (HYCET) 7.5-325 mg/15 ml solution Take 10-15 mLs by mouth every 6 (six) hours as needed. Kyle [name] not taking: Reported on 01/31/2016 09/13/15   [name], [name]  metoprolol succinate (TOPROL-XL) 50 MG 24 hr tablet Take 1 tablet (50 mg total) by mouth at bedtime. 04/26/15   Leighton Parody, PA-C   Social History   Social History  . Marital Status: Married    Spouse Name: N/A  . Number of Children: N/A  . Years of Education: N/A   Occupational History  . Not on file.   Social History Main Topics  . Smoking status: Never Smoker   . Smokeless tobacco: Former Systems developer    Types: Chew     Comment: "chewed in my baseball years"  . Alcohol Use: 1.8 oz/week    1 Glasses of wine, 1 Cans of beer, 1 Shots of liquor per week  . Drug Use: No  . Sexual Activity: Yes   Other Topics Concern  . Not on file   Social History Narrative     Review of Systems  Constitutional: Positive for fatigue.  Eyes: Negative for pain, redness and itching.  Respiratory: Negative for cough, choking and shortness of breath.    Gastrointestinal: Negative for nausea and vomiting.  Musculoskeletal: Positive for myalgias and arthralgias.  Skin: Negative for color change.  Neurological: Negative for seizures, syncope and speech difficulty.       Objective:   Physical Exam Filed Vitals:   01/31/16 1008  BP: 138/84  Pulse: 67  Temp: 97.7 F (36.5 C)  TempSrc: Oral  Resp: 16  Height: 6' 4.75" (1.949 m)  Weight: 225 lb (102.059 kg)  SpO2: 96%    CONSTITUTIONAL: Well developed/well nourished HEAD: Normocephalic/atraumatic EYES: EOMI/PERRL ENMT: Mucous membranes moist NECK: supple no meningeal signs , Carotid- no bruits SPINE/BACK:entire spine nontender CV: Grade 1 systolic murmur left sternal border LUNGS: Lungs are clear to auscultation bilaterally, no apparent distress ABDOMEN: soft, nontender, no rebound or guarding, bowel sounds noted throughout abdomen GU:no cva tenderness prostate is mildly enlarged right lobe greater than left there are no nodules palpable. NEURO: Pt is awake/alert/appropriate, moves all extremitiesx4.  No facial droop.   EXTREMITIES: Severe degenerative changes involving the PIP and DIP joints of both hands, unable to go from a seated to standing position without assistance SKIN: Healed scar over the right knee, appears to be a large popliteal cyst behind left knee PSYCH: no abnormalities of mood noted, alert and oriented to situation EKG shows first-degree block.     Assessment & Plan:  Kyle [name] is having significant musculoskeletal discomfort. He has severe pain behind his left knee. He has difficulty going from a seated to a standing position. He has marked difficulty negotiating steps. He also has had worsening pain in both hands. He is leaving from here to go to see his orthopedist. He is scheduled for a complete physical examination with his new primary care provider later this month. Last hemoglobin A1c checked 2 weeks ago was 6.1.I personally performed the services described  in this documentation, which was scribed in my presence. The recorded  information has been reviewed and is accurate.  Darlyne Russian, [name]

## 2016-01-31 NOTE — Patient Instructions (Signed)
     IF you received an x-ray today, you will receive an invoice from [facility]. Please contact  Radiology at [phone] with questions or concerns regarding your invoice.   IF you received labwork today, you will receive an invoice from [facility]. Please contact [facility] at [phone] with questions or concerns regarding your invoice.   Our billing staff will not be able to assist you with questions regarding bills from these companies.  You will be contacted with the lab results as soon as they are available. The fastest way to get your results is to activate your My Chart account. Instructions are located on the last page of this paperwork. If you have not heard from us regarding the results in 2 weeks, please contact this office.      

## 2016-02-01 LAB — LIPID PANEL
CHOLESTEROL: 133 mg/dL (ref 125–200)
HDL: 64 mg/dL (ref >=40)
LDL Cholesterol: 59 mg/dL (ref <130)
Total CHOL/HDL Ratio: 2.1 Ratio (ref <=5.0)
Triglycerides: 50 mg/dL (ref <150)
VLDL: 10 mg/dL (ref <30)

## 2016-02-01 LAB — COMPLETE METABOLIC PANEL WITH GFR
ALBUMIN: 3.9 g/dL (ref 3.6–5.1)
ALK PHOS: 98 U/L (ref 40–115)
ALT: 23 U/L (ref 9–46)
AST: 19 U/L (ref 10–35)
BILIRUBIN TOTAL: 0.7 mg/dL (ref 0.2–1.2)
BUN: 15 mg/dL (ref 7–25)
CALCIUM: 9 mg/dL (ref 8.6–10.3)
CO2: 25 mmol/L (ref 20–31)
CREATININE: 0.79 mg/dL (ref 0.70–1.25)
Chloride: 98 mmol/L (ref 98–110)
GFR, Est African American: >89 mL/min (ref >=60)
GFR, Est Non African American: >89 mL/min (ref >=60)
Glucose, Bld: 120 mg/dL — ABNORMAL HIGH (ref 65–99)
POTASSIUM: 4.7 mmol/L (ref 3.5–5.3)
Sodium: 133 mmol/L — ABNORMAL LOW (ref 135–146)
TOTAL PROTEIN: 6.3 g/dL (ref 6.1–8.1)

## 2016-02-01 LAB — RHEUMATOID FACTOR: Rhuematoid fact SerPl-aCnc: <10 IU/mL (ref <=14)

## 2016-02-01 LAB — CK: CK TOTAL: 121 U/L (ref 7–232)

## 2016-02-01 LAB — PSA, MEDICARE: PSA: 0.93 ng/mL (ref <=4.00)

## 2016-02-01 LAB — C-REACTIVE PROTEIN

## 2016-02-01 LAB — VITAMIN D 25 HYDROXY (VIT D DEFICIENCY, FRACTURES): Vit D, 25-Hydroxy: 55 ng/mL (ref 30–100)

## 2016-02-01 LAB — ANA: ANA: NEGATIVE

## 2016-02-02 ENCOUNTER — Telehealth: Payer: Self-pay | Admitting: Emergency Medicine

## 2016-02-02 ENCOUNTER — Telehealth: Payer: Self-pay

## 2016-02-02 DIAGNOSIS — R319 Hematuria, unspecified: Secondary | ICD-10-CM

## 2016-02-02 NOTE — Telephone Encounter (Signed)
Called and spoke with patient. He will return for a repeat CBC with a path review. He does have hematuria. I will schedule an appointment for him to see Dr. Diona Fanti  or Dr. Gaynelle Arabian. He will also have a CT abdomen pelvis no contrast for evaluation.

## 2016-02-02 NOTE — Telephone Encounter (Signed)
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 I verbally gave him his results over the phone. Dr. Everlene Farrier, Dr. Elder Cyphers wanted to know about his CBC results. I read him the message that was attached that his results were not accurate. He stated that he wanted you to call him back on his cell.

## 2016-02-04 DIAGNOSIS — M1990 Unspecified osteoarthritis, unspecified site: Secondary | ICD-10-CM | POA: Diagnosis not present

## 2016-02-04 DIAGNOSIS — R3121 Asymptomatic microscopic hematuria: Secondary | ICD-10-CM | POA: Diagnosis not present

## 2016-02-04 DIAGNOSIS — I10 Essential (primary) hypertension: Secondary | ICD-10-CM | POA: Diagnosis not present

## 2016-02-04 DIAGNOSIS — R7309 Other abnormal glucose: Secondary | ICD-10-CM | POA: Diagnosis not present

## 2016-02-05 ENCOUNTER — Ambulatory Visit (INDEPENDENT_AMBULATORY_CARE_PROVIDER_SITE_OTHER): Payer: 59 | Admitting: Family Medicine

## 2016-02-05 ENCOUNTER — Encounter: Payer: Self-pay | Admitting: Family Medicine

## 2016-02-05 ENCOUNTER — Other Ambulatory Visit: Payer: Self-pay | Admitting: Family Medicine

## 2016-02-05 DIAGNOSIS — G4700 Insomnia, unspecified: Secondary | ICD-10-CM | POA: Diagnosis not present

## 2016-02-05 DIAGNOSIS — M25462 Effusion, left knee: Secondary | ICD-10-CM | POA: Diagnosis not present

## 2016-02-05 MED ORDER — ZOLPIDEM TARTRATE 10 MG PO TABS: 10.0000 mg | ORAL_TABLET | Freq: Every day | ORAL | Status: AC

## 2016-02-05 NOTE — Progress Notes (Signed)
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 He's done well with the Ambien and has had no side effects such as sleepwalking or disorientation.  He had his left knee drained today. He's been having bilateral knee pain with a very slow recovery after his total knee on the right.  Objective:There were no vitals taken for this visit.  Respirations were 18/m, pulse was 72 We had a long conversation about patient future and his knee and insomnia. Seems to be doing fairly well considering all of these been through. His knees are getting better.  Assessment: Chronic insomnia  Signed, Carola Frost.D.

## 2016-02-10 ENCOUNTER — Telehealth: Payer: Self-pay

## 2016-02-10 DIAGNOSIS — J209 Acute bronchitis, unspecified: Secondary | ICD-10-CM

## 2016-02-10 MED ORDER — AZELASTINE HCL 0.15 % NA SOLN: 2.0000 | Freq: Every day | NASAL | Status: AC | PRN

## 2016-02-10 MED ORDER — ALBUTEROL SULFATE 108 (90 BASE) MCG/ACT IN AEPB: 2.0000 | INHALATION_SPRAY | RESPIRATORY_TRACT | Status: AC | PRN

## 2016-02-10 MED ORDER — ASMANEX (60 METERED DOSES) 220 MCG/INH IN AEPB: INHALATION_SPRAY | RESPIRATORY_TRACT | Status: AC

## 2016-02-10 MED ORDER — OMEPRAZOLE 20 MG PO CPDR: 20.0000 mg | DELAYED_RELEASE_CAPSULE | Freq: Every day | ORAL | Status: DC | PRN

## 2016-02-10 MED ORDER — METOPROLOL SUCCINATE ER 50 MG PO TB24: 50.0000 mg | ORAL_TABLET | Freq: Every day | ORAL | Status: AC

## 2016-02-10 MED ORDER — DILTIAZEM HCL ER COATED BEADS 240 MG PO CP24: 480.0000 mg | ORAL_CAPSULE | Freq: Every day | ORAL | Status: AC

## 2016-02-10 NOTE — Telephone Encounter (Signed)
Pharm sent req for RFs of omeprazole, diltiazem, metoprolol ER, albuterol, asmanex, and azelastine. Sent all RFs.

## 2016-02-22 ENCOUNTER — Other Ambulatory Visit (INDEPENDENT_AMBULATORY_CARE_PROVIDER_SITE_OTHER): Payer: 59

## 2016-02-22 ENCOUNTER — Other Ambulatory Visit: Payer: Self-pay | Admitting: Emergency Medicine

## 2016-02-22 DIAGNOSIS — N5201 Erectile dysfunction due to arterial insufficiency: Secondary | ICD-10-CM

## 2016-02-22 DIAGNOSIS — M25462 Effusion, left knee: Secondary | ICD-10-CM | POA: Diagnosis not present

## 2016-02-22 DIAGNOSIS — R7989 Other specified abnormal findings of blood chemistry: Secondary | ICD-10-CM | POA: Diagnosis not present

## 2016-02-22 DIAGNOSIS — R799 Abnormal finding of blood chemistry, unspecified: Secondary | ICD-10-CM | POA: Diagnosis not present

## 2016-02-22 LAB — CBC
HCT: 41 % (ref 38.5–50.0)
Hemoglobin: 13.9 g/dL (ref 13.2–17.1)
MCH: 28.5 pg (ref 27.0–33.0)
MCHC: 33.9 g/dL (ref 32.0–36.0)
MCV: 84.2 fL (ref 80.0–100.0)
MPV: 8.5 fL (ref 7.5–12.5)
PLATELETS: 296 10*3/uL (ref 140–400)
RBC: 4.87 MIL/uL (ref 4.20–5.80)
RDW: 14.5 % (ref 11.0–15.0)
WBC: 7.1 10*3/uL (ref 3.8–10.8)

## 2016-02-22 MED ORDER — SILDENAFIL CITRATE 20 MG PO TABS: ORAL_TABLET | ORAL | Status: AC

## 2016-02-23 LAB — PATHOLOGIST SMEAR REVIEW

## 2016-02-24 LAB — PROTEIN ELECTROPHORESIS, SERUM
Albumin ELP: 3.8 g/dL (ref 3.8–4.8)
Alpha-1-Globulin: 0.4 g/dL — ABNORMAL HIGH (ref 0.2–0.3)
Alpha-2-Globulin: 0.7 g/dL (ref 0.5–0.9)
BETA 2: 0.3 g/dL (ref 0.2–0.5)
BETA GLOBULIN: 0.5 g/dL (ref 0.4–0.6)
Gamma Globulin: 0.8 g/dL (ref 0.8–1.7)
TOTAL PROTEIN, SERUM ELECTROPHOR: 6.4 g/dL (ref 6.1–8.1)

## 2016-02-24 LAB — IMMUNOFIXATION ELECTROPHORESIS
IgA: 172 mg/dL (ref 68–379)
IgG (Immunoglobin G), Serum: 812 mg/dL (ref 650–1600)
IgM, Serum: 43 mg/dL (ref 41–251)

## 2016-02-25 ENCOUNTER — Other Ambulatory Visit: Payer: Self-pay | Admitting: Emergency Medicine

## 2016-02-25 DIAGNOSIS — R7989 Other specified abnormal findings of blood chemistry: Secondary | ICD-10-CM

## 2016-03-06 DIAGNOSIS — M25462 Effusion, left knee: Secondary | ICD-10-CM | POA: Diagnosis not present

## 2016-03-07 ENCOUNTER — Other Ambulatory Visit: Payer: Self-pay | Admitting: Family Medicine

## 2016-03-08 ENCOUNTER — Other Ambulatory Visit: Payer: Self-pay | Admitting: Family Medicine

## 2016-03-08 DIAGNOSIS — M25562 Pain in left knee: Secondary | ICD-10-CM

## 2016-03-09 ENCOUNTER — Other Ambulatory Visit: Payer: Self-pay | Admitting: Orthopedic Surgery

## 2016-03-10 ENCOUNTER — Encounter (HOSPITAL_BASED_OUTPATIENT_CLINIC_OR_DEPARTMENT_OTHER): Payer: Self-pay | Admitting: *Deleted

## 2016-03-13 ENCOUNTER — Other Ambulatory Visit: Payer: Self-pay | Admitting: Internal Medicine

## 2016-03-14 DIAGNOSIS — H2513 Age-related nuclear cataract, bilateral: Secondary | ICD-10-CM | POA: Diagnosis not present

## 2016-03-14 DIAGNOSIS — H40023 Open angle with borderline findings, high risk, bilateral: Secondary | ICD-10-CM | POA: Diagnosis not present

## 2016-03-15 ENCOUNTER — Ambulatory Visit
Admission: RE | Admit: 2016-03-15 | Discharge: 2016-03-15 | Disposition: A | Discharge status: Home / Self Care | Payer: 59 | Source: Ambulatory Visit | Attending: Family Medicine | Admitting: Family Medicine

## 2016-03-15 ENCOUNTER — Ambulatory Visit
Admission: RE | Admit: 2016-03-15 | Discharge: 2016-03-15 | Disposition: A | Discharge status: Home / Self Care | Payer: 59 | Source: Ambulatory Visit | Attending: Emergency Medicine | Admitting: Emergency Medicine

## 2016-03-15 DIAGNOSIS — M25562 Pain in left knee: Secondary | ICD-10-CM

## 2016-03-15 DIAGNOSIS — R319 Hematuria, unspecified: Secondary | ICD-10-CM

## 2016-03-15 DIAGNOSIS — S83232A Complex tear of medial meniscus, current injury, left knee, initial encounter: Secondary | ICD-10-CM | POA: Diagnosis not present

## 2016-03-15 DIAGNOSIS — R3129 Other microscopic hematuria: Secondary | ICD-10-CM | POA: Diagnosis not present

## 2016-03-15 NOTE — Progress Notes (Signed)
Attempted to call pt, left VM to call back asap

## 2016-03-16 NOTE — H&P (Signed)
Kyle [name] is an 70 y.o. male.   Chief Complaint:  Left Knee PAIN  HPI: Kyle [name] is a 70 year old established patient who has a history of left knee primary osteoarthritis with an apparent mid body medial meniscal tear with displacement and a symptomatic Baker cyst.  He status post a right TKR 04/26/15 per Dr. Mayer Camel.  He has had aspiration of the left Baker cyst on 2 separate occasions on 01/31/16 and 02/22/16.  He called in yesterday reporting aggravation of his pain and reaccumulation of the knee swelling.  He's here today for further evaluation and management.  Past Medical History  Diagnosis Date  . Hypertension   . Tubular adenoma 2006  . Allergy   . Arthritis   . Asthma     pt. states very mild  . Coronary artery disease     non obstructive  . GERD (gastroesophageal reflux disease)     omeprazole PRN  . Weakness of right arm   . Insomnia     ambien  . Complication of anesthesia     pt. states that neck is arthritic and can not move neck very well.   . Difficult intubation   . Hypercholesterolemia   . Glucose intolerance (pre-diabetes) dx'd 03/2015  . DJD (degenerative joint disease)     "bad" (04/27/2015)  . Basal cell carcinoma     "shoulder"  . Squamous carcinoma ([place])     "left chest/abd"  . Diabetes mellitus without complication [facility])     Past Surgical History  Procedure Laterality Date  . Total hip arthroplasty Left 2004  . Salivary gland surgery  1999    Dr Thornell Mule  . Left heart catheterization with coronary angiogram N/A 05/26/2013    Procedure: LEFT HEART CATHETERIZATION WITH CORONARY ANGIOGRAM;  Surgeon: Burnell Blanks, MD;  Location: [facility] CATH LAB;  Service: Cardiovascular;  Laterality: N/A;  . Joint replacement    . Colonoscopy      pt. states that may have had a polyp  . Total knee arthroplasty Right 04/26/2015    Procedure: RIGHT TOTAL KNEE ARTHROPLASTY;  Surgeon: Frederik Pear, MD;  Location: [place];  Service: Orthopedics;  Laterality: Right;  RIGHT TOTAL  KNEE ARTHROPLASTY  . Knee arthroscopy Right X 2  . Cardiac catheterization  2007; 2014  . Eye surgery    . Blepharoplasty Bilateral 2000's  . Basal cell carcinoma excision      "shoulder"  . Squamous cell carcinoma excision Left     "chest/abd"    Family History  Problem Relation Age of Onset  . Coronary artery disease    . Heart disease Mother   . Heart disease Father   . Coronary artery disease Sister    Social History:  reports that he has never smoked. He has quit using smokeless tobacco. His smokeless tobacco use included Chew. He reports that he drinks about 1.8 oz of alcohol per week. He reports that he does not use illicit drugs.  Allergies:  Allergies  Allergen Reactions  . Ceftin [Cefuroxime Axetil] Anaphylaxis  . Cefuroxime Axetil Anaphylaxis and Hives  . Celecoxib Anaphylaxis and Hives  . Nsaids Anaphylaxis and Hives    Pt takes aspirin every other day  . Other     Hand sanitizers- angioedema can tolerate CHG, alcohol prep, and betadine   . Penicillins Hives  . Sulfa Antibiotics Hives  . Adhesive [Tape] Rash  . Clindamycin/Lincomycin Rash  . Doxycycline Rash    No prescriptions prior to admission  No results found for this or any previous visit (from the past 48 hour(s)). Ct Abdomen Pelvis Wo Contrast  03/15/2016  CLINICAL DATA:  Microhematuria for several years EXAM: CT ABDOMEN AND PELVIS WITHOUT CONTRAST TECHNIQUE: Multidetector CT imaging of the abdomen and pelvis was performed following the standard protocol without IV contrast. COMPARISON:  None. FINDINGS: Lung bases are free of acute infiltrate or sizable effusion. The liver, gallbladder, spleen, adrenal glands and pancreas are within normal limits. The kidneys are well visualized bilaterally without renal calculi or obstructive changes. No definitive mass lesion is noted. Diffuse aortoiliac calcifications are noted without aneurysmal dilatation. The appendix is not well visualized. No inflammatory  changes to suggest appendicitis are seen. The bladder is well distended. No pelvic mass lesion or sidewall abnormality is noted. Scattered diverticular changes noted in the colon without evidence of diverticulitis. The lumbar spine shows degenerative changes. Left hip replacement is noted. IMPRESSION: No acute abnormality noted. No findings to correspond with the long-standing history of hematuria. Electronically Signed   By: Inez Catalina M.D.   On: 03/15/2016 09:56   Mr Knee Left  Wo Contrast  03/15/2016  ADDENDUM REPORT: 03/15/2016 13:20 ADDENDUM: The original report was by Dr. Van Clines. The following addendum is by Dr. Van Clines: I spoke to Dr. Elder Cyphers regarding his MRI results by telephone at 1:15 p.m. on 03/15/2016. Electronically Signed   By: Van Clines M.D.   On: 03/15/2016 13:20  03/15/2016  CLINICAL DATA:  Left knee pain for 10 weeks. History of meniscal tear and Baker' s cyst. Twisting knee injury walking down stairs. EXAM: MRI OF THE LEFT KNEE WITHOUT CONTRAST TECHNIQUE: Multiplanar, multisequence MR imaging of the knee was performed. No intravenous contrast was administered. COMPARISON:  04/20/2015 FINDINGS: MENISCI Medial meniscus: Irregular and likely degenerative tearing of the posterior horn and midbody with free edge truncation in the midbody, several foci of inferior surface irregularity and tearing, and attenuation of the posterior superior meniscal periphery suspicious for tearing. Lateral meniscus: Grade 3 oblique tear of the posterior horn extending to the inferior surface as on image 23/7, with free edge involvement in the midbody on image 20/4. LIGAMENTS Cruciates:  Unremarkable Collaterals:  Unremarkable CARTILAGE Patellofemoral: Severe chondral thinning laterally in the patellofemoral joint with underlying subcortical marrow edema, images 12-16 series 3. Moderate chondral thinning along the medial patellar facet. Lateral spurring of the patella. Medial: Mild  chondromalacia with moderate degenerative chondral thinning. Mild marginal spurring. Lateral: Mild chondromalacia with mild degenerative chondral thinning. Joint: Moderate knee effusion with synovitis. Large Baker's cyst with some internal septations. Popliteal Fossa:  Low-level popliteal infiltrative edema. Extensor Mechanism:  Prepatellar subcutaneous edema. Bones: No significant extra-articular osseous abnormalities identified. IMPRESSION: 1. Complex tear posterior horn and midbody medial meniscus. 2. Oblique and horizontal tearing in the posterior horn and midbody lateral meniscus. 3. Moderate knee effusion with synovitis. Large Baker's cyst with internal septations. 4. Severe patellofemoral chondral thinning laterally with subcortical marrow edema laterally. Otherwise moderate chondral thinning in the patellofemoral joint and mild chondral thinning in the medial and lateral compartments. Medial and lateral compartmental chondromalacia. Electronically Signed: By: Van Clines M.D. On: 03/15/2016 09:48    Review of Systems  Constitutional: Negative.   HENT: Negative.   Eyes: Negative.   Respiratory: Negative.   Cardiovascular: Negative.   Gastrointestinal: Negative.   Genitourinary: Negative.        Weak stream  Musculoskeletal: Positive for joint pain.  Skin: Negative.   Neurological: Negative.   Endo/Heme/Allergies: Negative.  Psychiatric/Behavioral: Negative.     Height 6' 4.75" (1.949 m), weight 102.059 kg (225 lb). Physical Exam  Constitutional: He is oriented to person, place, and time. He appears well-developed and well-nourished.  HENT:  Head: Normocephalic and atraumatic.  Eyes: Pupils are equal, round, and reactive to light.  Neck: Normal range of motion. Neck supple.  Cardiovascular: Intact distal pulses.   Respiratory: Effort normal.  Musculoskeletal: He exhibits tenderness.  He has limited flexion of the knee and slight limitation with extension.  No signs of  infection.    He has some mild swelling in the pretibial region.  No Pain is no induration appreciated.  Neurological: He is alert and oriented to person, place, and time.  Skin: Skin is warm and dry.  Psychiatric: He has a normal mood and affect. His behavior is normal. Judgment and thought content normal.     Assessment/Plan 1.  Left knee reaccumulation of symptomatic Baker cyst. 2.  Left knee DJD with an apparent mid body medial meniscal tear with some displacement.  Plan:   Dr. Mayer Camel was able to sit down and talk to him as well regarding other treatment options including surgery.  Ace wrap, and flexible knee sleeve was applied.  The benefits risks and potential complications of knee arthroscopy are discussed.  We and displacing the patient back at time of surgical intervention.  Kenzy Campoverde R, PA-C 03/16/2016, 5:22 PM

## 2016-03-17 ENCOUNTER — Encounter (HOSPITAL_BASED_OUTPATIENT_CLINIC_OR_DEPARTMENT_OTHER)
Admission: RE | Disposition: A | Discharge status: Home / Self Care | Payer: Self-pay | Source: Ambulatory Visit | Attending: Orthopedic Surgery

## 2016-03-17 ENCOUNTER — Ambulatory Visit (HOSPITAL_BASED_OUTPATIENT_CLINIC_OR_DEPARTMENT_OTHER): Payer: 59 | Admitting: Certified Registered"

## 2016-03-17 ENCOUNTER — Encounter (HOSPITAL_BASED_OUTPATIENT_CLINIC_OR_DEPARTMENT_OTHER): Payer: Self-pay | Admitting: Anesthesiology

## 2016-03-17 ENCOUNTER — Ambulatory Visit (HOSPITAL_BASED_OUTPATIENT_CLINIC_OR_DEPARTMENT_OTHER)
Admission: RE | Admit: 2016-03-17 | Discharge: 2016-03-17 | Disposition: A | Discharge status: Home / Self Care | Payer: 59 | Source: Ambulatory Visit | Attending: Orthopedic Surgery | Admitting: Orthopedic Surgery

## 2016-03-17 DIAGNOSIS — Z85828 Personal history of other malignant neoplasm of skin: Secondary | ICD-10-CM | POA: Diagnosis not present

## 2016-03-17 DIAGNOSIS — K219 Gastro-esophageal reflux disease without esophagitis: Secondary | ICD-10-CM | POA: Insufficient documentation

## 2016-03-17 DIAGNOSIS — M94262 Chondromalacia, left knee: Secondary | ICD-10-CM | POA: Insufficient documentation

## 2016-03-17 DIAGNOSIS — S83282A Other tear of lateral meniscus, current injury, left knee, initial encounter: Secondary | ICD-10-CM | POA: Insufficient documentation

## 2016-03-17 DIAGNOSIS — I10 Essential (primary) hypertension: Secondary | ICD-10-CM | POA: Diagnosis not present

## 2016-03-17 DIAGNOSIS — M23362 Other meniscus derangements, other lateral meniscus, left knee: Secondary | ICD-10-CM | POA: Diagnosis not present

## 2016-03-17 DIAGNOSIS — Z881 Allergy status to other antibiotic agents status: Secondary | ICD-10-CM | POA: Diagnosis not present

## 2016-03-17 DIAGNOSIS — M1990 Unspecified osteoarthritis, unspecified site: Secondary | ICD-10-CM | POA: Diagnosis not present

## 2016-03-17 DIAGNOSIS — M7122 Synovial cyst of popliteal space [Baker], left knee: Secondary | ICD-10-CM | POA: Diagnosis not present

## 2016-03-17 DIAGNOSIS — Z96642 Presence of left artificial hip joint: Secondary | ICD-10-CM | POA: Insufficient documentation

## 2016-03-17 DIAGNOSIS — Z888 Allergy status to other drugs, medicaments and biological substances status: Secondary | ICD-10-CM | POA: Diagnosis not present

## 2016-03-17 DIAGNOSIS — E119 Type 2 diabetes mellitus without complications: Secondary | ICD-10-CM | POA: Insufficient documentation

## 2016-03-17 DIAGNOSIS — J45909 Unspecified asthma, uncomplicated: Secondary | ICD-10-CM | POA: Insufficient documentation

## 2016-03-17 DIAGNOSIS — M2242 Chondromalacia patellae, left knee: Secondary | ICD-10-CM | POA: Diagnosis not present

## 2016-03-17 DIAGNOSIS — I2510 Atherosclerotic heart disease of native coronary artery without angina pectoris: Secondary | ICD-10-CM | POA: Insufficient documentation

## 2016-03-17 DIAGNOSIS — Z7984 Long term (current) use of oral hypoglycemic drugs: Secondary | ICD-10-CM | POA: Diagnosis not present

## 2016-03-17 DIAGNOSIS — Z882 Allergy status to sulfonamides status: Secondary | ICD-10-CM | POA: Diagnosis not present

## 2016-03-17 DIAGNOSIS — S83242A Other tear of medial meniscus, current injury, left knee, initial encounter: Secondary | ICD-10-CM | POA: Diagnosis not present

## 2016-03-17 DIAGNOSIS — Z880 Allergy status to penicillin: Secondary | ICD-10-CM | POA: Diagnosis not present

## 2016-03-17 DIAGNOSIS — G4700 Insomnia, unspecified: Secondary | ICD-10-CM | POA: Insufficient documentation

## 2016-03-17 DIAGNOSIS — Z886 Allergy status to analgesic agent status: Secondary | ICD-10-CM | POA: Insufficient documentation

## 2016-03-17 DIAGNOSIS — E7800 Pure hypercholesterolemia, unspecified: Secondary | ICD-10-CM | POA: Diagnosis not present

## 2016-03-17 DIAGNOSIS — M23322 Other meniscus derangements, posterior horn of medial meniscus, left knee: Secondary | ICD-10-CM | POA: Diagnosis not present

## 2016-03-17 HISTORY — PX: KNEE ARTHROSCOPY WITH MEDIAL MENISECTOMY: SHX5651

## 2016-03-17 HISTORY — PX: KNEE ARTHROSCOPY WITH LATERAL MENISECTOMY: SHX6193

## 2016-03-17 HISTORY — PX: KNEE ARTHROSCOPY WITH EXCISION BAKER'S CYST: SHX5646

## 2016-03-17 LAB — GLUCOSE, CAPILLARY
Glucose-Capillary: 111 mg/dL — ABNORMAL HIGH (ref 65–99)
Glucose-Capillary: 135 mg/dL — ABNORMAL HIGH (ref 65–99)

## 2016-03-17 SURGERY — ARTHROSCOPY, KNEE, WITH MEDIAL MENISCECTOMY
Anesthesia: General | Site: Knee | Laterality: Left

## 2016-03-17 MED ORDER — HYDROCODONE-ACETAMINOPHEN 5-325 MG PO TABS: 1.0000 | ORAL_TABLET | Freq: Four times a day (QID) | ORAL | Status: AC | PRN

## 2016-03-17 MED ORDER — DEXAMETHASONE SODIUM PHOSPHATE 10 MG/ML IJ SOLN
INTRAMUSCULAR | Status: AC
  Filled 2016-03-17: qty 1

## 2016-03-17 MED ORDER — LIDOCAINE HCL (CARDIAC) 20 MG/ML IV SOLN
INTRAVENOUS | Status: DC | PRN
  Administered 2016-03-17: 50 mg via INTRAVENOUS

## 2016-03-17 MED ORDER — OXYCODONE HCL 5 MG PO TABS
5.0000 mg | ORAL_TABLET | Freq: Once | ORAL | Status: AC | PRN
  Administered 2016-03-17: 5 mg via ORAL

## 2016-03-17 MED ORDER — DEXAMETHASONE SODIUM PHOSPHATE 4 MG/ML IJ SOLN
INTRAMUSCULAR | Status: DC | PRN
  Administered 2016-03-17: 10 mg via INTRAVENOUS

## 2016-03-17 MED ORDER — BUPIVACAINE-EPINEPHRINE (PF) 0.5% -1:200000 IJ SOLN
INTRAMUSCULAR | Status: AC
  Filled 2016-03-17: qty 30

## 2016-03-17 MED ORDER — LACTATED RINGERS IV SOLN
INTRAVENOUS | Status: DC
  Administered 2016-03-17: 10 mL/h via INTRAVENOUS

## 2016-03-17 MED ORDER — VANCOMYCIN HCL IN DEXTROSE 1-5 GM/200ML-% IV SOLN
1000.0000 mg | INTRAVENOUS | Status: AC
  Administered 2016-03-17: 1000 mg via INTRAVENOUS

## 2016-03-17 MED ORDER — HYDROMORPHONE HCL 1 MG/ML IJ SOLN
INTRAMUSCULAR | Status: AC
  Filled 2016-03-17: qty 1

## 2016-03-17 MED ORDER — VANCOMYCIN HCL IN DEXTROSE 1-5 GM/200ML-% IV SOLN
INTRAVENOUS | Status: AC
Start: 2016-03-17 — End: 2016-03-17
  Filled 2016-03-17: qty 200

## 2016-03-17 MED ORDER — MEPERIDINE HCL 25 MG/ML IJ SOLN: 6.2500 mg | INTRAMUSCULAR | Status: DC | PRN

## 2016-03-17 MED ORDER — OXYCODONE HCL 5 MG PO TABS
ORAL_TABLET | ORAL | Status: AC
  Filled 2016-03-17: qty 1

## 2016-03-17 MED ORDER — ONDANSETRON HCL 4 MG/2ML IJ SOLN
INTRAMUSCULAR | Status: AC
  Filled 2016-03-17: qty 2

## 2016-03-17 MED ORDER — MIDAZOLAM HCL 2 MG/2ML IJ SOLN: 1.0000 mg | INTRAMUSCULAR | Status: DC | PRN

## 2016-03-17 MED ORDER — DEXTROSE-NACL 5-0.45 % IV SOLN: INTRAVENOUS | Status: DC

## 2016-03-17 MED ORDER — EPINEPHRINE HCL 1 MG/ML IJ SOLN
INTRAMUSCULAR | Status: AC
  Filled 2016-03-17: qty 1

## 2016-03-17 MED ORDER — ONDANSETRON HCL 4 MG/2ML IJ SOLN
INTRAMUSCULAR | Status: DC | PRN
  Administered 2016-03-17: 4 mg via INTRAVENOUS

## 2016-03-17 MED ORDER — OXYCODONE HCL 5 MG/5ML PO SOLN: 5.0000 mg | Freq: Once | ORAL | Status: AC | PRN

## 2016-03-17 MED ORDER — GLYCOPYRROLATE 0.2 MG/ML IJ SOLN: 0.2000 mg | Freq: Once | INTRAMUSCULAR | Status: DC | PRN

## 2016-03-17 MED ORDER — CHLORHEXIDINE GLUCONATE 4 % EX LIQD: 60.0000 mL | Freq: Once | CUTANEOUS | Status: DC

## 2016-03-17 MED ORDER — SCOPOLAMINE 1 MG/3DAYS TD PT72: 1.0000 | MEDICATED_PATCH | Freq: Once | TRANSDERMAL | Status: DC | PRN

## 2016-03-17 MED ORDER — FENTANYL CITRATE (PF) 100 MCG/2ML IJ SOLN
50.0000 ug | INTRAMUSCULAR | Status: DC | PRN
  Administered 2016-03-17: 50 ug via INTRAVENOUS

## 2016-03-17 MED ORDER — LIDOCAINE 2% (20 MG/ML) 5 ML SYRINGE
INTRAMUSCULAR | Status: AC
  Filled 2016-03-17: qty 5

## 2016-03-17 MED ORDER — BUPIVACAINE-EPINEPHRINE 0.5% -1:200000 IJ SOLN
INTRAMUSCULAR | Status: DC | PRN
  Administered 2016-03-17: 20 mL

## 2016-03-17 MED ORDER — SODIUM CHLORIDE 0.9 % IR SOLN
Status: DC | PRN
  Administered 2016-03-17: 6000 mL

## 2016-03-17 MED ORDER — PROPOFOL 10 MG/ML IV BOLUS
INTRAVENOUS | Status: DC | PRN
  Administered 2016-03-17: 200 mg via INTRAVENOUS

## 2016-03-17 MED ORDER — BUPIVACAINE HCL (PF) 0.5 % IJ SOLN
INTRAMUSCULAR | Status: AC
  Filled 2016-03-17: qty 30

## 2016-03-17 MED ORDER — HYDROMORPHONE HCL 1 MG/ML IJ SOLN
0.2500 mg | INTRAMUSCULAR | Status: DC | PRN
  Administered 2016-03-17 (×3): 0.5 mg via INTRAVENOUS

## 2016-03-17 MED ORDER — FENTANYL CITRATE (PF) 100 MCG/2ML IJ SOLN
INTRAMUSCULAR | Status: AC
  Filled 2016-03-17: qty 2

## 2016-03-17 MED FILL — HYDROCODON-APAP 5-325: 5-325 | 15 days supply | Qty: 60 | Fill #0

## 2016-03-17 SURGICAL SUPPLY — 43 items
BANDAGE ACE 6X5 VEL STRL LF (GAUZE/BANDAGES/DRESSINGS) ×3 IMPLANT
BLADE 4.2CUDA (BLADE) IMPLANT
BLADE CUTTER GATOR 3.5 (BLADE) ×3 IMPLANT
BLADE GREAT WHITE 4.2 (BLADE) ×3 IMPLANT
BNDG COHESIVE 6X5 TAN STRL LF (GAUZE/BANDAGES/DRESSINGS) ×3 IMPLANT
DRAPE ARTHROSCOPY W/POUCH 114 (DRAPES) ×3 IMPLANT
DURAPREP 26ML APPLICATOR (WOUND CARE) ×3 IMPLANT
ELECT MENISCUS 165MM 90D (ELECTRODE) IMPLANT
ELECT REM PT RETURN 9FT ADLT (ELECTROSURGICAL)
ELECTRODE REM PT RTRN 9FT ADLT (ELECTROSURGICAL) IMPLANT
GAUZE SPONGE 4X4 12PLY STRL (GAUZE/BANDAGES/DRESSINGS) ×3 IMPLANT
GAUZE XEROFORM 1X8 LF (GAUZE/BANDAGES/DRESSINGS) ×3 IMPLANT
GLOVE BIO SURGEON STRL SZ 6.5 (GLOVE) ×3 IMPLANT
GLOVE BIO SURGEON STRL SZ7.5 (GLOVE) ×3 IMPLANT
GLOVE BIO SURGEON STRL SZ8.5 (GLOVE) ×3 IMPLANT
GLOVE BIOGEL PI IND STRL 7.0 (GLOVE) ×4 IMPLANT
GLOVE BIOGEL PI IND STRL 8 (GLOVE) ×2 IMPLANT
GLOVE BIOGEL PI IND STRL 9 (GLOVE) ×2 IMPLANT
GLOVE BIOGEL PI INDICATOR 7.0 (GLOVE) ×2
GLOVE BIOGEL PI INDICATOR 8 (GLOVE) ×1
GLOVE BIOGEL PI INDICATOR 9 (GLOVE) ×1
GLOVE EXAM NITRILE EXT CUFF MD (GLOVE) ×3 IMPLANT
GLOVE SURG SS PI 7.0 STRL IVOR (GLOVE) ×3 IMPLANT
GOWN STRL REUS W/ TWL LRG LVL3 (GOWN DISPOSABLE) ×6 IMPLANT
GOWN STRL REUS W/TWL LRG LVL3 (GOWN DISPOSABLE) ×9
GOWN STRL REUS W/TWL XL LVL3 (GOWN DISPOSABLE) ×3 IMPLANT
IV NS IRRIG 3000ML ARTHROMATIC (IV SOLUTION) ×6 IMPLANT
KNEE WRAP E Z 3 GEL PACK (MISCELLANEOUS) ×3 IMPLANT
MANIFOLD NEPTUNE II (INSTRUMENTS) ×3 IMPLANT
NDL SAFETY ECLIPSE 18X1.5 (NEEDLE) ×2 IMPLANT
NEEDLE HYPO 18GX1.5 SHARP (NEEDLE) ×3
PACK ARTHROSCOPY DSU (CUSTOM PROCEDURE TRAY) ×3 IMPLANT
PACK BASIN DAY SURGERY FS (CUSTOM PROCEDURE TRAY) ×3 IMPLANT
PAD ALCOHOL SWAB (MISCELLANEOUS) ×3 IMPLANT
PENCIL BUTTON HOLSTER BLD 10FT (ELECTRODE) IMPLANT
SET ARTHROSCOPY TUBING (MISCELLANEOUS) ×3
SET ARTHROSCOPY TUBING LN (MISCELLANEOUS) ×2 IMPLANT
SLEEVE SCD COMPRESS KNEE MED (MISCELLANEOUS) IMPLANT
SYR 3ML 18GX1 1/2 (SYRINGE) IMPLANT
SYR 5ML LL (SYRINGE) ×3 IMPLANT
TOWEL OR 17X24 6PK STRL BLUE (TOWEL DISPOSABLE) ×3 IMPLANT
WAND STAR VAC 90 (SURGICAL WAND) IMPLANT
WATER STERILE IRR 1000ML POUR (IV SOLUTION) ×3 IMPLANT

## 2016-03-17 NOTE — Transfer of Care (Signed)
Immediate Anesthesia Transfer of Care Note  Patient: Kyle [name]  Procedure(s) Performed: Procedure(s): ARTHROSCOPY KNEE (Left)  Patient Location: PACU  Anesthesia Type:General  Level of Consciousness: awake and sedated  Airway & Oxygen Therapy: Patient Spontanous Breathing and Patient connected to face mask oxygen  Post-op Assessment: Report given to RN and Post -op Vital signs reviewed and stable  Post vital signs: Reviewed and stable  Last Vitals:  Filed Vitals:   03/17/16 0844  BP: 141/83  Pulse: 64  Temp: 36.8 C  Resp: 18    Last Pain:  Filed Vitals:   03/17/16 0845  PainSc: 5          Complications: No apparent anesthesia complications

## 2016-03-17 NOTE — Anesthesia Postprocedure Evaluation (Signed)
Anesthesia Post Note  Patient: [name]  Procedure(s) Performed: Procedure(s) (LRB): LEFT KNEE ARTHROSCOPY WITH PARTIAL MEDIAL AND LATERAL MENISECTOMY AND DEBRIDEMENT CHONDROMALACIA (Left) LEFT KNEE ARTHROSCOPY WITH LATERAL MENISECTOMY (Left) LEFT KNEE ARTHROSCOPY WITH DRAINAGE OF BAKER'S CYST (Left)  Patient location during evaluation: PACU Anesthesia Type: General Level of consciousness: awake and alert Pain management: pain level controlled Vital Signs Assessment: post-procedure vital signs reviewed and stable Respiratory status: spontaneous breathing, nonlabored ventilation and respiratory function stable Cardiovascular status: blood pressure returned to baseline and stable Postop Assessment: no signs of nausea or vomiting Anesthetic complications: no    Last Vitals:  Filed Vitals:   03/17/16 1200 03/17/16 1215  BP:    Pulse: 59 68  Temp:  36.5 C  Resp: 12 16    Last Pain:  Filed Vitals:   03/17/16 1303  PainSc: 3                  Kyle [name]

## 2016-03-17 NOTE — Anesthesia Preprocedure Evaluation (Signed)
Anesthesia Evaluation  Patient identified by MRN, date of birth, ID band Patient awake    Reviewed: Allergy & Precautions, NPO status , Patient's Chart, lab work & pertinent test results  History of Anesthesia Complications (+) DIFFICULT AIRWAY and history of anesthetic complications (Stiff neck due to cervical operation)  Airway Mallampati: I  TM Distance: >3 FB Neck ROM: Full    Dental  (+) Teeth Intact, Dental Advisory Given   Pulmonary    breath sounds clear to auscultation       Cardiovascular hypertension,  Rhythm:Regular Rate:Normal     Neuro/Psych    GI/Hepatic GERD  Medicated and Controlled,  Endo/Other  diabetes, Well Controlled, Type 2, Oral Hypoglycemic Agents  Renal/GU      Musculoskeletal   Abdominal   Peds  Hematology   Anesthesia Other Findings   Reproductive/Obstetrics                             Anesthesia Physical Anesthesia Plan  ASA: I  Anesthesia Plan: General   Post-op Pain Management:    Induction: Intravenous  Airway Management Planned: LMA  Additional Equipment:   Intra-op Plan:   Post-operative Plan: Extubation in OR  Informed Consent: I have reviewed the patients History and Physical, chart, labs and discussed the procedure including the risks, benefits and alternatives for the proposed anesthesia with the patient or authorized representative who has indicated his/her understanding and acceptance.   Dental advisory given  Plan Discussed with: CRNA, Anesthesiologist and Surgeon  Anesthesia Plan Comments:         Anesthesia Quick Evaluation

## 2016-03-17 NOTE — Anesthesia Procedure Notes (Signed)
Procedure Name: LMA Insertion Performed by: Terrance Mass Pre-anesthesia Checklist: Patient identified, Emergency Drugs available, Suction available and Patient being monitored Patient Re-evaluated:Patient Re-evaluated prior to inductionOxygen Delivery Method: Circle System Utilized Preoxygenation: Pre-oxygenation with 100% oxygen Intubation Type: IV induction Ventilation: Mask ventilation without difficulty LMA: LMA inserted LMA Size: 5.0 Number of attempts: 1 Placement Confirmation: positive ETCO2 Tube secured with: Tape Dental Injury: Teeth and Oropharynx as per pre-operative assessment

## 2016-03-17 NOTE — Op Note (Signed)
Pre-Op Dx: Left knee medial meniscal tear, lateral meniscal tear, chondromalacia, Baker's cyst with one-way valve  Postop Dx: Same   Procedure: Left knee arthroscopic partial medial and lateral meniscectomies debridement chondromalacia grade 3 medial femoral condyle grade 4 lateral facet of patella and lateral trochlea and femoral condyle with abrasion arthroplasty. Drainage of Baker's cyst or one-way valve posterior horn of medial meniscus with cauterization of the channel.  Surgeon: Kathalene Frames. Mayer Camel M.D.  Assist: Kerry Hough. 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 Has recurrent Baker's cyst is been drained 3 times in the office. 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 Pt has failed conservative treatment with anti-inflammatory medicines, physical therapy, and modified activites but did get good temporarily from an intra-articular cortisone injection. Pain has recurred and patient desires elective arthroscopic evaluation and treatment of knee. Risks and benefits of surgery have been discussed and questions answered.  Procedure: Patient identified by arm band and taken to the operating room at the day surgery Center. The appropriate anesthetic monitors were attached, and General LMA anesthesia was induced without difficulty. Lateral post was applied to the table and the lower extremity was prepped and draped in usual sterile fashion from the ankle to the midthigh. Time out procedure was performed. We began the operation by making standard inferior lateral and inferior medial peripatellar portals with a #11 blade allowing introduction of the  arthroscope through the inferior lateral portal and the out flow to the inferior medial portal. Pump pressure was set at 100 mmHg and diagnostic arthroscopy  revealed grade 4 chondromalacia lateral facet of the patella and lateral trochlea debrider back to stable margins with 35 Gator sucker shaver and and a 4.2 gray-white sucker shaver was used to perform abrasion arthroplasty down to bleeding bone in 2 areas. Moving into the medial compartment large posterior horn medial meniscal tear debrided with a 4.2 gray-white sucker shaver large upbiter small upbiter and a 35 Gator sucker shaver this revealed a channel into the Baker's cyst which was probed and were able to drain the Baker's cyst through the channel. Moving into the notch the anterior cruciate ligament and PCL are intact on the lateral side the patient had extensive degenerative tearing of the lateral meniscus posteriorly and laterally and this was likewise debrided back to a stable margin first with a 4.2 gray-white sucker shaver and then a 3.5 mm Gator sucker shaver. The gutters were cleared medially and laterally there was grade 3 chondromalacia of the medial femoral condyle that was likewise debrided. At the end of the case the pressure was taken down a 40 mm and once again drained the Baker's cyst through a cannula. The cannulae were then removed and a local anesthetic of 1/2% Marcaine with epinephrine solution was instilled 10 mL into the joint and 5 mL into each portal.. The knee was irrigated out normal saline solution. A dressing of xerofoam 4 x 4 dressing sponges, web roll and an Ace wrap was applied. The patient was awakened extubated and taken to the recovery without difficulty.    Signed: Kerin Salen, MD

## 2016-03-17 NOTE — Discharge Instructions (Signed)

## 2016-03-17 NOTE — Interval H&P Note (Signed)
History and Physical Interval Note:  03/17/2016 9:58 AM  Kyle [name]  has presented today for surgery, with the diagnosis of left knee medial meniscal tear and chondromalacia  The various methods of treatment have been discussed with the patient and family. After consideration of risks, benefits and other options for treatment, the patient has consented to  Procedure(s): ARTHROSCOPY KNEE (Left) as a surgical intervention .  The patient's history has been reviewed, patient examined, no change in status, stable for surgery.  I have reviewed the patient's chart and labs.  Questions were answered to the patient's satisfaction.     Kerin Salen

## 2016-03-20 ENCOUNTER — Encounter (HOSPITAL_BASED_OUTPATIENT_CLINIC_OR_DEPARTMENT_OTHER): Payer: Self-pay | Admitting: Orthopedic Surgery

## 2016-03-21 DIAGNOSIS — L570 Actinic keratosis: Secondary | ICD-10-CM | POA: Diagnosis not present

## 2016-03-23 ENCOUNTER — Ambulatory Visit: Payer: Medicare Other

## 2016-03-23 ENCOUNTER — Other Ambulatory Visit: Payer: Medicare Other

## 2016-03-23 ENCOUNTER — Ambulatory Visit: Payer: Medicare Other | Admitting: Hematology & Oncology

## 2016-03-23 ENCOUNTER — Telehealth: Payer: Self-pay | Admitting: *Deleted

## 2016-03-23 ENCOUNTER — Ambulatory Visit (INDEPENDENT_AMBULATORY_CARE_PROVIDER_SITE_OTHER): Payer: 59 | Admitting: Family Medicine

## 2016-03-23 VITALS — BP 122/84 | HR 74 | Temp 97.9°F | Resp 18 | Ht 76.75 in | Wt 226.0 lb

## 2016-03-23 DIAGNOSIS — M25562 Pain in left knee: Secondary | ICD-10-CM | POA: Diagnosis not present

## 2016-03-23 DIAGNOSIS — M1712 Unilateral primary osteoarthritis, left knee: Secondary | ICD-10-CM

## 2016-03-23 DIAGNOSIS — H6123 Impacted cerumen, bilateral: Secondary | ICD-10-CM | POA: Diagnosis not present

## 2016-03-23 NOTE — Telephone Encounter (Signed)
Per Dr. Everlene Farrier mail a copy of CT abdomen and pelvis to patient. Report mailed out today.

## 2016-03-23 NOTE — Patient Instructions (Signed)
     IF you received an x-ray today, you will receive an invoice from [facility]. Please contact [facility] at [phone] with questions or concerns regarding your invoice.   IF you received labwork today, you will receive an invoice from [facility]. Please contact [facility] at [phone] with questions or concerns regarding your invoice.   Our billing staff will not be able to assist you with questions regarding bills from these companies.  You will be contacted with the lab results as soon as they are available. The fastest way to get your results is to activate your My Chart account. Instructions are located on the last page of this paperwork. If you have not heard from us regarding the results in 2 weeks, please contact this office.      

## 2016-04-06 DIAGNOSIS — M25562 Pain in left knee: Secondary | ICD-10-CM | POA: Diagnosis not present

## 2016-04-28 NOTE — Progress Notes (Signed)
Subjective:    Patient ID: Kyle [name], male    DOB: Mar 01, 1946, 70 y.o.   MRN: [id number]  03/23/2016  Cerumen Impaction   HPI This 69 y.o. male presents for evaluation of B ears clogged; requesting B ear irrigation; chronic recurrent issue for patient. Denies fever/chills/sweats; denies nasal congestion or rhinorrhea.  OA knees:  S/p knee arthroscopy. Recovering well.  Out on FMLA due to knee surgery and OA.   Review of Systems  Constitutional: Negative for fever, chills, diaphoresis and fatigue.  HENT: Positive for ear pain and hearing loss. Negative for ear discharge.   Musculoskeletal: Positive for joint swelling, arthralgias and gait problem.    Past Medical History  Diagnosis Date  . Hypertension   . Tubular adenoma 2006  . Allergy   . Arthritis   . Asthma     pt. states very mild  . Coronary artery disease     non obstructive  . GERD (gastroesophageal reflux disease)     omeprazole PRN  . Weakness of right arm   . Insomnia     ambien  . Complication of anesthesia     pt. states that neck is arthritic and can not move neck very well.   . Difficult intubation   . Hypercholesterolemia   . Glucose intolerance (pre-diabetes) dx'd 03/2015  . DJD (degenerative joint disease)     "bad" (04/27/2015)  . Basal cell carcinoma     "shoulder"  . Squamous carcinoma ([place])     "left chest/abd"  . Diabetes mellitus without complication [facility])    Past Surgical History  Procedure Laterality Date  . Total hip arthroplasty Left 2004  . Salivary gland surgery  1999    Dr Thornell Mule  . Left heart catheterization with coronary angiogram N/A 05/26/2013    Procedure: LEFT HEART CATHETERIZATION WITH CORONARY ANGIOGRAM;  Surgeon: Burnell Blanks, MD;  Location: [facility] CATH LAB;  Service: Cardiovascular;  Laterality: N/A;  . Joint replacement    . Colonoscopy      pt. states that may have had a polyp  . Total knee arthroplasty Right 04/26/2015    Procedure: RIGHT TOTAL KNEE  ARTHROPLASTY;  Surgeon: Frederik Pear, MD;  Location: [place];  Service: Orthopedics;  Laterality: Right;  RIGHT TOTAL KNEE ARTHROPLASTY  . Knee arthroscopy Right X 2  . Cardiac catheterization  2007; 2014  . Eye surgery    . Blepharoplasty Bilateral 2000's  . Basal cell carcinoma excision      "shoulder"  . Squamous cell carcinoma excision Left     "chest/abd"  . Knee arthroscopy with medial menisectomy Left 03/17/2016    Procedure: LEFT KNEE ARTHROSCOPY WITH PARTIAL MEDIAL AND LATERAL MENISECTOMY AND DEBRIDEMENT CHONDROMALACIA;  Surgeon: Frederik Pear, MD;  Location: [facility];  Service: Orthopedics;  Laterality: Left;  . Knee arthroscopy with lateral menisectomy Left 03/17/2016    Procedure: LEFT KNEE ARTHROSCOPY WITH LATERAL MENISECTOMY;  Surgeon: Frederik Pear, MD;  Location: [place];  Service: Orthopedics;  Laterality: Left;  . Knee arthroscopy with excision baker's cyst Left 03/17/2016    Procedure: LEFT KNEE ARTHROSCOPY WITH DRAINAGE OF BAKER'S CYST;  Surgeon: Frederik Pear, MD;  Location: [place];  Service: Orthopedics;  Laterality: Left;   Allergies  Allergen Reactions  . Ceftin [Cefuroxime Axetil] Anaphylaxis  . Cefuroxime Axetil Anaphylaxis and Hives  . Celecoxib Anaphylaxis and Hives  . Nsaids Anaphylaxis and Hives    Pt takes aspirin every other day  .  Other     Hand sanitizers- angioedema can tolerate CHG, alcohol prep, and betadine   . Penicillins Hives  . Sulfa Antibiotics Hives  . Adhesive [Tape] Rash  . Clindamycin/Lincomycin Rash  . Doxycycline Rash   Current Outpatient Prescriptions  Medication Sig Dispense Refill  . Albuterol Sulfate (PROAIR RESPICLICK) 123XX123 (90 Base) MCG/ACT AEPB Inhale 2 puffs into the lungs every 4 (four) hours as needed. 3 each 1  . ASMANEX 60 METERED DOSES 220 MCG/INH inhaler INHALE 2 PUFFS ONCE DAILY 3 Inhaler 1  . aspirin 81 MG tablet Take 81 mg by mouth daily.    Marland Kitchen atorvastatin (LIPITOR) 40 MG  tablet Take 40 mg by mouth daily.    . Azelastine HCl (ASTEPRO) 0.15 % SOLN Place 2 sprays into the nose daily as needed (allergies). 90 mL 3  . B Complex Vitamins (B COMPLEX PO) Take 1 tablet by mouth 3 (three) times a week.    . cetirizine (ZYRTEC) 10 MG tablet Take 10 mg by mouth daily as needed for allergies.    . clobetasol ointment (TEMOVATE) 0.05 % APPLY TOPICALLY TWICE A DAY AS NEEDED SKIN IRRITATIONS 45 g 3  . diltiazem (CARDIZEM CD) 240 MG 24 hr capsule Take 2 capsules (480 mg total) by mouth daily. 180 capsule 1  . EPINEPHrine 0.3 mg/0.3 mL IJ SOAJ injection Inject 0.3 mLs (0.3 mg total) into the muscle as needed (for allergic reactions). 2 Device 0  . fluticasone (FLONASE) 50 MCG/ACT nasal spray INSTILL 2 SPRAYS INTO NOSTRIL ONCE DAILY 48 g 2  . HYDROcodone-acetaminophen (NORCO) 5-325 MG tablet Take 1 tablet by mouth every 6 (six) hours as needed. 60 tablet 0  . metFORMIN (GLUCOPHAGE XR) 500 MG 24 hr tablet Take 2 in the morning and 1 at night 270 tablet 3  . methocarbamol (ROBAXIN) 750 MG tablet Take 1 tablet (750 mg total) by mouth every 8 (eight) hours as needed for muscle spasms. 40 tablet 0  . metoprolol succinate (TOPROL-XL) 50 MG 24 hr tablet Take 1 tablet (50 mg total) by mouth at bedtime. 90 tablet 1  . multivitamin (ONE-A-DAY MEN'S) TABS tablet Take 1 tablet by mouth 3 (three) times a week.    Marland Kitchen omeprazole (PRILOSEC) 20 MG capsule TAKE 1 CAPSULE BY MOUTH DAILY. 90 capsule 0  . sildenafil (REVATIO) 20 MG tablet Take 2 tablets 1 hour prior to intercourse. Do not take nitroglycerin 50 tablet 3  . zolpidem (AMBIEN) 10 MG tablet Take 1 tablet (10 mg total) by mouth at bedtime. 90 tablet 1   No current facility-administered medications for this visit.   Social History   Social History  . Marital Status: Married    Spouse Name: N/A  . Number of Children: N/A  . Years of Education: N/A   Occupational History  . Not on file.   Social History Main Topics  . Smoking status:  Never Smoker   . Smokeless tobacco: Former Systems developer    Types: Chew     Comment: "chewed in my baseball years"  . Alcohol Use: 1.8 oz/week    1 Glasses of wine, 1 Cans of beer, 1 Shots of liquor per week  . Drug Use: No  . Sexual Activity: Yes   Other Topics Concern  . Not on file   Social History Narrative   Family History  Problem Relation Age of Onset  . Coronary artery disease    . Heart disease Mother   . Heart disease Father   . Coronary  artery disease Sister        Objective:    BP 122/84 mmHg  Pulse 74  Temp(Src) 97.9 F (36.6 C) (Oral)  Resp 18  Ht 6' 4.75" (1.949 m)  Wt 226 lb (102.513 kg)  BMI 26.99 kg/m2  SpO2 97% Physical Exam  Constitutional: He appears well-developed and well-nourished. No distress.  HENT:  Head: Normocephalic and atraumatic.  Right Ear: External ear normal.  Left Ear: External ear normal.  Nose: Nose normal.  B ears with cerumen impaction.  Musculoskeletal:       Left knee: He exhibits swelling, effusion and ecchymosis.  Skin: He is not diaphoretic.   Results for orders placed or performed during the hospital encounter of 03/17/16  Glucose, capillary  Result Value Ref Range   Glucose-Capillary 111 (H) 65 - 99 mg/dL  Glucose, capillary  Result Value Ref Range   Glucose-Capillary 135 (H) 65 - 99 mg/dL       Assessment & Plan:   1. Cerumen impaction, bilateral   2. Primary osteoarthritis of left knee     Orders Placed This Encounter  Procedures  . Ear wax removal    Scheduling Instructions:     BILATERAL   No orders of the defined types were placed in this encounter.    No Follow-up on file.    Robertt Buda Elayne Guerin, M.D. [facility] [address] [place], [place]  [address] [phone] phone [phone] fax

## 2016-05-04 DIAGNOSIS — M1712 Unilateral primary osteoarthritis, left knee: Secondary | ICD-10-CM | POA: Diagnosis not present

## 2016-05-04 MED FILL — OXYCODONE/APAP 5/325MG: 5-325 | 30 days supply | Qty: 60 | Fill #0

## 2016-05-05 DIAGNOSIS — D0471 Carcinoma in situ of skin of right lower limb, including hip: Secondary | ICD-10-CM | POA: Diagnosis not present

## 2016-05-15 ENCOUNTER — Ambulatory Visit: Payer: 59 | Attending: Orthopedic Surgery | Admitting: Physical Therapy

## 2016-05-15 DIAGNOSIS — M6281 Muscle weakness (generalized): Secondary | ICD-10-CM | POA: Insufficient documentation

## 2016-05-15 DIAGNOSIS — M25562 Pain in left knee: Secondary | ICD-10-CM | POA: Diagnosis not present

## 2016-05-15 DIAGNOSIS — M25662 Stiffness of left knee, not elsewhere classified: Secondary | ICD-10-CM | POA: Diagnosis not present

## 2016-05-15 DIAGNOSIS — R269 Unspecified abnormalities of gait and mobility: Secondary | ICD-10-CM | POA: Diagnosis not present

## 2016-05-16 ENCOUNTER — Encounter: Payer: Self-pay | Admitting: Physical Therapy

## 2016-05-16 NOTE — Therapy (Addendum)
[facility] [address] [place], [place], [address] Phone: [phone]   Fax:  [phone]  Physical Therapy Evaluation  Patient Details  Name: Kyle [name] MRN: [id number] Date of Birth: Feb 18, 1946 Referring Provider: Dr. Frederik Pear  Encounter Date: 05/15/2016    Past Medical History:  Diagnosis Date  . Allergy   . Arthritis   . Asthma    pt. states very mild  . Basal cell carcinoma    "shoulder"  . Complication of anesthesia    pt. states that neck is arthritic and can not move neck very well.   . Coronary artery disease    non obstructive  . Diabetes mellitus without complication ([place])   . Difficult intubation   . DJD (degenerative joint disease)    "bad" (04/27/2015)  . GERD (gastroesophageal reflux disease)    omeprazole PRN  . Glucose intolerance (pre-diabetes) dx'd 03/2015  . Hypercholesterolemia   . Hypertension   . Insomnia    ambien  . Squamous carcinoma ([place])    "left chest/abd"  . Tubular adenoma 2006  . Weakness of right arm     Past Surgical History:  Procedure Laterality Date  . BASAL CELL CARCINOMA EXCISION     "shoulder"  . BLEPHAROPLASTY Bilateral 2000's  . CARDIAC CATHETERIZATION  2007; 2014  . COLONOSCOPY     pt. states that may have had a polyp  . EYE SURGERY    . JOINT REPLACEMENT    . KNEE ARTHROSCOPY Right X 2  . KNEE ARTHROSCOPY WITH EXCISION BAKER'S CYST Left 03/17/2016   Procedure: LEFT KNEE ARTHROSCOPY WITH DRAINAGE OF BAKER'S CYST;  Surgeon: Frederik Pear, MD;  Location: [facility];  Service: Orthopedics;  Laterality: Left;  . KNEE ARTHROSCOPY WITH LATERAL MENISECTOMY Left 03/17/2016   Procedure: LEFT KNEE ARTHROSCOPY WITH LATERAL MENISECTOMY;  Surgeon: Frederik Pear, MD;  Location: [facility];  Service: Orthopedics;  Laterality: Left;  . KNEE ARTHROSCOPY WITH MEDIAL MENISECTOMY Left 03/17/2016   Procedure: LEFT KNEE ARTHROSCOPY WITH PARTIAL MEDIAL AND  LATERAL MENISECTOMY AND DEBRIDEMENT CHONDROMALACIA;  Surgeon: Frederik Pear, MD;  Location: [facility];  Service: Orthopedics;  Laterality: Left;  . LEFT HEART CATHETERIZATION WITH CORONARY ANGIOGRAM N/A 05/26/2013   Procedure: LEFT HEART CATHETERIZATION WITH CORONARY ANGIOGRAM;  Surgeon: Burnell Blanks, MD;  Location: [facility] CATH LAB;  Service: Cardiovascular;  Laterality: N/A;  . SALIVARY GLAND SURGERY  1999   Dr Thornell Mule  . SQUAMOUS CELL CARCINOMA EXCISION Left    "chest/abd"  . TOTAL HIP ARTHROPLASTY Left 2004  . TOTAL KNEE ARTHROPLASTY Right 04/26/2015   Procedure: RIGHT TOTAL KNEE ARTHROPLASTY;  Surgeon: Frederik Pear, MD;  Location: [facility];  Service: Orthopedics;  Laterality: Right;  RIGHT TOTAL KNEE ARTHROPLASTY    There were no vitals filed for this visit.           [facility] PT Assessment - 06/26/16 0001      Assessment   Medical Diagnosis s/p L knee scope/ meniscal repair   Referring Provider Dr. Frederik Pear   Onset Date/Surgical Date 03/17/16   Next MD Visit 06/2016       Pt. reports no L knee pain currenlty and states he has been doing well since surgery.      There.ex.: SEE HEP handouts.  Discussed gym based/ aquatic ex. Program.  Scifit L4 10 min. B UE/LE (consistent cadence).  Pt. Descending stairs backwards at home and discussed step to vs. Recip. Pattern  with handrail assist.      Pt. is a pleasant 70 y/o male s/p L knee scope on 03/17/16.  Pt. has h/o multiple ortho surgeries in past due to arthritis.  Pt. reports no L knee pain currently at rest and >2/10 pain with increase standing/walking activities.  Minimal swelling in L/R lower leg: calf (35.5/34.25 cm.), mid-patella (44/43 cm.), distal quad. (46.5/44 cm.).  LEFS: 40 out of 80.  R LE muscle strength grossly 5/5 MMT except hip flexion 4+/5 MMT and L LE muscle strength grossly 4/5 MMT (minimal c/o pain with resisted knee ext.).  Pt. presents with functional L knee AROM as compared to R knee. Pt.  ambulates with slight L antalgic gait pattern with inconsistent heel strike/ step pattern and pain limitations with descending stairs with recip. pattern.  Pt. has slight hypomobility of L patella (all planes) and good stability at prox. tibia with AP/PA mobs.  Pt. will benefit from skilled PT services to increase L knee ROM/ stability to promote return to work/ ex. program with no c/o pain.              PT Long Term Goals - 05/16/16 0817      PT LONG TERM GOAL #1   Title Pt. I with HEP to increase L LE muscle strength to 5/5 MMT to improve pain-free mobility.     Baseline L LE strength grossly 4/5 MMT.     Time 4   Period Weeks   Status New     PT LONG TERM GOAL #2   Title Pt. will increase LEFS to >60 out of 80 to improve pain-free mobility/ return to work without limitations.     Baseline LEFS: 40 out of 80 on 05/15/16   Time 4   Period Weeks   Status New     PT LONG TERM GOAL #3   Title Pt. able to participate with 45 gym based/ aquatic ex. program with no increase c/o L knee pain or limitations reported.    Baseline pt. is slowly returning to aquatic ex. program.    Time 4   Period Weeks   Status New     PT LONG TERM GOAL #4   Title Pt. able to ambulate 10 min. with normalized gait pattern and consistent step pattern to improve pain-free mobility.     Baseline L antalgic gait with no device.    Time 4   Period Weeks   Status New     PT LONG TERM GOAL #5   Title Pt. able to ascends/descend 4 steps with recip. pattern and no handrails with no c/o L knee pain to improve return to PLOF.     Baseline Pain limited with descending stairs with recip. pattern.    Time 4   Period Weeks   Status New         Patient will benefit from skilled therapeutic intervention in order to improve the following deficits and impairments:  Decreased strength, Postural dysfunction, Difficulty walking, Decreased mobility, Impaired flexibility, Decreased range of motion, Decreased balance,  Pain  Visit Diagnosis: Pain in left knee  Muscle weakness (generalized)  Joint stiffness of knee, left  Gait difficulty     Problem List Patient Active Problem List   Diagnosis Date Noted  . Arthritis of knee 04/26/2015  . Primary osteoarthritis of right knee 04/25/2015  . Closed fracture of right patella 04/25/2015  . 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 Posterior tibial tendonitis 08/07/2013  . Precordial pain 05/26/2013  .  Essential hypertension 10/19/2009  . Diabetes type 2, controlled ([place]) 10/15/2009  . Hyperlipemia 10/15/2009  . CAD, NATIVE VESSEL 10/15/2009   Pura Spice, PT, DPT # [phone]   06/26/2016, 8:22 AM  [facility] [address] [place], [place], [address] Phone: [phone]   Fax:  [phone]  Name: Kyle [name] MRN: [id number] Date of Birth: [birth year]

## 2016-05-17 ENCOUNTER — Encounter: Payer: 59 | Admitting: Physical Therapy

## 2016-05-22 ENCOUNTER — Encounter: Payer: 59 | Admitting: Physical Therapy

## 2016-05-24 ENCOUNTER — Telehealth: Payer: Self-pay | Admitting: Emergency Medicine

## 2016-05-24 ENCOUNTER — Ambulatory Visit: Payer: 59 | Admitting: Physical Therapy

## 2016-05-24 DIAGNOSIS — R269 Unspecified abnormalities of gait and mobility: Secondary | ICD-10-CM | POA: Diagnosis not present

## 2016-05-24 DIAGNOSIS — M6281 Muscle weakness (generalized): Secondary | ICD-10-CM

## 2016-05-24 DIAGNOSIS — M25662 Stiffness of left knee, not elsewhere classified: Secondary | ICD-10-CM | POA: Diagnosis not present

## 2016-05-24 DIAGNOSIS — M25562 Pain in left knee: Secondary | ICD-10-CM | POA: Diagnosis not present

## 2016-05-24 NOTE — Telephone Encounter (Signed)
Patient called to see if he had had hep C testing. Also interested in whether his wife has had hep C testing and the status of their immunizations. It appears Pamala Hurry does need hep C testing. 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 He also needs hep C testing. I will call and relay this information to them.

## 2016-05-25 NOTE — Therapy (Addendum)
[facility] [address] [place], [place], [address] Phone: [phone]   Fax:  [phone]  Physical Therapy Treatment  Patient Details  Name: Kyle [name] MRN: [id number] Date of Birth: [birth year] No Data Recorded  Encounter Date: 05/24/2016      PT End of Session - 05/25/16 1529    Visit Number 2   Number of Visits 8   Date for PT Re-Evaluation 06/12/16   Authorization - Visit Number 2   Authorization - Number of Visits 10   PT Start Time 0909   PT Stop Time 1006   PT Time Calculation (min) 57 min   Activity Tolerance Patient tolerated treatment well;No increased pain   Behavior During Therapy WFL for tasks assessed/performed      Past Medical History:  Diagnosis Date  . Allergy   . Arthritis   . Asthma    pt. states very mild  . Basal cell carcinoma    "shoulder"  . Complication of anesthesia    pt. states that neck is arthritic and can not move neck very well.   . Coronary artery disease    non obstructive  . Diabetes mellitus without complication ([place])   . Difficult intubation   . DJD (degenerative joint disease)    "bad" (04/27/2015)  . GERD (gastroesophageal reflux disease)    omeprazole PRN  . Glucose intolerance (pre-diabetes) dx'd 03/2015  . Hypercholesterolemia   . Hypertension   . Insomnia    ambien  . Squamous carcinoma ([place])    "left chest/abd"  . Tubular adenoma 2006  . Weakness of right arm     Past Surgical History:  Procedure Laterality Date  . BASAL CELL CARCINOMA EXCISION     "shoulder"  . BLEPHAROPLASTY Bilateral 2000's  . CARDIAC CATHETERIZATION  2007; 2014  . COLONOSCOPY     pt. states that may have had a polyp  . EYE SURGERY    . JOINT REPLACEMENT    . KNEE ARTHROSCOPY Right X 2  . KNEE ARTHROSCOPY WITH EXCISION BAKER'S CYST Left 03/17/2016   Procedure: LEFT KNEE ARTHROSCOPY WITH DRAINAGE OF BAKER'S CYST;  Surgeon: Frederik Pear, MD;  Location: [facility];   Service: Orthopedics;  Laterality: Left;  . KNEE ARTHROSCOPY WITH LATERAL MENISECTOMY Left 03/17/2016   Procedure: LEFT KNEE ARTHROSCOPY WITH LATERAL MENISECTOMY;  Surgeon: Frederik Pear, MD;  Location: [facility];  Service: Orthopedics;  Laterality: Left;  . KNEE ARTHROSCOPY WITH MEDIAL MENISECTOMY Left 03/17/2016   Procedure: LEFT KNEE ARTHROSCOPY WITH PARTIAL MEDIAL AND LATERAL MENISECTOMY AND DEBRIDEMENT CHONDROMALACIA;  Surgeon: Frederik Pear, MD;  Location: [facility];  Service: Orthopedics;  Laterality: Left;  . LEFT HEART CATHETERIZATION WITH CORONARY ANGIOGRAM N/A 05/26/2013   Procedure: LEFT HEART CATHETERIZATION WITH CORONARY ANGIOGRAM;  Surgeon: Burnell Blanks, MD;  Location[facility] CATH LAB;  Service: Cardiovascular;  Laterality: N/A;  . SALIVARY GLAND SURGERY  1999   Dr Thornell Mule  . SQUAMOUS CELL CARCINOMA EXCISION Left    "chest/abd"  . TOTAL HIP ARTHROPLASTY Left 2004  . TOTAL KNEE ARTHROPLASTY Right 04/26/2015   Procedure: RIGHT TOTAL KNEE ARTHROPLASTY;  Surgeon: Frederik Pear, MD;  Location: [facility];  Service: Orthopedics;  Laterality: Right;  RIGHT TOTAL KNEE ARTHROPLASTY    There were no vitals filed for this visit.      Subjective Assessment - 05/25/16 1529    Subjective Pt. reports he is doing better.  Good compliance with HEP.  Limitations Standing;Walking;House hold activities;Other (comment)   Patient Stated Goals return to work/ exercise with no limitations.     Currently in Pain? No/denies       OBJECTIVE:  There.ex.: Scifit L6-7 12 min. B UE/LE (no charge/ warm-up).  3"/ 6" step ups in //-bars on L with min. To no UE assist.  Reviewed HEP.  TG knee flexion/ heel raises 20x each (cuing to maintain midline knee position/ proper patellar tracking).  Agility ladder step touches/ lateral walking with no UE assist (cuing for posture)- 9 min.  Manual tx.: supine L/R LE stretches (as tolerated)- focus on L hamstring flexibility.  Patellar mobs.  (all planes).      Pt response for medical necessity:  Pt. Benefits from LE strengthening ex./ manual tx. To increase LE flexibility and progression of gym based ex. Program.  Marked improvement in movement after there.ex./ stretches.  Slight increase c/o L knee pain to 2/10 at end of tx.        Pt. demonstrates good technique and verbalizes compliance with HEP/ quad strengthening ex. program.  Moderate hypomobility with patellar mobility and limited L distal/proximal hamstring length noted.  L knee flexion AROM 124 deg. (pain tolerable).         Plan - 05/25/16 1530    Rehab Potential Good   PT Frequency 2x / week   PT Duration 4 weeks   PT Treatment/Interventions ADLs/Self Care Home Management;Aquatic Therapy;Cryotherapy;Electrical Stimulation;Patient/family education;Neuromuscular re-education;Balance training;Therapeutic exercise;Therapeutic activities;Functional mobility training;Stair training;Gait training;Manual techniques   PT Next Visit Plan Review HEP/ gym based ex.    PT Home Exercise Plan See handouts   Consulted and Agree with Plan of Care Patient      Patient will benefit from skilled therapeutic intervention in order to improve the following deficits and impairments:  Decreased strength, Postural dysfunction, Difficulty walking, Decreased mobility, Impaired flexibility, Decreased range of motion, Decreased balance, Pain  Visit Diagnosis: Pain in left knee  Muscle weakness (generalized)  Joint stiffness of knee, left  Gait difficulty     Problem List Patient Active Problem List   Diagnosis Date Noted  . Arthritis of knee 04/26/2015  . Primary osteoarthritis of right knee 04/25/2015  . Closed fracture of right patella 04/25/2015  . 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 Posterior tibial tendonitis 08/07/2013  . Precordial pain 05/26/2013  . Essential hypertension 10/19/2009  . Diabetes type 2, controlled ([place]) 10/15/2009  . Hyperlipemia 10/15/2009  . CAD,  NATIVE VESSEL 10/15/2009   Pura Spice, PT, DPT # [id number]  05/25/2016, 3:31 PM  [facility] [address] [place], [place], [address] Phone: [phone]   Fax:  [phone]  Name: Kyle [name] MRN: [id number] Date of Birth: 1945-12-18

## 2016-05-29 ENCOUNTER — Encounter: Payer: 59 | Admitting: Physical Therapy

## 2016-05-30 ENCOUNTER — Ambulatory Visit: Payer: 59 | Attending: Orthopedic Surgery | Admitting: Physical Therapy

## 2016-05-30 DIAGNOSIS — R269 Unspecified abnormalities of gait and mobility: Secondary | ICD-10-CM | POA: Diagnosis not present

## 2016-05-30 DIAGNOSIS — M25562 Pain in left knee: Secondary | ICD-10-CM | POA: Insufficient documentation

## 2016-05-30 DIAGNOSIS — M6281 Muscle weakness (generalized): Secondary | ICD-10-CM | POA: Insufficient documentation

## 2016-05-30 DIAGNOSIS — M25662 Stiffness of left knee, not elsewhere classified: Secondary | ICD-10-CM | POA: Diagnosis not present

## 2016-05-31 ENCOUNTER — Encounter: Payer: 59 | Admitting: Physical Therapy

## 2016-05-31 NOTE — Therapy (Addendum)
[facility] [address] [place], [place], [address] Phone: [phone]   Fax:  [phone]  Physical Therapy Treatment  Patient Details  Name: Kyle [name] MRN: [id number] Date of Birth: Jan 16, 1946 No Data Recorded  Encounter Date: 05/30/2016      PT End of Session - 05/31/16 1903    Visit Number 3   Number of Visits 8   Date for PT Re-Evaluation 06/12/16   Authorization - Visit Number 3   Authorization - Number of Visits 10   PT Start Time [id number]   PT Stop Time 0950   PT Time Calculation (min) 59 min   Activity Tolerance Patient tolerated treatment well;No increased pain   Behavior During Therapy WFL for tasks assessed/performed      Past Medical History:  Diagnosis Date  . Allergy   . Arthritis   . Asthma    pt. states very mild  . Basal cell carcinoma    "shoulder"  . Complication of anesthesia    pt. states that neck is arthritic and can not move neck very well.   . Coronary artery disease    non obstructive  . Diabetes mellitus without complication ([place])   . Difficult intubation   . DJD (degenerative joint disease)    "bad" (04/27/2015)  . GERD (gastroesophageal reflux disease)    omeprazole PRN  . Glucose intolerance (pre-diabetes) dx'd 03/2015  . Hypercholesterolemia   . Hypertension   . Insomnia    ambien  . Squamous carcinoma ([place])    "left chest/abd"  . Tubular adenoma 2006  . Weakness of right arm     Past Surgical History:  Procedure Laterality Date  . BASAL CELL CARCINOMA EXCISION     "shoulder"  . BLEPHAROPLASTY Bilateral 2000's  . CARDIAC CATHETERIZATION  2007; 2014  . COLONOSCOPY     pt. states that may have had a polyp  . EYE SURGERY    . JOINT REPLACEMENT    . KNEE ARTHROSCOPY Right X 2  . KNEE ARTHROSCOPY WITH EXCISION BAKER'S CYST Left 03/17/2016   Procedure: LEFT KNEE ARTHROSCOPY WITH DRAINAGE OF BAKER'S CYST;  Surgeon: Frederik Pear, MD;  Location: [place];  Service:  Orthopedics;  Laterality: Left;  . KNEE ARTHROSCOPY WITH LATERAL MENISECTOMY Left 03/17/2016   Procedure: LEFT KNEE ARTHROSCOPY WITH LATERAL MENISECTOMY;  Surgeon: Frederik Pear, MD;  Location: [place];  Service: Orthopedics;  Laterality: Left;  . KNEE ARTHROSCOPY WITH MEDIAL MENISECTOMY Left 03/17/2016   Procedure: LEFT KNEE ARTHROSCOPY WITH PARTIAL MEDIAL AND LATERAL MENISECTOMY AND DEBRIDEMENT CHONDROMALACIA;  Surgeon: Frederik Pear, MD;  Location: [place];  Service: Orthopedics;  Laterality: Left;  . LEFT HEART CATHETERIZATION WITH CORONARY ANGIOGRAM N/A 05/26/2013   Procedure: LEFT HEART CATHETERIZATION WITH CORONARY ANGIOGRAM;  Surgeon: Burnell Blanks, MD;  Location: [facility] CATH LAB;  Service: Cardiovascular;  Laterality: N/A;  . SALIVARY GLAND SURGERY  1999   Dr Thornell Mule  . SQUAMOUS CELL CARCINOMA EXCISION Left    "chest/abd"  . TOTAL HIP ARTHROPLASTY Left 2004  . TOTAL KNEE ARTHROPLASTY Right 04/26/2015   Procedure: RIGHT TOTAL KNEE ARTHROPLASTY;  Surgeon: Frederik Pear, MD;  Location: [place];  Service: Orthopedics;  Laterality: Right;  RIGHT TOTAL KNEE ARTHROPLASTY    There were no vitals filed for this visit.      Subjective Assessment - 05/31/16 1901    Subjective Pt. states he has remained active with gym based ex. program.  Pt.  states he rode bike for 40+ minutes and was sore in knee yesterday.  No c/o pain at this time.     Patient is accompained by: Family member   Limitations Standing;Walking;House hold activities;Other (comment)   Patient Stated Goals return to work/ exercise with no limitations.     Currently in Pain? No/denies       OBJECTIVE:  There.ex.: Scifit L7 15 min. B UE/LE (no charge/ warm-up).  Partial lunges/ walking lunges in //-bars (pain tolerable range) 10x with cuing for upright posture.  BOSU step ups (forward/ lateral) with min. To no UE assist 15x each.  Nautilus: walk outs 80# forward and backwards 10x each (no UE assist).   TG knee flexion/ heel raises 20x each (cuing to maintain midline knee position/ proper patellar tracking).  Walking in clinic/ outside.   Manual tx.: supine L/R LE stretches (as tolerated)- focus on L hamstring flexibility.  Patellar mobs. (all planes).  No increase c/o L knee pain t/o tx. Session.                            Pt response for medical necessity:  Pt. Benefits from LE strengthening ex./ manual tx. To increase LE flexibility and progression of gym based ex. Program.  Marked improvement in movement after there.ex./ stretches.  L hamstring muscle tightness noted.       Pt. ambulates with more symmetrical arm swing/ heel strike while walking around gym/ outside with no c/o knee pain.  R ankle/ foot limitations alter pt. ability to independently step up on BOSU/ Airex safely.  Pt. progressing with L hamstring flexibility to improve knee ROM/ standing posture.  R knee ext. remains limited (chronic issues since TKA).  L knee flexion 128 deg. after manual stretches/ patellar mobs.          Plan - 05/31/16 1904    Rehab Potential Good   PT Frequency 2x / week   PT Duration 4 weeks   PT Treatment/Interventions ADLs/Self Care Home Management;Aquatic Therapy;Cryotherapy;Electrical Stimulation;Patient/family education;Neuromuscular re-education;Balance training;Therapeutic exercise;Therapeutic activities;Functional mobility training;Stair training;Gait training;Manual techniques   PT Next Visit Plan Progress LE strengthening.    PT Home Exercise Plan See handouts   Consulted and Agree with Plan of Care Patient      Patient will benefit from skilled therapeutic intervention in order to improve the following deficits and impairments:  Decreased strength, Postural dysfunction, Difficulty walking, Decreased mobility, Impaired flexibility, Decreased range of motion, Decreased balance, Pain  Visit Diagnosis: Pain in left knee  Muscle weakness (generalized)  Joint stiffness of knee,  left  Gait difficulty     Problem List Patient Active Problem List   Diagnosis Date Noted  . Arthritis of knee 04/26/2015  . Primary osteoarthritis of right knee 04/25/2015  . Closed fracture of right patella 04/25/2015  . 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 Posterior tibial tendonitis 08/07/2013  . Precordial pain 05/26/2013  . Essential hypertension 10/19/2009  . Diabetes type 2, controlled ([place]) 10/15/2009  . Hyperlipemia 10/15/2009  . CAD, NATIVE VESSEL 10/15/2009   Pura Spice, PT, DPT # [phone]  05/31/2016, 7:06 PM  [facility] [address] [place], [place], [address] Phone: [phone]   Fax:  [phone]  Name: Kyle [name] MRN: [id number] Date of Birth: 02-28-1946

## 2016-06-05 ENCOUNTER — Encounter: Payer: 59 | Admitting: Physical Therapy

## 2016-06-06 ENCOUNTER — Encounter: Payer: 59 | Admitting: Physical Therapy

## 2016-06-07 ENCOUNTER — Encounter: Payer: 59 | Admitting: Physical Therapy

## 2016-06-12 ENCOUNTER — Encounter: Payer: Self-pay | Admitting: Physical Therapy

## 2016-06-12 ENCOUNTER — Ambulatory Visit: Payer: 59 | Admitting: Physical Therapy

## 2016-06-12 DIAGNOSIS — M25662 Stiffness of left knee, not elsewhere classified: Secondary | ICD-10-CM

## 2016-06-12 DIAGNOSIS — M6281 Muscle weakness (generalized): Secondary | ICD-10-CM | POA: Diagnosis not present

## 2016-06-12 DIAGNOSIS — R269 Unspecified abnormalities of gait and mobility: Secondary | ICD-10-CM | POA: Diagnosis not present

## 2016-06-12 DIAGNOSIS — M25562 Pain in left knee: Secondary | ICD-10-CM

## 2016-06-13 NOTE — Therapy (Addendum)
[facility] [address] [place], [place], [address] Phone: [phone]   Fax:  [phone]  Physical Therapy Treatment  Patient Details  Name: Kyle [name] MRN: [id number] Date of Birth: 11/14/1945 No Data Recorded  Encounter Date: 06/12/2016      PT End of Session - 06/13/16 1617    Visit Number 4   Number of Visits 8   Date for PT Re-Evaluation 06/12/16   Authorization - Visit Number 4   Authorization - Number of Visits 10   PT Start Time 0906   PT Stop Time 1003   PT Time Calculation (min) 57 min   Activity Tolerance Patient tolerated treatment well;No increased pain   Behavior During Therapy WFL for tasks assessed/performed      Past Medical History:  Diagnosis Date  . Allergy   . Arthritis   . Asthma    pt. states very mild  . Basal cell carcinoma    "shoulder"  . Complication of anesthesia    pt. states that neck is arthritic and can not move neck very well.   . Coronary artery disease    non obstructive  . Diabetes mellitus without complication ([place])   . Difficult intubation   . DJD (degenerative joint disease)    "bad" (04/27/2015)  . GERD (gastroesophageal reflux disease)    omeprazole PRN  . Glucose intolerance (pre-diabetes) dx'd 03/2015  . Hypercholesterolemia   . Hypertension   . Insomnia    ambien  . Squamous carcinoma ([place])    "left chest/abd"  . Tubular adenoma 2006  . Weakness of right arm     Past Surgical History:  Procedure Laterality Date  . BASAL CELL CARCINOMA EXCISION     "shoulder"  . BLEPHAROPLASTY Bilateral 2000's  . CARDIAC CATHETERIZATION  2007; 2014  . COLONOSCOPY     pt. states that may have had a polyp  . EYE SURGERY    . JOINT REPLACEMENT    . KNEE ARTHROSCOPY Right X 2  . KNEE ARTHROSCOPY WITH EXCISION BAKER'S CYST Left 03/17/2016   Procedure: LEFT KNEE ARTHROSCOPY WITH DRAINAGE OF BAKER'S CYST;  Surgeon: Frederik Pear, MD;  Location: [place];   Service: Orthopedics;  Laterality: Left;  . KNEE ARTHROSCOPY WITH LATERAL MENISECTOMY Left 03/17/2016   Procedure: LEFT KNEE ARTHROSCOPY WITH LATERAL MENISECTOMY;  Surgeon: Frederik Pear, MD;  Location: [place];  Service: Orthopedics;  Laterality: Left;  . KNEE ARTHROSCOPY WITH MEDIAL MENISECTOMY Left 03/17/2016   Procedure: LEFT KNEE ARTHROSCOPY WITH PARTIAL MEDIAL AND LATERAL MENISECTOMY AND DEBRIDEMENT CHONDROMALACIA;  Surgeon: Frederik Pear, MD;  Location: [place];  Service: Orthopedics;  Laterality: Left;  . LEFT HEART CATHETERIZATION WITH CORONARY ANGIOGRAM N/A 05/26/2013   Procedure: LEFT HEART CATHETERIZATION WITH CORONARY ANGIOGRAM;  Surgeon: Burnell Blanks, MD;  Location: [facility] CATH LAB;  Service: Cardiovascular;  Laterality: N/A;  . SALIVARY GLAND SURGERY  1999   Dr Thornell Mule  . SQUAMOUS CELL CARCINOMA EXCISION Left    "chest/abd"  . TOTAL HIP ARTHROPLASTY Left 2004  . TOTAL KNEE ARTHROPLASTY Right 04/26/2015   Procedure: RIGHT TOTAL KNEE ARTHROPLASTY;  Surgeon: Frederik Pear, MD;  Location: [place];  Service: Orthopedics;  Laterality: Right;  RIGHT TOTAL KNEE ARTHROPLASTY    There were no vitals filed for this visit.      Subjective Assessment - 06/12/16 0858    Limitations (P)  Standing;Walking;House hold activities;Other (comment)   Patient Stated Goals (P)  return to work/ exercise with no limitations.     Currently in Pain? (P)  Yes   Pain Score (P)  3    Pain Location (P)  Knee   Pain Orientation (P)  Left      Pt. states he went to pool 2x since last tx. session and continues to ride stationary bike for increase muscle endurance.  Pt. states he remains stiff in L knee early in morning and benefits from stretching/ walking to improve pain-free mobility.  Pt. states he still requires pain meds at least 1x/day.      OBJECTIVE:  There.ex.: Scifit L8 15 min. B UE/LE (no charge/ warm-up)- consistent cadence/ no c/o pain.  BOSU step ups (forward/  lateral) with min. To no UE assist 20x each/ Reverse BOSU wt. Shifting and partial squats 10x2.  Nautilus: walk outs 95# forward and backwards 10x each (no UE assist).  TG knee flexion/ heel raises 20x each (cuing to maintain midline knee position).  Supine L hip/knee manual isometrics 10x (mod.-max. Resistance).  Discussed HEP progression.   Manual tx.: supine L/R LE stretches (as tolerated)- 11 min.  Patellar mobs. (all planes).  No increase c/o L knee pain t/o tx. Session.                            Pt response for medical necessity:  Pt. Benefits from LE strengthening ex./ manual tx. To increase LE flexibility and progression of gym based ex. Program.  Marked improvement in movement after there.ex./ stretches.  Decrease joint line swelling noted in L knee.      Pt. reports only 1/10 L knee pain after PT tx. session (marked decrease in pain with stretches/ ther.ex. in pain tolerable range).  Pt. presents with 128 deg. L knee flexion AROM after tx. session in supine position.  Increase patellar mobility noted during manual tx. in supine (sup./inf. directions with knee flexion/ ext., respectively).  No swelling in L knee noted but tenderness around posterior aspects of joint (Baker's cyst).           Plan - 06/13/16 1618    Rehab Potential Good   PT Frequency 2x / week   PT Duration 4 weeks   PT Treatment/Interventions ADLs/Self Care Home Management;Aquatic Therapy;Cryotherapy;Electrical Stimulation;Patient/family education;Neuromuscular re-education;Balance training;Therapeutic exercise;Therapeutic activities;Functional mobility training;Stair training;Gait training;Manual techniques   PT Next Visit Plan Progress LE strengthening.  Reassess goals.    PT Home Exercise Plan See handouts   Consulted and Agree with Plan of Care Patient      Patient will benefit from skilled therapeutic intervention in order to improve the following deficits and impairments:  Decreased strength, Postural  dysfunction, Difficulty walking, Decreased mobility, Impaired flexibility, Decreased range of motion, Decreased balance, Pain  Visit Diagnosis: Pain in left knee  Muscle weakness (generalized)  Joint stiffness of knee, left  Gait difficulty     Problem List Patient Active Problem List   Diagnosis Date Noted  . Arthritis of knee 04/26/2015  . Primary osteoarthritis of right knee 04/25/2015  . Closed fracture of right patella 04/25/2015  . 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 Posterior tibial tendonitis 08/07/2013  . Precordial pain 05/26/2013  . Essential hypertension 10/19/2009  . Diabetes type 2, controlled ([place]) 10/15/2009  . Hyperlipemia 10/15/2009  . CAD, NATIVE VESSEL 10/15/2009   Pura Spice, PT, DPT # [id number]  06/13/2016, 4:19 PM  [facility] [address]  [place], [place], [address] Phone: [phone]   Fax:  [phone]  Name: Kyle [name] MRN: [id number] Date of Birth: 1946/04/26

## 2016-06-14 ENCOUNTER — Ambulatory Visit: Payer: 59 | Admitting: Physical Therapy

## 2016-06-16 DIAGNOSIS — R7309 Other abnormal glucose: Secondary | ICD-10-CM | POA: Diagnosis not present

## 2016-06-16 DIAGNOSIS — R898 Other abnormal findings in specimens from other organs, systems and tissues: Secondary | ICD-10-CM | POA: Diagnosis not present

## 2016-06-16 DIAGNOSIS — I10 Essential (primary) hypertension: Secondary | ICD-10-CM | POA: Diagnosis not present

## 2016-06-16 DIAGNOSIS — R319 Hematuria, unspecified: Secondary | ICD-10-CM | POA: Diagnosis not present

## 2016-06-16 DIAGNOSIS — Z23 Encounter for immunization: Secondary | ICD-10-CM | POA: Diagnosis not present

## 2016-06-20 ENCOUNTER — Ambulatory Visit: Payer: 59 | Admitting: Physical Therapy

## 2016-06-20 DIAGNOSIS — R269 Unspecified abnormalities of gait and mobility: Secondary | ICD-10-CM | POA: Diagnosis not present

## 2016-06-20 DIAGNOSIS — M25662 Stiffness of left knee, not elsewhere classified: Secondary | ICD-10-CM | POA: Diagnosis not present

## 2016-06-20 DIAGNOSIS — M25562 Pain in left knee: Secondary | ICD-10-CM | POA: Diagnosis not present

## 2016-06-20 DIAGNOSIS — M6281 Muscle weakness (generalized): Secondary | ICD-10-CM | POA: Diagnosis not present

## 2016-06-21 MED FILL — ATORVASTATIN 40 MG TABLET: 40 | 90 days supply | Qty: 90 | Fill #0

## 2016-06-21 MED FILL — DILTIAZEM ER 240 MG TABLET: 240 | 90 days supply | Qty: 90 | Fill #0

## 2016-06-21 MED FILL — METFORMIN HCL ER 500 MG TAB: 500 | 90 days supply | Qty: 270 | Fill #0

## 2016-06-21 MED FILL — raNITIdine HCL 300 MG TABS: 300 | 90 days supply | Qty: 45 | Fill #0

## 2016-06-21 MED FILL — METOPROLOL SUCC ER 50 MG TA: 50 | 90 days supply | Qty: 90 | Fill #0

## 2016-06-21 MED FILL — ZOLPIDEM TARTRATE 10 MG TAB: 10 | 90 days supply | Qty: 90 | Fill #0

## 2016-06-21 NOTE — Therapy (Addendum)
[facility] [address] [place], [place], [address] Phone: [phone]   Fax:  [phone]  Physical Therapy Treatment  Patient Details  Name: CEDRICK PARTAIN MRN: [id number] Date of Birth: [birth year] Referring Provider: Dr. Frederik Pear  Encounter Date: 06/20/2016    Past Medical History:  Diagnosis Date  . Allergy   . Arthritis   . Asthma    pt. states very mild  . Basal cell carcinoma    "shoulder"  . Complication of anesthesia    pt. states that neck is arthritic and can not move neck very well.   . Coronary artery disease    non obstructive  . Diabetes mellitus without complication ([place])   . Difficult intubation   . DJD (degenerative joint disease)    "bad" (04/27/2015)  . GERD (gastroesophageal reflux disease)    omeprazole PRN  . Glucose intolerance (pre-diabetes) dx'd 03/2015  . Hypercholesterolemia   . Hypertension   . Insomnia    ambien  . Squamous carcinoma ([place])    "left chest/abd"  . Tubular adenoma 2006  . Weakness of right arm     Past Surgical History:  Procedure Laterality Date  . BASAL CELL CARCINOMA EXCISION     "shoulder"  . BLEPHAROPLASTY Bilateral 2000's  . CARDIAC CATHETERIZATION  2007; 2014  . COLONOSCOPY     pt. states that may have had a polyp  . EYE SURGERY    . JOINT REPLACEMENT    . KNEE ARTHROSCOPY Right X 2  . KNEE ARTHROSCOPY WITH EXCISION BAKER'S CYST Left 03/17/2016   Procedure: LEFT KNEE ARTHROSCOPY WITH DRAINAGE OF BAKER'S CYST;  Surgeon: Frederik Pear, MD;  Location: [place];  Service: Orthopedics;  Laterality: Left;  . KNEE ARTHROSCOPY WITH LATERAL MENISECTOMY Left 03/17/2016   Procedure: LEFT KNEE ARTHROSCOPY WITH LATERAL MENISECTOMY;  Surgeon: Frederik Pear, MD;  Location: [place];  Service: Orthopedics;  Laterality: Left;  . KNEE ARTHROSCOPY WITH MEDIAL MENISECTOMY Left 03/17/2016   Procedure: LEFT KNEE ARTHROSCOPY WITH PARTIAL MEDIAL AND  LATERAL MENISECTOMY AND DEBRIDEMENT CHONDROMALACIA;  Surgeon: Frederik Pear, MD;  Location: [place];  Service: Orthopedics;  Laterality: Left;  . LEFT HEART CATHETERIZATION WITH CORONARY ANGIOGRAM N/A 05/26/2013   Procedure: LEFT HEART CATHETERIZATION WITH CORONARY ANGIOGRAM;  Surgeon: Burnell Blanks, MD;  Location: [facility] CATH LAB;  Service: Cardiovascular;  Laterality: N/A;  . SALIVARY GLAND SURGERY  1999   Dr Thornell Mule  . SQUAMOUS CELL CARCINOMA EXCISION Left    "chest/abd"  . TOTAL HIP ARTHROPLASTY Left 2004  . TOTAL KNEE ARTHROPLASTY Right 04/26/2015   Procedure: RIGHT TOTAL KNEE ARTHROPLASTY;  Surgeon: Frederik Pear, MD;  Location: [place];  Service: Orthopedics;  Laterality: Right;  RIGHT TOTAL KNEE ARTHROPLASTY    There were no vitals filed for this visit.    Pt. states he was stiff in L knee getting out of bed this morning but entered PT with no c/o pain and more normalized gait pattern.  Pt. reports ankle/foot remains stiff with gastroc ex.      OBJECTIVE: There.ex.: Scifit L8-9 15 min. B UE/LE (no charge/ warm-up)- consistent cadence/ no c/o pain. BOSU step ups (forward/ lateral) with min. To no UE assist 20x each/ Reverse BOSU wt. Shifting and partial squats 10x2.  Nautilus: walk outs 110# forward and backwards 10x each (no UE assist)/ 95# lateral L/R 10x each (slow and controlled movement patterns). TG knee flexion/ heel raises 20x each (midline/ toe  in/ toe out).  Supine L hip/knee manual isometrics 10x (mod.-max. Resistance).  High marching/ alt. UE and LE touches/ partial lunges/ tandem gait.  Rebounder ball toss with NBOS/ tandem/ SLS/ Airex.   Manual tx.: supine L/R LE stretches (as tolerated)- 5 min. Patellar mobs. (all planes).  No increase c/o L knee pain t/o tx. Session.     Pt response for medical necessity: Pt. Benefits from LE strengthening ex./ manual tx. To increase LE flexibility and progression of gym based ex. Program.  Marked improvement in movement after there.ex./ stretches. Decrease joint line swelling noted in L knee.     Pt. has consistently shown marked increase in L knee flexion AROM 129 deg. (no increase pain).  L quad/hamstring strength grossly 4+/5 MMT (no pain).  Increase medial/lateral/sup./inf. patellar mobility in supine position noted during manual tx.  Pt. has slight antalgic gait pattern with limited R knee ext. (due to previous TKA) with more consistent step pattern/ heel strike.  Pt. has symmetrical armswing with more upright posture with less cuing required.  Pt. will continue to benefit from skilled PT services 1x/week with independent gym based focus to increase LE strength and promote return to work with no limitations/ pain.          PT Long Term Goals - 06/21/16 1634      PT LONG TERM GOAL #1   Title Pt. I with HEP to increase L LE muscle strength to 5/5 MMT to improve pain-free mobility.     Baseline L LE strength grossly 4+/5 MMT.     Time 4   Period Weeks   Status Partially Met     PT LONG TERM GOAL #2   Title Pt. will increase LEFS to >60 out of 80 to improve pain-free mobility/ return to work without limitations.     Baseline LEFS: 40 out of 80 on 05/15/16   Time 4   Period Weeks   Status On-going     PT LONG TERM GOAL #3   Title Pt. able to participate with 45 gym based/ aquatic ex. program with no increase c/o L knee pain or limitations reported.    Baseline Pt. participating with gym based and aquatic ex. with minimal c/o L knee issues.    Time 4   Period Weeks   Status Partially Met     PT LONG TERM GOAL #4   Title Pt. able to ambulate 10 min. with normalized gait pattern and consistent step pattern to improve pain-free mobility.     Baseline Slight antalgic gait (improved).    Time 4   Period Weeks   Status Partially Met     PT LONG TERM GOAL #5   Title Pt. able to ascends/descend 4 steps with recip. pattern and no handrails with no c/o L knee pain to  improve return to PLOF.     Baseline Pt. occasionally descends stairs with backwards/step to pattern (pt. feels it is more efficient/ safer).     Time 4   Period Weeks   Status Partially Met       Patient will benefit from skilled therapeutic intervention in order to improve the following deficits and impairments:  Decreased strength, Postural dysfunction, Difficulty walking, Decreased mobility, Impaired flexibility, Decreased range of motion, Decreased balance, Pain  Visit Diagnosis: Pain in left knee  Muscle weakness (generalized)  Joint stiffness of knee, left  Gait difficulty     Problem List Patient Active Problem List   Diagnosis Date Noted  .  Arthritis of knee 04/26/2015  . Primary osteoarthritis of right knee 04/25/2015  . Closed fracture of right patella 04/25/2015  . 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 Posterior tibial tendonitis 08/07/2013  . Precordial pain 05/26/2013  . Essential hypertension 10/19/2009  . Diabetes type 2, controlled ([place]) 10/15/2009  . Hyperlipemia 10/15/2009  . CAD, NATIVE VESSEL 10/15/2009   Pura Spice, PT, DPT # [phone] 06/21/16, 7:45AM  [facility] [address] [place], [place], [address] Phone: [phone]   Fax:  [phone]  Name: DHAIRYA CORALES MRN: [id number] Date of Birth: 10/30/1946

## 2016-06-26 ENCOUNTER — Encounter: Payer: Self-pay | Admitting: Physical Therapy

## 2016-06-26 ENCOUNTER — Ambulatory Visit: Payer: 59 | Admitting: Physical Therapy

## 2016-06-26 DIAGNOSIS — M25662 Stiffness of left knee, not elsewhere classified: Secondary | ICD-10-CM

## 2016-06-26 DIAGNOSIS — R269 Unspecified abnormalities of gait and mobility: Secondary | ICD-10-CM

## 2016-06-26 DIAGNOSIS — M6281 Muscle weakness (generalized): Secondary | ICD-10-CM | POA: Diagnosis not present

## 2016-06-26 DIAGNOSIS — M25562 Pain in left knee: Secondary | ICD-10-CM

## 2016-06-26 NOTE — Addendum Note (Signed)
Addended by: Pura Spice on: 06/26/2016 08:28 AM   Modules accepted: Orders

## 2016-06-27 NOTE — Therapy (Addendum)
[facility] [address]. [place], [place], [address] Phone: [phone]   Fax:  [phone]  Physical Therapy Treatment  Patient Details  Name: Kyle [name] MRN: [id number] Date of Birth: [birth year] Referring Provider: Dr. Frederik Pear  Encounter Date: 06/26/2016      PT End of Session - 06/27/16 1759    Visit Number 6   Number of Visits 8   Date for PT Re-Evaluation 07/18/16   Authorization - Visit Number 6   Authorization - Number of Visits 14   PT Start Time 0902   PT Stop Time 1287   PT Time Calculation (min) 56 min   Activity Tolerance Patient tolerated treatment well;No increased pain   Behavior During Therapy WFL for tasks assessed/performed      Past Medical History:  Diagnosis Date  . Allergy   . Arthritis   . Asthma    pt. states very mild  . Basal cell carcinoma    "shoulder"  . Complication of anesthesia    pt. states that neck is arthritic and can not move neck very well.   . Coronary artery disease    non obstructive  . Diabetes mellitus without complication ([place])   . Difficult intubation   . DJD (degenerative joint disease)    "bad" (04/27/2015)  . GERD (gastroesophageal reflux disease)    omeprazole PRN  . Glucose intolerance (pre-diabetes) dx'd 03/2015  . Hypercholesterolemia   . Hypertension   . Insomnia    ambien  . Squamous carcinoma ([place])    "left chest/abd"  . Tubular adenoma 2006  . Weakness of right arm     Past Surgical History:  Procedure Laterality Date  . BASAL CELL CARCINOMA EXCISION     "shoulder"  . BLEPHAROPLASTY Bilateral 2000's  . CARDIAC CATHETERIZATION  2007; 2014  . COLONOSCOPY     pt. states that may have had a polyp  . EYE SURGERY    . JOINT REPLACEMENT    . KNEE ARTHROSCOPY Right X 2  . KNEE ARTHROSCOPY WITH EXCISION BAKER'S CYST Left 03/17/2016   Procedure: LEFT KNEE ARTHROSCOPY WITH DRAINAGE OF BAKER'S CYST;  Surgeon: Frederik Pear, MD;  Location: [place];  Service: Orthopedics;  Laterality: Left;  . KNEE ARTHROSCOPY WITH LATERAL MENISECTOMY Left 03/17/2016   Procedure: LEFT KNEE ARTHROSCOPY WITH LATERAL MENISECTOMY;  Surgeon: Frederik Pear, MD;  Location: [place];  Service: Orthopedics;  Laterality: Left;  . KNEE ARTHROSCOPY WITH MEDIAL MENISECTOMY Left 03/17/2016   Procedure: LEFT KNEE ARTHROSCOPY WITH PARTIAL MEDIAL AND LATERAL MENISECTOMY AND DEBRIDEMENT CHONDROMALACIA;  Surgeon: Frederik Pear, MD;  Location: [place];  Service: Orthopedics;  Laterality: Left;  . LEFT HEART CATHETERIZATION WITH CORONARY ANGIOGRAM N/A 05/26/2013   Procedure: LEFT HEART CATHETERIZATION WITH CORONARY ANGIOGRAM;  Surgeon: Burnell Blanks, MD;  Location: [facility] CATH LAB;  Service: Cardiovascular;  Laterality: N/A;  . SALIVARY GLAND SURGERY  1999   Dr Thornell Mule  . SQUAMOUS CELL CARCINOMA EXCISION Left    "chest/abd"  . TOTAL HIP ARTHROPLASTY Left 2004  . TOTAL KNEE ARTHROPLASTY Right 04/26/2015   Procedure: RIGHT TOTAL KNEE ARTHROPLASTY;  Surgeon: Frederik Pear, MD;  Location: [place];  Service: Orthopedics;  Laterality: Right;  RIGHT TOTAL KNEE ARTHROPLASTY    There were no vitals filed for this visit.      Subjective Assessment - 06/27/16 1755    Subjective Pt. states he had slight L knee discomfort with kicking motion  during swimming the other day at pool.  Pt. remains compliant with walking/ exercise program.     Patient is accompained by: Family member   Limitations Standing;Walking;House hold activities;Other (comment)   Patient Stated Goals return to work/ exercise with no limitations.     Currently in Pain? No/denies       OBJECTIVE: There.ex.: Scifit L915 min. B UE/LE (no charge/ warm-up)- no knee pain reported. BOSU step ups (forward/ lateral) 20x each/ Reverse BOSU wt. Shifting and partial squats 20x.  6" step overs forward/ lateral. TG knee flexion/ heel raises 30x each (midline/ toe in/ toe out)/  unilateral partial squats 10x each (difficulty noted).  Walking in clinic with variations in hip/knee flexion around gym and hallway. Supine L hip/knee manual isometrics 10x (mod.-max. Resistance).    Supine L/R LE stretches (as tolerated)- 5 min. Discussed swimming pattern/ kicking.    Pt response for medical necessity: Pt. Benefits from LE strengthening ex./ manual tx. To increase LE flexibility and progression of gym based ex. Program. Marked improvement in movement after there.ex./ stretches.     Pt. continues to progress with L LE stability ex. program with no increase c/o pain.  Marked increase in LE flexibility with standing stretches and stability with higher level strengthening program.  PT encouraged to to make time for a consistent gym based/ aquatic ex. program.  Pt. ambulates with improved gait pattern as tx. progresses with less antalgic gait pattern.  No swelling noted around knee joint.          PT Long Term Goals - 06/21/16 1634      PT LONG TERM GOAL #1   Title Pt. I with HEP to increase L LE muscle strength to 5/5 MMT to improve pain-free mobility.     Baseline L LE strength grossly 4+/5 MMT.     Time 4   Period Weeks   Status Partially Met     PT LONG TERM GOAL #2   Title Pt. will increase LEFS to >60 out of 80 to improve pain-free mobility/ return to work without limitations.     Baseline LEFS: 40 out of 80 on 05/15/16   Time 4   Period Weeks   Status On-going     PT LONG TERM GOAL #3   Title Pt. able to participate with 45 gym based/ aquatic ex. program with no increase c/o L knee pain or limitations reported.    Baseline Pt. participating with gym based and aquatic ex. with minimal c/o L knee issues.    Time 4   Period Weeks   Status Partially Met     PT LONG TERM GOAL #4   Title Pt. able to ambulate 10 min. with normalized gait pattern and consistent step pattern to improve pain-free mobility.     Baseline Slight antalgic gait  (improved).    Time 4   Period Weeks   Status Partially Met     PT LONG TERM GOAL #5   Title Pt. able to ascends/descend 4 steps with recip. pattern and no handrails with no c/o L knee pain to improve return to PLOF.     Baseline Pt. occasionally descends stairs with backwards/step to pattern (pt. feels it is more efficient/ safer).     Time 4   Period Weeks   Status Partially Met            Plan - 06/27/16 1759    Rehab Potential Good   PT Frequency 1x / week  PT Treatment/Interventions ADLs/Self Care Home Management;Aquatic Therapy;Cryotherapy;Electrical Stimulation;Patient/family education;Neuromuscular re-education;Balance training;Therapeutic exercise;Therapeutic activities;Functional mobility training;Stair training;Gait training;Manual techniques   PT Next Visit Plan Progression of gym based and aquatic ex. program.     PT Home Exercise Plan See handouts   Consulted and Agree with Plan of Care Patient      Patient will benefit from skilled therapeutic intervention in order to improve the following deficits and impairments:  Decreased strength, Postural dysfunction, Difficulty walking, Decreased mobility, Impaired flexibility, Decreased range of motion, Decreased balance, Pain  Visit Diagnosis: Pain in left knee  Muscle weakness (generalized)  Joint stiffness of knee, left  Gait difficulty     Problem List Patient Active Problem List   Diagnosis Date Noted  . Arthritis of knee 04/26/2015  . Primary osteoarthritis of right knee 04/25/2015  . Closed fracture of right patella 04/25/2015  . 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 Posterior tibial tendonitis 08/07/2013  . Precordial pain 05/26/2013  . Essential hypertension 10/19/2009  . Diabetes type 2, controlled ([place]) 10/15/2009  . Hyperlipemia 10/15/2009  . CAD, NATIVE VESSEL 10/15/2009   Pura Spice, PT, DPT # [id number]  06/27/2016, 6:01 PM  [facility] [address] [place], [place], [address] Phone: [phone]   Fax:  [phone]  Name: Kyle [name] MRN: [id number] Date of Birth: 1946-09-11

## 2016-07-04 ENCOUNTER — Ambulatory Visit: Payer: 59 | Attending: Orthopedic Surgery | Admitting: Physical Therapy

## 2016-07-04 ENCOUNTER — Encounter: Payer: Self-pay | Admitting: Physical Therapy

## 2016-07-04 DIAGNOSIS — M25662 Stiffness of left knee, not elsewhere classified: Secondary | ICD-10-CM | POA: Diagnosis not present

## 2016-07-04 DIAGNOSIS — R269 Unspecified abnormalities of gait and mobility: Secondary | ICD-10-CM | POA: Diagnosis not present

## 2016-07-04 DIAGNOSIS — M25562 Pain in left knee: Secondary | ICD-10-CM | POA: Diagnosis not present

## 2016-07-04 DIAGNOSIS — M6281 Muscle weakness (generalized): Secondary | ICD-10-CM | POA: Insufficient documentation

## 2016-07-04 NOTE — Addendum Note (Signed)
Addended by: Pura Spice on: 07/04/2016 07:58 AM   Modules accepted: Orders

## 2016-07-04 NOTE — Therapy (Signed)
[facility] [address] [place], [place], [address] Phone: [phone]   Fax:  [phone]  Physical Therapy Treatment  Patient Details  Name: Kyle [name] MRN: [id number] Date of Birth: Jan 02, 1946 Referring Provider: Dr. Frederik Pear  Encounter Date: 07/04/2016      PT End of Session - 07/04/16 0859    Visit Number 7   Number of Visits 8   Date for PT Re-Evaluation 07/18/16   Authorization - Visit Number 7   Authorization - Number of Visits 14   PT Start Time 0932   PT Stop Time 7494   PT Time Calculation (min) 73 min   Activity Tolerance Patient tolerated treatment well;No increased pain   Behavior During Therapy WFL for tasks assessed/performed      Past Medical History:  Diagnosis Date  . Allergy   . Arthritis   . Asthma    pt. states very mild  . Basal cell carcinoma    "shoulder"  . Complication of anesthesia    pt. states that neck is arthritic and can not move neck very well.   . Coronary artery disease    non obstructive  . Diabetes mellitus without complication ([place])   . Difficult intubation   . DJD (degenerative joint disease)    "bad" (04/27/2015)  . GERD (gastroesophageal reflux disease)    omeprazole PRN  . Glucose intolerance (pre-diabetes) dx'd 03/2015  . Hypercholesterolemia   . Hypertension   . Insomnia    ambien  . Squamous carcinoma ([place])    "left chest/abd"  . Tubular adenoma 2006  . Weakness of right arm     Past Surgical History:  Procedure Laterality Date  . BASAL CELL CARCINOMA EXCISION     "shoulder"  . BLEPHAROPLASTY Bilateral 2000's  . CARDIAC CATHETERIZATION  2007; 2014  . COLONOSCOPY     pt. states that may have had a polyp  . EYE SURGERY    . JOINT REPLACEMENT    . KNEE ARTHROSCOPY Right X 2  . KNEE ARTHROSCOPY WITH EXCISION BAKER'S CYST Left 03/17/2016   Procedure: LEFT KNEE ARTHROSCOPY WITH DRAINAGE OF BAKER'S CYST;  Surgeon: Frederik Pear, MD;  Location: [place];  Service: Orthopedics;  Laterality: Left;  . KNEE ARTHROSCOPY WITH LATERAL MENISECTOMY Left 03/17/2016   Procedure: LEFT KNEE ARTHROSCOPY WITH LATERAL MENISECTOMY;  Surgeon: Frederik Pear, MD;  Location: [place];  Service: Orthopedics;  Laterality: Left;  . KNEE ARTHROSCOPY WITH MEDIAL MENISECTOMY Left 03/17/2016   Procedure: LEFT KNEE ARTHROSCOPY WITH PARTIAL MEDIAL AND LATERAL MENISECTOMY AND DEBRIDEMENT CHONDROMALACIA;  Surgeon: Frederik Pear, MD;  Location: [place];  Service: Orthopedics;  Laterality: Left;  . LEFT HEART CATHETERIZATION WITH CORONARY ANGIOGRAM N/A 05/26/2013   Procedure: LEFT HEART CATHETERIZATION WITH CORONARY ANGIOGRAM;  Surgeon: Burnell Blanks, MD;  Location[facility] CATH LAB;  Service: Cardiovascular;  Laterality: N/A;  . SALIVARY GLAND SURGERY  1999   Dr Thornell Mule  . SQUAMOUS CELL CARCINOMA EXCISION Left    "chest/abd"  . TOTAL HIP ARTHROPLASTY Left 2004  . TOTAL KNEE ARTHROPLASTY Right 04/26/2015   Procedure: RIGHT TOTAL KNEE ARTHROPLASTY;  Surgeon: Frederik Pear, MD;  Location: [place];  Service: Orthopedics;  Laterality: Right;  RIGHT TOTAL KNEE ARTHROPLASTY    There were no vitals filed for this visit.      Subjective Assessment - 07/04/16 0859    Subjective Pt. reports aching in B knees this morning but not painful.  Pt. states B LE feel heavy/ weak at end of day.  Pt. reports losing 2-3 pounds of fluid over night and feels great with swimming.     Patient is accompained by: Family member   Limitations Standing;Walking;House hold activities;Other (comment)   Patient Stated Goals return to work/ exercise with no limitations.     Currently in Pain? No/denies      OBJECTIVE: There.ex.: Scifit L1015 min. B UE/LE (no charge/ warm-up)- consistent cadence.  High marching in hallway/ braiding/ tandem gait/ alt. UE and LE touches/ walking with increase cadence in hallway/ sled push and pull 70# (45 feet x 4 with each).  Standing hamstring stretches 2x L and R with increase static holds.  BOSU step ups (forward/ lateral) 20x each.  6" step overs forward/ lateral. TG knee flexion/ heel raises 30x each (midline/ toe in/ toe out)/ unilateral partial squats 30x each (difficulty noted).  Walking in clinic with variations in hip/knee flexion around gym and hallway. Nautilus: resisted walking 95# 20x each f/b.  Scifit L10 10 min. (end of tx.).     Pt response for medical necessity: Pt. Benefits from LE strengthening ex./ manual tx. To increase LE flexibility and progression of gym based ex. Program. Marked improvement in movement after there.ex./ stretches.        PT Long Term Goals - 06/21/16 1634      PT LONG TERM GOAL #1   Title Pt. I with HEP to increase L LE muscle strength to 5/5 MMT to improve pain-free mobility.     Baseline L LE strength grossly 4+/5 MMT.     Time 4   Period Weeks   Status Partially Met     PT LONG TERM GOAL #2   Title Pt. will increase LEFS to >60 out of 80 to improve pain-free mobility/ return to work without limitations.     Baseline LEFS: 40 out of 80 on 05/15/16   Time 4   Period Weeks   Status On-going     PT LONG TERM GOAL #3   Title Pt. able to participate with 45 gym based/ aquatic ex. program with no increase c/o L knee pain or limitations reported.    Baseline Pt. participating with gym based and aquatic ex. with minimal c/o L knee issues.    Time 4   Period Weeks   Status Partially Met     PT LONG TERM GOAL #4   Title Pt. able to ambulate 10 min. with normalized gait pattern and consistent step pattern to improve pain-free mobility.     Baseline Slight antalgic gait (improved).    Time 4   Period Weeks   Status Partially Met     PT LONG TERM GOAL #5   Title Pt. able to ascends/descend 4 steps with recip. pattern and no handrails with no c/o L knee pain to improve return to PLOF.     Baseline Pt. occasionally descends stairs with  backwards/step to pattern (pt. feels it is more efficient/ safer).     Time 4   Period Weeks   Status Partially Met           Plan - 07/04/16 0859    Clinical Impression Statement Increase overall muscle endurance/ LE strengthening focus today with addition of Scifit 2x during tx. session.  Good patellar tracking with bilateral/ unilateral knee squats.  Cuing to correct upright posture/ positioning with sled and resisted walking tasks.  Pt. will continue with PT 1x/week with focus on gym  based/ aquatic ex. program.     Rehab Potential Good   PT Frequency 1x / week   PT Duration 4 weeks   PT Treatment/Interventions ADLs/Self Care Home Management;Aquatic Therapy;Cryotherapy;Electrical Stimulation;Patient/family education;Neuromuscular re-education;Balance training;Therapeutic exercise;Therapeutic activities;Functional mobility training;Stair training;Gait training;Manual techniques   PT Next Visit Plan Progression of gym based and aquatic ex. program.  Discuss Rheumatologist appt.     PT Home Exercise Plan See handouts   Consulted and Agree with Plan of Care Patient      Patient will benefit from skilled therapeutic intervention in order to improve the following deficits and impairments:  Decreased strength, Postural dysfunction, Difficulty walking, Decreased mobility, Impaired flexibility, Decreased range of motion, Decreased balance, Pain  Visit Diagnosis: Pain in left knee  Muscle weakness (generalized)  Joint stiffness of knee, left  Gait difficulty     Problem List Patient Active Problem List   Diagnosis Date Noted  . Arthritis of knee 04/26/2015  . Primary osteoarthritis of right knee 04/25/2015  . Closed fracture of right patella 04/25/2015  . 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 Posterior tibial tendonitis 08/07/2013  . Precordial pain 05/26/2013  . Essential hypertension 10/19/2009  . Diabetes type 2, controlled ([place]) 10/15/2009  . Hyperlipemia 10/15/2009  . CAD,  NATIVE VESSEL 10/15/2009   Pura Spice, PT, DPT # [id number] 07/04/2016, 1:13 PM  [facility] [address] [place], [place], [address] Phone: [phone]   Fax:  [phone]  Name: Kyle [name] MRN: [id number] Date of Birth: 03/04/1946

## 2016-07-06 ENCOUNTER — Other Ambulatory Visit: Payer: Self-pay | Admitting: Emergency Medicine

## 2016-07-08 ENCOUNTER — Other Ambulatory Visit: Payer: Self-pay | Admitting: Emergency Medicine

## 2016-07-08 MED ORDER — EPINEPHRINE 0.3 MG/0.3ML IJ SOAJ: 0.3000 mg | INTRAMUSCULAR | 1 refills | Status: AC | PRN

## 2016-07-11 ENCOUNTER — Encounter: Payer: 59 | Admitting: Physical Therapy

## 2016-07-13 ENCOUNTER — Ambulatory Visit: Payer: 59 | Admitting: Physical Therapy

## 2016-07-13 ENCOUNTER — Encounter: Payer: Self-pay | Admitting: Physical Therapy

## 2016-07-13 DIAGNOSIS — M25662 Stiffness of left knee, not elsewhere classified: Secondary | ICD-10-CM

## 2016-07-13 DIAGNOSIS — M25562 Pain in left knee: Secondary | ICD-10-CM

## 2016-07-13 DIAGNOSIS — R269 Unspecified abnormalities of gait and mobility: Secondary | ICD-10-CM | POA: Diagnosis not present

## 2016-07-13 DIAGNOSIS — M6281 Muscle weakness (generalized): Secondary | ICD-10-CM

## 2016-07-13 NOTE — Therapy (Signed)
[facility] [address] [place], [place], [address] Phone: [phone]   Fax:  [phone]  Physical Therapy Treatment  Patient Details  Name: COLLIE WERNICK MRN: [id number] Date of Birth: 09-03-1946 Referring Provider: Dr. Frederik Pear  Encounter Date: 07/13/2016      PT End of Session - 07/13/16 0901    Visit Number 8   Number of Visits 8   Date for PT Re-Evaluation 07/18/16   Authorization - Visit Number 8   Authorization - Number of Visits 14   PT Start Time 0856   PT Stop Time 1010   PT Time Calculation (min) 74 min   Activity Tolerance Patient tolerated treatment well;No increased pain   Behavior During Therapy WFL for tasks assessed/performed      Past Medical History:  Diagnosis Date  . Allergy   . Arthritis   . Asthma    pt. states very mild  . Basal cell carcinoma    "shoulder"  . Complication of anesthesia    pt. states that neck is arthritic and can not move neck very well.   . Coronary artery disease    non obstructive  . Diabetes mellitus without complication ([place])   . Difficult intubation   . DJD (degenerative joint disease)    "bad" (04/27/2015)  . GERD (gastroesophageal reflux disease)    omeprazole PRN  . Glucose intolerance (pre-diabetes) dx'd 03/2015  . Hypercholesterolemia   . Hypertension   . Insomnia    ambien  . Squamous carcinoma ([place])    "left chest/abd"  . Tubular adenoma 2006  . Weakness of right arm     Past Surgical History:  Procedure Laterality Date  . BASAL CELL CARCINOMA EXCISION     "shoulder"  . BLEPHAROPLASTY Bilateral 2000's  . CARDIAC CATHETERIZATION  2007; 2014  . COLONOSCOPY     pt. states that may have had a polyp  . EYE SURGERY    . JOINT REPLACEMENT    . KNEE ARTHROSCOPY Right X 2  . KNEE ARTHROSCOPY WITH EXCISION BAKER'S CYST Left 03/17/2016   Procedure: LEFT KNEE ARTHROSCOPY WITH DRAINAGE OF BAKER'S CYST;  Surgeon: Frederik Pear, MD;  Location: [place];  Service: Orthopedics;  Laterality: Left;  . KNEE ARTHROSCOPY WITH LATERAL MENISECTOMY Left 03/17/2016   Procedure: LEFT KNEE ARTHROSCOPY WITH LATERAL MENISECTOMY;  Surgeon: Frederik Pear, MD;  Location: [place];  Service: Orthopedics;  Laterality: Left;  . KNEE ARTHROSCOPY WITH MEDIAL MENISECTOMY Left 03/17/2016   Procedure: LEFT KNEE ARTHROSCOPY WITH PARTIAL MEDIAL AND LATERAL MENISECTOMY AND DEBRIDEMENT CHONDROMALACIA;  Surgeon: Frederik Pear, MD;  Location: [place];  Service: Orthopedics;  Laterality: Left;  . LEFT HEART CATHETERIZATION WITH CORONARY ANGIOGRAM N/A 05/26/2013   Procedure: LEFT HEART CATHETERIZATION WITH CORONARY ANGIOGRAM;  Surgeon: Burnell Blanks, MD;  Location: [facility] CATH LAB;  Service: Cardiovascular;  Laterality: N/A;  . SALIVARY GLAND SURGERY  1999   Dr Thornell Mule  . SQUAMOUS CELL CARCINOMA EXCISION Left    "chest/abd"  . TOTAL HIP ARTHROPLASTY Left 2004  . TOTAL KNEE ARTHROPLASTY Right 04/26/2015   Procedure: RIGHT TOTAL KNEE ARTHROPLASTY;  Surgeon: Frederik Pear, MD;  Location: [place];  Service: Orthopedics;  Laterality: Right;  RIGHT TOTAL KNEE ARTHROPLASTY    There were no vitals filed for this visit.      Subjective Assessment - 07/13/16 0900    Subjective Pt. c/o 3/10 L knee pain currently at rest.  Pt.  states he had to go to [place] this week for a funeral.  Pt. states he is unable to walk a mile.     Patient is accompained by: Family member   Limitations Standing;Walking;House hold activities;Other (comment)   Patient Stated Goals return to work/ exercise with no limitations.     Currently in Pain? Yes   Pain Score 3    Pain Location Knee   Pain Orientation Left   Pain Descriptors / Indicators Tightness     LEFS: 30 out of 80.    OBJECTIVE: There.ex.: Scifit L1015 min. B UE/LE (no charge/ warm-up)- consistent cadence.  BOSU step ups (forward/ lateral) 20x each. BOSU lunges forward 20x (cuing for increase  hold time/ good knee alignment).  High marching forward and backwards/ braiding/ tandem gait/ alt. UE and LE touches/ SLS >20 sec. goal/ sled push and pull 70# (45 feet x 4 with each). Supine L hip isometrics 10x with max. manual resistance. Walking in clinic with variations in hip/knee flexion around gym and hallway.  Scifit L10 15 min. (end of tx.)- cool down.     Pt response for medical necessity: Pt. Benefits from LE strengthening ex./ manual tx. To increase LE flexibility and progression of gym based ex. Program. Marked improvement in movement after there.ex./ stretches.  Pts. Have 3 more tx. Sessions prior to MD f/u visit and determination of RTW.          PT Long Term Goals - 06/21/16 1634      PT LONG TERM GOAL #1   Title Pt. I with HEP to increase L LE muscle strength to 5/5 MMT to improve pain-free mobility.     Baseline L LE strength grossly 4+/5 MMT.     Time 4   Period Weeks   Status Partially Met     PT LONG TERM GOAL #2   Title Pt. will increase LEFS to >60 out of 80 to improve pain-free mobility/ return to work without limitations.     Baseline LEFS: 40 out of 80 on 05/15/16   Time 4   Period Weeks   Status On-going     PT LONG TERM GOAL #3   Title Pt. able to participate with 45 gym based/ aquatic ex. program with no increase c/o L knee pain or limitations reported.    Baseline Pt. participating with gym based and aquatic ex. with minimal c/o L knee issues.    Time 4   Period Weeks   Status Partially Met     PT LONG TERM GOAL #4   Title Pt. able to ambulate 10 min. with normalized gait pattern and consistent step pattern to improve pain-free mobility.     Baseline Slight antalgic gait (improved).    Time 4   Period Weeks   Status Partially Met     PT LONG TERM GOAL #5   Title Pt. able to ascends/descend 4 steps with recip. pattern and no handrails with no c/o L knee pain to improve return to PLOF.     Baseline Pt. occasionally  descends stairs with backwards/step to pattern (pt. feels it is more efficient/ safer).     Time 4   Period Weeks   Status Partially Met           Plan - 07/13/16 0903    Clinical Impression Statement Pt. rescheduled Rheumatologist appt. for October 23rd.  Pt. has ortho f/u with Dr. Mayer Camel on 10/5.  Pt. continues to show slow but consistent progress with  normalized gait pattern/ B LE muscle strengthening.  Pt. able to complete SLS for >20 sec. on L/R LE after several attempts withtout UE assist.  Good patellar tracking and B LE quad muscle strength grossly 5/5 MMT.     Rehab Potential Good   PT Frequency 1x / week   PT Duration 4 weeks   PT Treatment/Interventions ADLs/Self Care Home Management;Aquatic Therapy;Cryotherapy;Electrical Stimulation;Patient/family education;Neuromuscular re-education;Balance training;Therapeutic exercise;Therapeutic activities;Functional mobility training;Stair training;Gait training;Manual techniques   PT Next Visit Plan Progression of gym based and aquatic ex. program.  RECERT/ REASSESS GOALS   PT Home Exercise Plan See handouts   Consulted and Agree with Plan of Care Patient      Patient will benefit from skilled therapeutic intervention in order to improve the following deficits and impairments:  Decreased strength, Postural dysfunction, Difficulty walking, Decreased mobility, Impaired flexibility, Decreased range of motion, Decreased balance, Pain  Visit Diagnosis: Pain in left knee  Muscle weakness (generalized)  Joint stiffness of knee, left  Gait difficulty     Problem List Patient Active Problem List   Diagnosis Date Noted  . Arthritis of knee 04/26/2015  . Primary osteoarthritis of right knee 04/25/2015  . Closed fracture of right patella 04/25/2015  . 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 Posterior tibial tendonitis 08/07/2013  . Precordial pain 05/26/2013  . Essential hypertension 10/19/2009  . Diabetes type 2, controlled ([place])  10/15/2009  . Hyperlipemia 10/15/2009  . CAD, NATIVE VESSEL 10/15/2009   Pura Spice, PT, DPT # [id number] 07/13/2016, 1:29 PM  [facility] [address] [place], [place], [address] Phone: [phone]   Fax:  [phone]  Name: LEODIS ALCOCER MRN: [id number] Date of Birth: 1946-05-31

## 2016-07-14 DIAGNOSIS — M19012 Primary osteoarthritis, left shoulder: Secondary | ICD-10-CM | POA: Diagnosis not present

## 2016-07-18 ENCOUNTER — Encounter: Payer: Self-pay | Admitting: Physical Therapy

## 2016-07-18 ENCOUNTER — Ambulatory Visit: Payer: 59 | Admitting: Physical Therapy

## 2016-07-18 DIAGNOSIS — M6281 Muscle weakness (generalized): Secondary | ICD-10-CM

## 2016-07-18 DIAGNOSIS — M25562 Pain in left knee: Secondary | ICD-10-CM | POA: Diagnosis not present

## 2016-07-18 DIAGNOSIS — R269 Unspecified abnormalities of gait and mobility: Secondary | ICD-10-CM | POA: Diagnosis not present

## 2016-07-18 DIAGNOSIS — M25662 Stiffness of left knee, not elsewhere classified: Secondary | ICD-10-CM

## 2016-07-19 NOTE — Therapy (Signed)
[facility] [address] [place], [place], [address] Phone: [phone]   Fax:  [phone]  Physical Therapy Treatment  Patient Details  Name: Kyle [name] MRN: [id number] Date of Birth: Jun 30, 1946 Referring Provider: Dr. Frederik Pear  Encounter Date: 07/18/2016      PT End of Session - 07/19/16 1615    Visit Number 9   Number of Visits 16   Date for PT Re-Evaluation 08/15/16   Authorization - Visit Number 9   Authorization - Number of Visits 18   PT Start Time 0901   PT Stop Time 1005   PT Time Calculation (min) 64 min   Activity Tolerance Patient tolerated treatment well;No increased pain   Behavior During Therapy WFL for tasks assessed/performed      Past Medical History:  Diagnosis Date  . Allergy   . Arthritis   . Asthma    pt. states very mild  . Basal cell carcinoma    "shoulder"  . Complication of anesthesia    pt. states that neck is arthritic and can not move neck very well.   . Coronary artery disease    non obstructive  . Diabetes mellitus without complication ([place])   . Difficult intubation   . DJD (degenerative joint disease)    "bad" (04/27/2015)  . GERD (gastroesophageal reflux disease)    omeprazole PRN  . Glucose intolerance (pre-diabetes) dx'd 03/2015  . Hypercholesterolemia   . Hypertension   . Insomnia    ambien  . Squamous carcinoma ([employer])    "left chest/abd"  . Tubular adenoma 2006  . Weakness of right arm     Past Surgical History:  Procedure Laterality Date  . BASAL CELL CARCINOMA EXCISION     "shoulder"  . BLEPHAROPLASTY Bilateral 2000's  . CARDIAC CATHETERIZATION  2007; 2014  . COLONOSCOPY     pt. states that may have had a polyp  . EYE SURGERY    . JOINT REPLACEMENT    . KNEE ARTHROSCOPY Right X 2  . KNEE ARTHROSCOPY WITH EXCISION BAKER'S CYST Left 03/17/2016   Procedure: LEFT KNEE ARTHROSCOPY WITH DRAINAGE OF BAKER'S CYST;  Surgeon: Frederik Pear, MD;  Location: [place];  Service: Orthopedics;  Laterality: Left;  . KNEE ARTHROSCOPY WITH LATERAL MENISECTOMY Left 03/17/2016   Procedure: LEFT KNEE ARTHROSCOPY WITH LATERAL MENISECTOMY;  Surgeon: Frederik Pear, MD;  Location: [place];  Service: Orthopedics;  Laterality: Left;  . KNEE ARTHROSCOPY WITH MEDIAL MENISECTOMY Left 03/17/2016   Procedure: LEFT KNEE ARTHROSCOPY WITH PARTIAL MEDIAL AND LATERAL MENISECTOMY AND DEBRIDEMENT CHONDROMALACIA;  Surgeon: Frederik Pear, MD;  Location: [place];  Service: Orthopedics;  Laterality: Left;  . LEFT HEART CATHETERIZATION WITH CORONARY ANGIOGRAM N/A 05/26/2013   Procedure: LEFT HEART CATHETERIZATION WITH CORONARY ANGIOGRAM;  Surgeon: Burnell Blanks, MD;  Location: [facility] CATH LAB;  Service: Cardiovascular;  Laterality: N/A;  . SALIVARY GLAND SURGERY  1999   Dr Thornell Mule  . SQUAMOUS CELL CARCINOMA EXCISION Left    "chest/abd"  . TOTAL HIP ARTHROPLASTY Left 2004  . TOTAL KNEE ARTHROPLASTY Right 04/26/2015   Procedure: RIGHT TOTAL KNEE ARTHROPLASTY;  Surgeon: Frederik Pear, MD;  Location: [place];  Service: Orthopedics;  Laterality: Right;  RIGHT TOTAL KNEE ARTHROPLASTY    There were no vitals filed for this visit.      Subjective Assessment - 07/19/16 1609    Subjective Pt. states he has really benefited from PT as compared to  his own gym based ex. program.  Pt. is interested in trying PT 2x/week over next 1-2 weeks prior to MD f/u visit.      Patient is accompained by: Family member   Limitations Standing;Walking;House hold activities;Other (comment)   Patient Stated Goals return to work/ exercise with no limitations.     Currently in Pain? Yes   Pain Score 3    Pain Location Knee   Pain Orientation Left   Pain Descriptors / Indicators Tightness   Pain Type Chronic pain;Surgical pain      OBJECTIVE: There.ex.: Scifit L1020 min. B UE/LE (no charge/ warm-up)- consistent cadence. BOSU lunges forward 20x (cuing for increase  hold time/ good knee alignment). High marching forward and backwards/ braiding/ tandem gait/ toe and heel walking/ increase step pattern with upright posture.  Resisted gait 2BTB 10x all 4-planes with no UE assist.Supine L hip isometrics 10x with max. manual resistance. TG knee flexion 30x/ unilateral 10x2 each/ heel raises 20x each.  Reviewed gym based/ HEP.    Pt response for medical necessity: Pt. Benefits from LE strengthening ex./ manual tx. To increase LE flexibility and progression of gym based ex. Program. Marked improvement in movement after there.ex./ stretches.         PT Long Term Goals - 07/19/16 1617      PT LONG TERM GOAL #1   Title Pt. I with HEP to increase L LE muscle strength to 5/5 MMT to improve pain-free mobility.     Baseline L LE strength grossly 5/5 MMT except L hip flexion 4+/5 (no pain with resisted L knee ext.).     Time 4   Period Weeks   Status Partially Met     PT LONG TERM GOAL #2   Title Pt. will increase LEFS to >60 out of 80 to improve pain-free mobility/ return to work without limitations.     Baseline LEFS: 30 out of 80 on 07/13/16  (regression in funcitonal outcome score)   Time 4   Period Weeks   Status Not Met     PT LONG TERM GOAL #3   Title Pt. able to participate with 45 gym based/ aquatic ex. program with no increase c/o L knee pain or limitations reported.    Baseline Pt. participating with gym based and aquatic ex. with minimal to no c/o L knee issues.    Time 4   Period Weeks   Status Achieved     PT LONG TERM GOAL #4   Title Pt. able to ambulate 10 min. with normalized gait pattern and consistent step pattern to improve pain-free mobility.     Baseline Slight antalgic gait (improved).    Time 4   Period Weeks   Status Partially Met     PT LONG TERM GOAL #5   Title Pt. able to ascends/descend 4 steps with recip. pattern and no handrails with no c/o L knee pain to improve return to PLOF.     Baseline  Pt. occasionally descends stairs with backwards/step to pattern (pt. feels it is more efficient/ safer).     Time 4   Period Weeks   Status Partially Met     Additional Long Term Goals   Additional Long Term Goals Yes     PT LONG TERM GOAL #6   Title Pt. able to return to work with no L knee pain/limitations safely.    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 has shown consistent progress with L knee ROM (flexion 131 deg.)- no pain and strengthening 5/5 MMT.  L hip flexor strength remains 4+/5 MMT due to hip discomfort.  Pt. ambulats with more consistent gait pattern and increase distances prior to c/o LE muscle fatigue/ discomfort.  Pt. continues to report knee stiffness/ fatigue as day progresses and with prolonged sitting.  Pt. is concerned with his ability to return to work with no limitations due to difficulty tolerating standing/walking for a 12 hour day.  Pt. will continue to benefit from PT services to maximize LE muscle strength/ endurance with pain-free mobility.     Rehab Potential Good   PT Frequency 2x / week   PT Duration 4 weeks   PT Treatment/Interventions ADLs/Self Care Home Management;Aquatic Therapy;Cryotherapy;Electrical Stimulation;Patient/family education;Neuromuscular re-education;Balance training;Therapeutic exercise;Therapeutic activities;Functional mobility training;Stair training;Gait training;Manual techniques   PT Next Visit Plan Progress quad/LE stability with higher level balance and endurance tasks.  MD f/u 10/5   PT Home Exercise Plan See handouts   Consulted and Agree with Plan of Care Patient      Patient will benefit from skilled therapeutic intervention in order to improve the following deficits and impairments:  Decreased strength, Postural dysfunction, Difficulty walking, Decreased mobility, Impaired flexibility, Decreased range of motion,  Decreased balance, Pain  Visit Diagnosis: Pain in left knee  Muscle weakness (generalized)  Joint stiffness of knee, left  Gait difficulty       G-Codes - 2016-07-27 1620    Functional Assessment Tool Used LEFS/ muscle weakness/ gait difficulty/ pain   Functional Limitation Mobility: Walking and moving around   Mobility: Walking and Moving Around Current Status (L2957) At least 1 percent but less than 20 percent impaired, limited or restricted   Mobility: Walking and Moving Around Goal Status (M7340) 0 percent impaired, limited or restricted      Problem List Patient Active Problem List   Diagnosis Date Noted  . Arthritis of knee 04/26/2015  . Primary osteoarthritis of right knee 04/25/2015  . Closed fracture of right patella 04/25/2015  . 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 Posterior tibial tendonitis 08/07/2013  . Precordial pain 05/26/2013  . Essential hypertension 10/19/2009  . Diabetes type 2, controlled ([place]) 10/15/2009  . Hyperlipemia 10/15/2009  . CAD, NATIVE VESSEL 10/15/2009   Pura Spice, PT, DPT # [id number] 07/19/2016, 4:27 PM  [facility] [address] [place], [place], [address] Phone: [phone]   Fax:  [phone]  Name: Kyle [name] MRN: [id number] Date of Birth: 1946/04/20

## 2016-07-20 ENCOUNTER — Encounter: Payer: Self-pay | Admitting: Physical Therapy

## 2016-07-20 ENCOUNTER — Ambulatory Visit: Payer: 59 | Admitting: Physical Therapy

## 2016-07-20 DIAGNOSIS — M6281 Muscle weakness (generalized): Secondary | ICD-10-CM

## 2016-07-20 DIAGNOSIS — M25662 Stiffness of left knee, not elsewhere classified: Secondary | ICD-10-CM | POA: Diagnosis not present

## 2016-07-20 DIAGNOSIS — R269 Unspecified abnormalities of gait and mobility: Secondary | ICD-10-CM

## 2016-07-20 DIAGNOSIS — M25562 Pain in left knee: Secondary | ICD-10-CM

## 2016-07-20 NOTE — Therapy (Addendum)
[employer] [facility] [address] [place], [place], [address] Phone: [phone]   Fax:  [phone]  Physical Therapy Treatment  Patient Details  Name: Kyle [name] MRN: [id number] Date of Birth: Sep 12, 1946 Referring Provider: Dr. Frederik Pear  Encounter Date: 07/20/2016      PT End of Session - 07/20/16 0757    Visit Number 10   Number of Visits 16   Date for PT Re-Evaluation 08/15/16   Authorization - Visit Number 10   Authorization - Number of Visits 18   PT Start Time 1694   Activity Tolerance Patient tolerated treatment well;No increased pain   Behavior During Therapy WFL for tasks assessed/performed      Past Medical History:  Diagnosis Date  . Allergy   . Arthritis   . Asthma    pt. states very mild  . Basal cell carcinoma    "shoulder"  . Complication of anesthesia    pt. states that neck is arthritic and can not move neck very well.   . Coronary artery disease    non obstructive  . Diabetes mellitus without complication ([place])   . Difficult intubation   . DJD (degenerative joint disease)    "bad" (04/27/2015)  . GERD (gastroesophageal reflux disease)    omeprazole PRN  . Glucose intolerance (pre-diabetes) dx'd 03/2015  . Hypercholesterolemia   . Hypertension   . Insomnia    ambien  . Squamous carcinoma ([place])    "left chest/abd"  . Tubular adenoma 2006  . Weakness of right arm     Past Surgical History:  Procedure Laterality Date  . BASAL CELL CARCINOMA EXCISION     "shoulder"  . BLEPHAROPLASTY Bilateral 2000's  . CARDIAC CATHETERIZATION  2007; 2014  . COLONOSCOPY     pt. states that may have had a polyp  . EYE SURGERY    . JOINT REPLACEMENT    . KNEE ARTHROSCOPY Right X 2  . KNEE ARTHROSCOPY WITH EXCISION BAKER'S CYST Left 03/17/2016   Procedure: LEFT KNEE ARTHROSCOPY WITH DRAINAGE OF BAKER'S CYST;  Surgeon: Frederik Pear, MD;  Location: [place];  Service: Orthopedics;  Laterality: Left;   . KNEE ARTHROSCOPY WITH LATERAL MENISECTOMY Left 03/17/2016   Procedure: LEFT KNEE ARTHROSCOPY WITH LATERAL MENISECTOMY;  Surgeon: Frederik Pear, MD;  Location: [place];  Service: Orthopedics;  Laterality: Left;  . KNEE ARTHROSCOPY WITH MEDIAL MENISECTOMY Left 03/17/2016   Procedure: LEFT KNEE ARTHROSCOPY WITH PARTIAL MEDIAL AND LATERAL MENISECTOMY AND DEBRIDEMENT CHONDROMALACIA;  Surgeon: Frederik Pear, MD;  Location: [place];  Service: Orthopedics;  Laterality: Left;  . LEFT HEART CATHETERIZATION WITH CORONARY ANGIOGRAM N/A 05/26/2013   Procedure: LEFT HEART CATHETERIZATION WITH CORONARY ANGIOGRAM;  Surgeon: Burnell Blanks, MD;  Location: [facility] CATH LAB;  Service: Cardiovascular;  Laterality: N/A;  . SALIVARY GLAND SURGERY  1999   Dr Thornell Mule  . SQUAMOUS CELL CARCINOMA EXCISION Left    "chest/abd"  . TOTAL HIP ARTHROPLASTY Left 2004  . TOTAL KNEE ARTHROPLASTY Right 04/26/2015   Procedure: RIGHT TOTAL KNEE ARTHROPLASTY;  Surgeon: Frederik Pear, MD;  Location: [place];  Service: Orthopedics;  Laterality: Right;  RIGHT TOTAL KNEE ARTHROPLASTY    There were no vitals filed for this visit.      Subjective Assessment - 07/20/16 0757    Limitations Standing;Walking;House hold activities;Other (comment)   Patient Stated Goals return to work/ exercise with no limitations.        No new  complaints.  B knee pain 2/10  OBJECTIVE: There.ex.: Scifit L1020 min. B UE/LE (no charge/ warm-up)- consistent cadence. BOSU lungesforward 20x (cuing for increase hold time/ good knee alignment). High marching forward and backwards/ braiding/ tandem gait/ toe and heel walking/ increase step pattern with upright posture.  Resisted gait 2BTB 10x all 4-planes with no UE assist.Supine L hip isometrics 10x with max. manual resistance.TG knee flexion 30x/ unilateral 10x2 each/ heel raises 20x each.  Step ups/ focus on eccentric quad control with step downs 10x (at stairs/ min. UE  assist for balance as needed).    Pt response for medical necessity: Pt. Benefits from LE strengthening ex./ manual tx. To increase LE flexibility and progression of gym based ex. Program. Marked improvement in movement after there.ex./ stretches.   Pt. Has continued to show slow but consistent progress with L quad/ LE muscle strengthening.  Persistent R medial knee pain.        PT Long Term Goals - 07/19/16 1617      PT LONG TERM GOAL #1   Title Pt. I with HEP to increase L LE muscle strength to 5/5 MMT to improve pain-free mobility.     Baseline L LE strength grossly 5/5 MMT except L hip flexion 4+/5 (no pain with resisted L knee ext.).     Time 4   Period Weeks   Status Partially Met     PT LONG TERM GOAL #2   Title Pt. will increase LEFS to >60 out of 80 to improve pain-free mobility/ return to work without limitations.     Baseline LEFS: 30 out of 80 on 07/13/16  (regression in funcitonal outcome score)   Time 4   Period Weeks   Status Not Met     PT LONG TERM GOAL #3   Title Pt. able to participate with 45 gym based/ aquatic ex. program with no increase c/o L knee pain or limitations reported.    Baseline Pt. participating with gym based and aquatic ex. with minimal to no c/o L knee issues.    Time 4   Period Weeks   Status Achieved     PT LONG TERM GOAL #4   Title Pt. able to ambulate 10 min. with normalized gait pattern and consistent step pattern to improve pain-free mobility.     Baseline Slight antalgic gait (improved).    Time 4   Period Weeks   Status Partially Met     PT LONG TERM GOAL #5   Title Pt. able to ascends/descend 4 steps with recip. pattern and no handrails with no c/o L knee pain to improve return to PLOF.     Baseline Pt. occasionally descends stairs with backwards/step to pattern (pt. feels it is more efficient/ safer).     Time 4   Period Weeks   Status Partially Met     Additional Long Term Goals   Additional Long  Term Goals Yes     PT LONG TERM GOAL #6   Title Pt. able to return to work with no L knee pain/limitations safely.    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  MD f/u 10/5   PT Home Exercise Plan See handouts   Consulted and Agree with Plan of Care Patient      Patient will benefit from skilled therapeutic intervention in order to improve the following deficits and impairments:  Decreased strength, Postural dysfunction, Difficulty walking, Decreased mobility, Impaired flexibility, Decreased range of motion, Decreased balance, Pain  Visit Diagnosis: Pain in left knee  Muscle weakness (generalized)  Joint stiffness of knee, left  Gait difficulty     Problem List Patient Active Problem List   Diagnosis Date Noted  . Arthritis of knee 04/26/2015  . Primary osteoarthritis of right knee 04/25/2015  . Closed fracture of right patella 04/25/2015  . 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 Posterior tibial tendonitis 08/07/2013  . Precordial pain 05/26/2013  . Essential hypertension 10/19/2009  . Diabetes type 2, controlled ([place]) 10/15/2009  . Hyperlipemia 10/15/2009  . CAD, NATIVE VESSEL 10/15/2009   Pura Spice, PT, DPT # [id number] 07/20/2016, 7:58 AM  [facility] [address] [place], [place], [address] Phone: [phone]   Fax:  [phone]  Name: AVARI GELLES MRN: [id number] Date of  Birth: 21-Mar-1946

## 2016-07-25 ENCOUNTER — Other Ambulatory Visit: Payer: Self-pay | Admitting: Emergency Medicine

## 2016-07-25 ENCOUNTER — Ambulatory Visit: Payer: 59 | Admitting: Physical Therapy

## 2016-07-25 ENCOUNTER — Encounter: Payer: Self-pay | Admitting: Physical Therapy

## 2016-07-25 DIAGNOSIS — M6281 Muscle weakness (generalized): Secondary | ICD-10-CM

## 2016-07-25 DIAGNOSIS — M25662 Stiffness of left knee, not elsewhere classified: Secondary | ICD-10-CM | POA: Diagnosis not present

## 2016-07-25 DIAGNOSIS — M25562 Pain in left knee: Secondary | ICD-10-CM

## 2016-07-25 DIAGNOSIS — R269 Unspecified abnormalities of gait and mobility: Secondary | ICD-10-CM | POA: Diagnosis not present

## 2016-07-25 MED FILL — EPINEPHRINE 0.3 MG AUTO-INJ: 0.3 | 30 days supply | Qty: 2 | Fill #0

## 2016-07-26 DIAGNOSIS — M25512 Pain in left shoulder: Secondary | ICD-10-CM | POA: Diagnosis not present

## 2016-07-26 DIAGNOSIS — M19041 Primary osteoarthritis, right hand: Secondary | ICD-10-CM | POA: Diagnosis not present

## 2016-07-26 DIAGNOSIS — M19042 Primary osteoarthritis, left hand: Secondary | ICD-10-CM | POA: Diagnosis not present

## 2016-07-26 DIAGNOSIS — M19032 Primary osteoarthritis, left wrist: Secondary | ICD-10-CM | POA: Diagnosis not present

## 2016-07-26 NOTE — Therapy (Addendum)
[facility] [address] [place], [place], [address] Phone: [phone]   Fax:  [phone]  Physical Therapy Treatment  Patient Details  Name: Kyle [name] MRN: [id number] Date of Birth: 1946-09-09 Referring Provider: Dr. Frederik Pear  Encounter Date: 07/25/2016      PT End of Session - 07/25/16 0909    Visit Number 11   Number of Visits 16   Date for PT Re-Evaluation 08/15/16   Authorization - Visit Number 11   Authorization - Number of Visits 18   PT Start Time 5170   PT Stop Time 0959   PT Time Calculation (min) 61 min   Activity Tolerance Patient tolerated treatment well;No increased pain   Behavior During Therapy WFL for tasks assessed/performed      Past Medical History:  Diagnosis Date  . Allergy   . Arthritis   . Asthma    pt. states very mild  . Basal cell carcinoma    "shoulder"  . Complication of anesthesia    pt. states that neck is arthritic and can not move neck very well.   . Coronary artery disease    non obstructive  . Diabetes mellitus without complication ([place])   . Difficult intubation   . DJD (degenerative joint disease)    "bad" (04/27/2015)  . GERD (gastroesophageal reflux disease)    omeprazole PRN  . Glucose intolerance (pre-diabetes) dx'd 03/2015  . Hypercholesterolemia   . Hypertension   . Insomnia    ambien  . Squamous carcinoma ([place])    "left chest/abd"  . Tubular adenoma 2006  . Weakness of right arm     Past Surgical History:  Procedure Laterality Date  . BASAL CELL CARCINOMA EXCISION     "shoulder"  . BLEPHAROPLASTY Bilateral 2000's  . CARDIAC CATHETERIZATION  2007; 2014  . COLONOSCOPY     pt. states that may have had a polyp  . EYE SURGERY    . JOINT REPLACEMENT    . KNEE ARTHROSCOPY Right X 2  . KNEE ARTHROSCOPY WITH EXCISION BAKER'S CYST Left 03/17/2016   Procedure: LEFT KNEE ARTHROSCOPY WITH DRAINAGE OF BAKER'S CYST;  Surgeon: Frederik Pear, MD;  Location: [place];  Service: Orthopedics;  Laterality: Left;  . KNEE ARTHROSCOPY WITH LATERAL MENISECTOMY Left 03/17/2016   Procedure: LEFT KNEE ARTHROSCOPY WITH LATERAL MENISECTOMY;  Surgeon: Frederik Pear, MD;  Location: [place];  Service: Orthopedics;  Laterality: Left;  . KNEE ARTHROSCOPY WITH MEDIAL MENISECTOMY Left 03/17/2016   Procedure: LEFT KNEE ARTHROSCOPY WITH PARTIAL MEDIAL AND LATERAL MENISECTOMY AND DEBRIDEMENT CHONDROMALACIA;  Surgeon: Frederik Pear, MD;  Location: [place];  Service: Orthopedics;  Laterality: Left;  . LEFT HEART CATHETERIZATION WITH CORONARY ANGIOGRAM N/A 05/26/2013   Procedure: LEFT HEART CATHETERIZATION WITH CORONARY ANGIOGRAM;  Surgeon: Burnell Blanks, MD;  Location: [facility] CATH LAB;  Service: Cardiovascular;  Laterality: N/A;  . SALIVARY GLAND SURGERY  1999   Dr Thornell Mule  . SQUAMOUS CELL CARCINOMA EXCISION Left    "chest/abd"  . TOTAL HIP ARTHROPLASTY Left 2004  . TOTAL KNEE ARTHROPLASTY Right 04/26/2015   Procedure: RIGHT TOTAL KNEE ARTHROPLASTY;  Surgeon: Frederik Pear, MD;  Location: [place];  Service: Orthopedics;  Laterality: Right;  RIGHT TOTAL KNEE ARTHROPLASTY    There were no vitals filed for this visit.      Subjective Assessment - 07/25/16 0901    Subjective Pt. reports 5/10 R medial knee pain and 2/10 L knee  discomfort.   Pt. states he was standing from a dining room chair when R knee pain started.     Patient is accompained by: Family member   Limitations Standing;Walking;House hold activities;Other (comment)   Patient Stated Goals return to work/ exercise with no limitations.     Currently in Pain? Yes   Pain Score 5    Pain Location Knee   Pain Orientation Right;Medial      OBJECTIVE: There.ex.: Scifit L1060mn. B UE/LE (no charge/ warm-up)- consistent cadence. High marching forward and backwards/ step ups/ touches/ tandem gait/ toe and heel walking/ increase step pattern with upright posture. Resisted gait at  Nautilus (80#) 10x all 4-planes with no UE assist.Supine L and R knee/hip isometrics 10x with max. manual resistance.TG knee flexion 30x/ unilateral 10x2 each/ heel raises 20x each. Reassessment of R knee stability/ R medial aspect of knee tenderness.  ITB stretches/ reassessment of hip strength.      Pt response for medical necessity: Pt. Benefits from LE strengthening ex./ manual tx. To increase LE flexibility and progression of gym based ex. Program. Marked improvement in movement after there.ex./ stretches. Limited by R medial knee pain and tenderness today.    Increase c/o R knee pain since pt. reports sharp pain while standing from dining chair.  Good R knee stability (previous TKA) but palpable tenderness along pes anserine insertion site.  Pt. ambulates with slight R antalgic gait due to pain/ fear of pain.  Good L knee flexion with no signifcant swelling.        PT Long Term Goals - 07/19/16 1617      PT LONG TERM GOAL #1   Title Pt. I with HEP to increase L LE muscle strength to 5/5 MMT to improve pain-free mobility.     Baseline L LE strength grossly 5/5 MMT except L hip flexion 4+/5 (no pain with resisted L knee ext.).     Time 4   Period Weeks   Status Partially Met     PT LONG TERM GOAL #2   Title Pt. will increase LEFS to >60 out of 80 to improve pain-free mobility/ return to work without limitations.     Baseline LEFS: 30 out of 80 on 07/13/16  (regression in funcitonal outcome score)   Time 4   Period Weeks   Status Not Met     PT LONG TERM GOAL #3   Title Pt. able to participate with 45 gym based/ aquatic ex. program with no increase c/o L knee pain or limitations reported.    Baseline Pt. participating with gym based and aquatic ex. with minimal to no c/o L knee issues.    Time 4   Period Weeks   Status Achieved     PT LONG TERM GOAL #4   Title Pt. able to ambulate 10 min. with normalized gait pattern and consistent step pattern to  improve pain-free mobility.     Baseline Slight antalgic gait (improved).    Time 4   Period Weeks   Status Partially Met     PT LONG TERM GOAL #5   Title Pt. able to ascends/descend 4 steps with recip. pattern and no handrails with no c/o L knee pain to improve return to PLOF.     Baseline Pt. occasionally descends stairs with backwards/step to pattern (pt. feels it is more efficient/ safer).     Time 4   Period Weeks   Status Partially Met     Additional Long Term Goals  Additional Long Term Goals Yes     PT LONG TERM GOAL #6   Title Pt. able to return to work with no L knee pain/limitations safely.    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  MD f/u 10/5   PT Home Exercise Plan See handouts   Consulted and Agree with Plan of Care Patient      Patient will benefit from skilled therapeutic intervention in order to improve the following deficits and impairments:  Decreased strength, Postural dysfunction, Difficulty walking, Decreased mobility, Impaired flexibility, Decreased range of motion, Decreased balance, Pain  Visit Diagnosis: Pain in left knee  Muscle weakness (generalized)  Joint stiffness of knee, left  Gait difficulty     Problem List Patient Active Problem List   Diagnosis Date Noted  . Arthritis of knee 04/26/2015  . Primary osteoarthritis of right knee 04/25/2015  . Closed fracture of right patella 04/25/2015  . 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 Posterior tibial tendonitis 08/07/2013  . Precordial pain 05/26/2013  . Essential hypertension 10/19/2009  . Diabetes type 2, controlled ([place]) 10/15/2009  . Hyperlipemia 10/15/2009  . CAD, NATIVE VESSEL 10/15/2009   Pura Spice, PT, DPT # [id number] 07/26/2016,  2:23 PM  [facility] [address] [place], [place], [address] Phone: [phone]   Fax:  [phone]  Name: Kyle [name] MRN: [id number] Date of Birth: 04/17/1946

## 2016-08-01 ENCOUNTER — Encounter: Payer: Self-pay | Admitting: Physical Therapy

## 2016-08-01 ENCOUNTER — Ambulatory Visit: Payer: 59 | Attending: Orthopedic Surgery | Admitting: Physical Therapy

## 2016-08-01 ENCOUNTER — Encounter: Payer: 59 | Admitting: Physical Therapy

## 2016-08-01 DIAGNOSIS — R269 Unspecified abnormalities of gait and mobility: Secondary | ICD-10-CM

## 2016-08-01 DIAGNOSIS — M25562 Pain in left knee: Secondary | ICD-10-CM | POA: Insufficient documentation

## 2016-08-01 DIAGNOSIS — G8929 Other chronic pain: Secondary | ICD-10-CM | POA: Diagnosis not present

## 2016-08-01 DIAGNOSIS — M25662 Stiffness of left knee, not elsewhere classified: Secondary | ICD-10-CM

## 2016-08-01 DIAGNOSIS — M6281 Muscle weakness (generalized): Secondary | ICD-10-CM

## 2016-08-01 DIAGNOSIS — M85842 Other specified disorders of bone density and structure, left hand: Secondary | ICD-10-CM | POA: Diagnosis not present

## 2016-08-01 MED FILL — VENTOLIN HFA 90 MCG INHALER: 108 (90 BAS | 25 days supply | Qty: 18 | Fill #0

## 2016-08-01 MED FILL — ASMANEX HFA 200 MCG INHALER: 200 | 30 days supply | Qty: 13 | Fill #0

## 2016-08-01 NOTE — Therapy (Signed)
[facility] [address] [place], [place], [address] Phone: [phone]   Fax:  [phone]  Physical Therapy Treatment  Patient Details  Name: Kyle [name] MRN: [id number] Date of Birth: Dec 30, 1945 Referring Provider: Dr. Frederik Pear  Encounter Date: 08/01/2016      PT End of Session - 08/01/16 0734    Visit Number 12   Number of Visits 16   Date for PT Re-Evaluation 08/15/16   Authorization - Visit Number 12   Authorization - Number of Visits 18   PT Start Time 0728   PT Stop Time 0901   PT Time Calculation (min) 93 min   Activity Tolerance Patient tolerated treatment well;No increased pain   Behavior During Therapy WFL for tasks assessed/performed      Past Medical History:  Diagnosis Date  . Allergy   . Arthritis   . Asthma    pt. states very mild  . Basal cell carcinoma    "shoulder"  . Complication of anesthesia    pt. states that neck is arthritic and can not move neck very well.   . Coronary artery disease    non obstructive  . Diabetes mellitus without complication ([place])   . Difficult intubation   . DJD (degenerative joint disease)    "bad" (04/27/2015)  . GERD (gastroesophageal reflux disease)    omeprazole PRN  . Glucose intolerance (pre-diabetes) dx'd 03/2015  . Hypercholesterolemia   . Hypertension   . Insomnia    ambien  . Squamous carcinoma    "left chest/abd"  . Tubular adenoma 2006  . Weakness of right arm     Past Surgical History:  Procedure Laterality Date  . BASAL CELL CARCINOMA EXCISION     "shoulder"  . BLEPHAROPLASTY Bilateral 2000's  . CARDIAC CATHETERIZATION  2007; 2014  . COLONOSCOPY     pt. states that may have had a polyp  . EYE SURGERY    . JOINT REPLACEMENT    . KNEE ARTHROSCOPY Right X 2  . KNEE ARTHROSCOPY WITH EXCISION BAKER'S CYST Left 03/17/2016   Procedure: LEFT KNEE ARTHROSCOPY WITH DRAINAGE OF BAKER'S CYST;  Surgeon: Frederik Pear, MD;  Location: [place];  Service: Orthopedics;  Laterality: Left;  . KNEE ARTHROSCOPY WITH LATERAL MENISECTOMY Left 03/17/2016   Procedure: LEFT KNEE ARTHROSCOPY WITH LATERAL MENISECTOMY;  Surgeon: Frederik Pear, MD;  Location: [place];  Service: Orthopedics;  Laterality: Left;  . KNEE ARTHROSCOPY WITH MEDIAL MENISECTOMY Left 03/17/2016   Procedure: LEFT KNEE ARTHROSCOPY WITH PARTIAL MEDIAL AND LATERAL MENISECTOMY AND DEBRIDEMENT CHONDROMALACIA;  Surgeon: Frederik Pear, MD;  Location: [place];  Service: Orthopedics;  Laterality: Left;  . LEFT HEART CATHETERIZATION WITH CORONARY ANGIOGRAM N/A 05/26/2013   Procedure: LEFT HEART CATHETERIZATION WITH CORONARY ANGIOGRAM;  Surgeon: Burnell Blanks, MD;  Location: [facility] CATH LAB;  Service: Cardiovascular;  Laterality: N/A;  . SALIVARY GLAND SURGERY  1999   Dr Thornell Mule  . SQUAMOUS CELL CARCINOMA EXCISION Left    "chest/abd"  . TOTAL HIP ARTHROPLASTY Left 2004  . TOTAL KNEE ARTHROPLASTY Right 04/26/2015   Procedure: RIGHT TOTAL KNEE ARTHROPLASTY;  Surgeon: Frederik Pear, MD;  Location: [place];  Service: Orthopedics;  Laterality: Right;  RIGHT TOTAL KNEE ARTHROPLASTY    There were no vitals filed for this visit.      Subjective Assessment - 08/01/16 0726    Subjective Pt. reports R knee pain has remained persistent since increase pain last  week while getting up from couch (around pes anserine region and palpable tenderness noted).  Pt. feels R knee/ patella is not tracking well.  No increase c/o L knee issues.  Pt. has limited walking program over past weekend due to R medial knee pain.     Patient is accompained by: Family member   Limitations Standing;Walking;House hold activities;Other (comment)   Patient Stated Goals return to work/ exercise with no limitations.     Currently in Pain? Yes   Pain Score 4    Pain Location Knee   Pain Orientation Right        OBJECTIVE: There.ex.: Scifit L1010 min. Pre-tx/ post-tx B UE/LE (no  charge/ warm-up)- consistent cadence. BOSU lungesforward 20x (cuing for increase hold time/ good knee alignment). TG knee flexion 30x/ unilateral 10x2 each/ heel raises 20x each.  Nautilus ex.: side stepping 95#/ forward 125#/ backwards 95# (decrease wt. Due to knee pain).  Walking partial lunges/ standing hip ex./ supine SLR/ bicycles 10x each (cuing to breath).  Supine L/R hamstring/ hip stretches.     Pt response for medical necessity: Pt. Benefits from LE strengthening ex./ manual tx. To increase LE flexibility and progression of gym based ex. Program. Marked improvement in movement after there.ex./ stretches.       PT Long Term Goals - 07/19/16 1617      PT LONG TERM GOAL #1   Title Pt. I with HEP to increase L LE muscle strength to 5/5 MMT to improve pain-free mobility.     Baseline L LE strength grossly 5/5 MMT except L hip flexion 4+/5 (no pain with resisted L knee ext.).     Time 4   Period Weeks   Status Partially Met     PT LONG TERM GOAL #2   Title Pt. will increase LEFS to >60 out of 80 to improve pain-free mobility/ return to work without limitations.     Baseline LEFS: 30 out of 80 on 07/13/16  (regression in funcitonal outcome score)   Time 4   Period Weeks   Status Not Met     PT LONG TERM GOAL #3   Title Pt. able to participate with 45 gym based/ aquatic ex. program with no increase c/o L knee pain or limitations reported.    Baseline Pt. participating with gym based and aquatic ex. with minimal to no c/o L knee issues.    Time 4   Period Weeks   Status Achieved     PT LONG TERM GOAL #4   Title Pt. able to ambulate 10 min. with normalized gait pattern and consistent step pattern to improve pain-free mobility.     Baseline Slight antalgic gait (improved).    Time 4   Period Weeks   Status Partially Met     PT LONG TERM GOAL #5   Title Pt. able to ascends/descend 4 steps with recip. pattern and no handrails with no c/o L knee pain  to improve return to PLOF.     Baseline Pt. occasionally descends stairs with backwards/step to pattern (pt. feels it is more efficient/ safer).     Time 4   Period Weeks   Status Partially Met     Additional Long Term Goals   Additional Long Term Goals Yes     PT LONG TERM GOAL #6   Title Pt. able to return to work with no L knee pain/limitations safely.    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 has continued to show consistent progress towards PT goals over past month but recent R knee issues have limited progression since last appt.  No significant swelling noted in R knee as compared to L.  L/R knee AROM: flexion (130/ 113 deg.), extension (-5/-6 deg.).  Good patellar tracking noted in B knees.  (+) R medial knee/proximal tibia tenderness with palpation.  PT recommends continued ther.ex. on a consistnet basis with PT and gym based ex. program.  Pt. will continue with aquatic based ex./ swimming.       Rehab Potential Good   PT Frequency 2x / week   PT Duration 4 weeks   PT Treatment/Interventions ADLs/Self Care Home Management;Aquatic Therapy;Cryotherapy;Electrical Stimulation;Patient/family education;Neuromuscular re-education;Balance training;Therapeutic exercise;Therapeutic activities;Functional mobility training;Stair training;Gait training;Manual techniques   PT Next Visit Plan Progress quad/LE stability with higher level balance and endurance tasks.  Discuss MD appt.    PT Home Exercise Plan See handouts   Consulted and Agree with Plan of Care Patient      Patient will benefit from skilled therapeutic intervention in order to improve the following deficits and impairments:  Decreased strength, Postural dysfunction, Difficulty walking, Decreased mobility, Impaired flexibility, Decreased range of motion, Decreased balance, Pain  Visit Diagnosis: Chronic pain of left  knee  Muscle weakness (generalized)  Joint stiffness of knee, left  Gait difficulty     Problem List Patient Active Problem List   Diagnosis Date Noted  . Arthritis of knee 04/26/2015  . Primary osteoarthritis of right knee 04/25/2015  . Closed fracture of right patella 04/25/2015  . 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 Posterior tibial tendonitis 08/07/2013  . Precordial pain 05/26/2013  . Essential hypertension 10/19/2009  . Diabetes type 2, controlled ([place]) 10/15/2009  . Hyperlipemia 10/15/2009  . CAD, NATIVE VESSEL 10/15/2009   Pura Spice, PT, DPT # [id number] 08/02/2016, 9:13 AM  [facility] [address] [place], [place], [address] Phone: [phone]   Fax:  [phone]  Name: Kyle [name] MRN: [id number] Date of Birth: [birth year]

## 2016-08-02 DIAGNOSIS — M7051 Other bursitis of knee, right knee: Secondary | ICD-10-CM | POA: Diagnosis not present

## 2016-08-03 ENCOUNTER — Encounter: Payer: 59 | Admitting: Physical Therapy

## 2016-08-08 ENCOUNTER — Encounter: Payer: Self-pay | Admitting: Physical Therapy

## 2016-08-08 ENCOUNTER — Ambulatory Visit: Payer: 59 | Admitting: Physical Therapy

## 2016-08-08 DIAGNOSIS — R269 Unspecified abnormalities of gait and mobility: Secondary | ICD-10-CM | POA: Diagnosis not present

## 2016-08-08 DIAGNOSIS — M6281 Muscle weakness (generalized): Secondary | ICD-10-CM | POA: Diagnosis not present

## 2016-08-08 DIAGNOSIS — M25662 Stiffness of left knee, not elsewhere classified: Secondary | ICD-10-CM | POA: Diagnosis not present

## 2016-08-08 DIAGNOSIS — G8929 Other chronic pain: Secondary | ICD-10-CM | POA: Diagnosis not present

## 2016-08-08 DIAGNOSIS — M85842 Other specified disorders of bone density and structure, left hand: Secondary | ICD-10-CM | POA: Diagnosis not present

## 2016-08-08 DIAGNOSIS — M25562 Pain in left knee: Secondary | ICD-10-CM | POA: Diagnosis not present

## 2016-08-09 NOTE — Therapy (Addendum)
[facility] [address] [place], [place], [address] Phone: [phone]   Fax:  [phone]  Physical Therapy Treatment  Patient Details  Name: Kyle [name] MRN: [id number] Date of Birth: [birth year] Referring Provider: Dr. Frederik Pear  Encounter Date: 08/08/2016    Past Medical History:  Diagnosis Date  . Allergy   . Arthritis   . Asthma    pt. states very mild  . Basal cell carcinoma    "shoulder"  . Complication of anesthesia    pt. states that neck is arthritic and can not move neck very well.   . Coronary artery disease    non obstructive  . Diabetes mellitus without complication ([place])   . Difficult intubation   . DJD (degenerative joint disease)    "bad" (04/27/2015)  . GERD (gastroesophageal reflux disease)    omeprazole PRN  . Glucose intolerance (pre-diabetes) dx'd 03/2015  . Hypercholesterolemia   . Hypertension   . Insomnia    ambien  . Squamous carcinoma    "left chest/abd"  . Tubular adenoma 2006  . Weakness of right arm     Past Surgical History:  Procedure Laterality Date  . BASAL CELL CARCINOMA EXCISION     "shoulder"  . BLEPHAROPLASTY Bilateral 2000's  . CARDIAC CATHETERIZATION  2007; 2014  . COLONOSCOPY     pt. states that may have had a polyp  . EYE SURGERY    . JOINT REPLACEMENT    . KNEE ARTHROSCOPY Right X 2  . KNEE ARTHROSCOPY WITH EXCISION BAKER'S CYST Left 03/17/2016   Procedure: LEFT KNEE ARTHROSCOPY WITH DRAINAGE OF BAKER'S CYST;  Surgeon: Frederik Pear, MD;  Location: [place];  Service: Orthopedics;  Laterality: Left;  . KNEE ARTHROSCOPY WITH LATERAL MENISECTOMY Left 03/17/2016   Procedure: LEFT KNEE ARTHROSCOPY WITH LATERAL MENISECTOMY;  Surgeon: Frederik Pear, MD;  Location: [place];  Service: Orthopedics;  Laterality: Left;  . KNEE ARTHROSCOPY WITH MEDIAL MENISECTOMY Left 03/17/2016   Procedure: LEFT KNEE ARTHROSCOPY WITH PARTIAL MEDIAL AND LATERAL  MENISECTOMY AND DEBRIDEMENT CHONDROMALACIA;  Surgeon: Frederik Pear, MD;  Location: [place];  Service: Orthopedics;  Laterality: Left;  . LEFT HEART CATHETERIZATION WITH CORONARY ANGIOGRAM N/A 05/26/2013   Procedure: LEFT HEART CATHETERIZATION WITH CORONARY ANGIOGRAM;  Surgeon: Burnell Blanks, MD;  Location: [facility] CATH LAB;  Service: Cardiovascular;  Laterality: N/A;  . SALIVARY GLAND SURGERY  1999   Dr Thornell Mule  . SQUAMOUS CELL CARCINOMA EXCISION Left    "chest/abd"  . TOTAL HIP ARTHROPLASTY Left 2004  . TOTAL KNEE ARTHROPLASTY Right 04/26/2015   Procedure: RIGHT TOTAL KNEE ARTHROPLASTY;  Surgeon: Frederik Pear, MD;  Location: [place];  Service: Orthopedics;  Laterality: Right;  RIGHT TOTAL KNEE ARTHROPLASTY    There were no vitals filed for this visit.   Pt. Reports high levels of R knee pain (7/10) and states he had difficulty sleeping last night (?injection).     OBJECTIVE: There.ex.: Scifit L1054mn. Pre-tx/ post-tx B UE/LE (no charge/ warm-up)- consistent cadence. BOSU lungesforward 20x (cuing for increase hold time/ good knee alignment). TG knee flexion 30x/ unilateral 10x2 each/ heel raises 20x each. Nautilus ex.: side stepping 95#/ forward 125#/ backwards 95# (decrease wt. Due to knee pain).  Walking partial lunges/ standing hip ex./ supine SLR/ bicycles 10x each (cuing to breath).  Supine L/R hamstring/ hip stretches.  GTB monster walks in clinic 15 feet x 6.   Pt response for medical necessity: Pt.  Benefits from LE strengthening ex./ manual tx. To increase LE flexibility and progression of gym based ex. Program. Marked improvement in movement after there.ex./ stretches.    Pts. R knee continues to limit strengthening progression ther.ex. for B LE.  Palpable tenderness over R medial knee with brusing noted. PT had to modify depth of knee flexion on TG and focused on ITB stretches/ glut. med. strengthening.          PT Long Term Goals  - 07/19/16 1617      PT LONG TERM GOAL #1   Title Pt. I with HEP to increase L LE muscle strength to 5/5 MMT to improve pain-free mobility.     Baseline L LE strength grossly 5/5 MMT except L hip flexion 4+/5 (no pain with resisted L knee ext.).     Time 4   Period Weeks   Status Partially Met     PT LONG TERM GOAL #2   Title Pt. will increase LEFS to >60 out of 80 to improve pain-free mobility/ return to work without limitations.     Baseline LEFS: 30 out of 80 on 07/13/16  (regression in funcitonal outcome score)   Time 4   Period Weeks   Status Not Met     PT LONG TERM GOAL #3   Title Pt. able to participate with 45 gym based/ aquatic ex. program with no increase c/o L knee pain or limitations reported.    Baseline Pt. participating with gym based and aquatic ex. with minimal to no c/o L knee issues.    Time 4   Period Weeks   Status Achieved     PT LONG TERM GOAL #4   Title Pt. able to ambulate 10 min. with normalized gait pattern and consistent step pattern to improve pain-free mobility.     Baseline Slight antalgic gait (improved).    Time 4   Period Weeks   Status Partially Met     PT LONG TERM GOAL #5   Title Pt. able to ascends/descend 4 steps with recip. pattern and no handrails with no c/o L knee pain to improve return to PLOF.     Baseline Pt. occasionally descends stairs with backwards/step to pattern (pt. feels it is more efficient/ safer).     Time 4   Period Weeks   Status Partially Met     Additional Long Term Goals   Additional Long Term Goals Yes     PT LONG TERM GOAL #6   Title Pt. able to return to work with no L knee pain/limitations safely.    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 Arthritis of knee 04/26/2015  . Primary osteoarthritis of right knee 04/25/2015  . Closed fracture of right patella 04/25/2015  . 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 Posterior tibial tendonitis 08/07/2013  . Precordial pain 05/26/2013  . Essential hypertension 10/19/2009  . Diabetes type 2, controlled ([place]) 10/15/2009  . Hyperlipemia 10/15/2009  . CAD, NATIVE  VESSEL 10/15/2009   Pura Spice, PT, DPT # [id number] 08/09/2016, 5:22 PM  [facility] [address] [place], [place], [address] Phone: [phone]   Fax:  [phone]  Name: Kyle [name] MRN: [id number] Date of Birth: [birth year]

## 2016-08-17 ENCOUNTER — Ambulatory Visit: Payer: 59 | Admitting: Physical Therapy

## 2016-08-17 DIAGNOSIS — M25662 Stiffness of left knee, not elsewhere classified: Secondary | ICD-10-CM

## 2016-08-17 DIAGNOSIS — M6281 Muscle weakness (generalized): Secondary | ICD-10-CM | POA: Diagnosis not present

## 2016-08-17 DIAGNOSIS — M25562 Pain in left knee: Secondary | ICD-10-CM | POA: Diagnosis not present

## 2016-08-17 DIAGNOSIS — R269 Unspecified abnormalities of gait and mobility: Secondary | ICD-10-CM | POA: Diagnosis not present

## 2016-08-17 DIAGNOSIS — G8929 Other chronic pain: Secondary | ICD-10-CM

## 2016-08-17 DIAGNOSIS — M85842 Other specified disorders of bone density and structure, left hand: Secondary | ICD-10-CM | POA: Diagnosis not present

## 2016-08-18 NOTE — Therapy (Signed)
[facility] [address]. [place], [place], [address] Phone: [phone]   Fax:  [phone]  Physical Therapy Treatment  Patient Details  Name: Kyle [name] MRN: [id number] Date of Birth: 1946-05-23 Referring Provider: Dr. Frederik Pear  Encounter Date: 08/17/2016    Past Medical History:  Diagnosis Date  . Allergy   . Arthritis   . Asthma    pt. states very mild  . Basal cell carcinoma    "shoulder"  . Complication of anesthesia    pt. states that neck is arthritic and can not move neck very well.   . Coronary artery disease    non obstructive  . Diabetes mellitus without complication ([place])   . Difficult intubation   . DJD (degenerative joint disease)    "bad" (04/27/2015)  . GERD (gastroesophageal reflux disease)    omeprazole PRN  . Glucose intolerance (pre-diabetes) dx'd 03/2015  . Hypercholesterolemia   . Hypertension   . Insomnia    ambien  . Squamous carcinoma    "left chest/abd"  . Tubular adenoma 2006  . Weakness of right arm     Past Surgical History:  Procedure Laterality Date  . BASAL CELL CARCINOMA EXCISION     "shoulder"  . BLEPHAROPLASTY Bilateral 2000's  . CARDIAC CATHETERIZATION  2007; 2014  . COLONOSCOPY     pt. states that may have had a polyp  . EYE SURGERY    . JOINT REPLACEMENT    . KNEE ARTHROSCOPY Right X 2  . KNEE ARTHROSCOPY WITH EXCISION BAKER'S CYST Left 03/17/2016   Procedure: LEFT KNEE ARTHROSCOPY WITH DRAINAGE OF BAKER'S CYST;  Surgeon: Frederik Pear, MD;  Location: [place];  Service: Orthopedics;  Laterality: Left;  . KNEE ARTHROSCOPY WITH LATERAL MENISECTOMY Left 03/17/2016   Procedure: LEFT KNEE ARTHROSCOPY WITH LATERAL MENISECTOMY;  Surgeon: Frederik Pear, MD;  Location: [place];  Service: Orthopedics;  Laterality: Left;  . KNEE ARTHROSCOPY WITH MEDIAL MENISECTOMY Left 03/17/2016   Procedure: LEFT KNEE ARTHROSCOPY WITH PARTIAL MEDIAL AND LATERAL  MENISECTOMY AND DEBRIDEMENT CHONDROMALACIA;  Surgeon: Frederik Pear, MD;  Location: [place];  Service: Orthopedics;  Laterality: Left;  . LEFT HEART CATHETERIZATION WITH CORONARY ANGIOGRAM N/A 05/26/2013   Procedure: LEFT HEART CATHETERIZATION WITH CORONARY ANGIOGRAM;  Surgeon: Burnell Blanks, MD;  Location: [facility] CATH LAB;  Service: Cardiovascular;  Laterality: N/A;  . SALIVARY GLAND SURGERY  1999   Dr Thornell Mule  . SQUAMOUS CELL CARCINOMA EXCISION Left    "chest/abd"  . TOTAL HIP ARTHROPLASTY Left 2004  . TOTAL KNEE ARTHROPLASTY Right 04/26/2015   Procedure: RIGHT TOTAL KNEE ARTHROPLASTY;  Surgeon: Frederik Pear, MD;  Location: [place];  Service: Orthopedics;  Laterality: Right;  RIGHT TOTAL KNEE ARTHROPLASTY    There were no vitals filed for this visit.      Subjective Assessment - 08/18/16 0939    Patient is accompained by: Family member   Limitations Standing;Walking;House hold activities;Other (comment)   Patient Stated Goals return to work/ exercise with no limitations.     Currently in Pain? Yes                                      PT Long Term Goals - 07/19/16 1617      PT LONG TERM GOAL #1   Title Pt. I with HEP to increase L LE muscle strength  to 5/5 MMT to improve pain-free mobility.     Baseline L LE strength grossly 5/5 MMT except L hip flexion 4+/5 (no pain with resisted L knee ext.).     Time 4   Period Weeks   Status Partially Met     PT LONG TERM GOAL #2   Title Pt. will increase LEFS to >60 out of 80 to improve pain-free mobility/ return to work without limitations.     Baseline LEFS: 30 out of 80 on 07/13/16  (regression in funcitonal outcome score)   Time 4   Period Weeks   Status Not Met     PT LONG TERM GOAL #3   Title Pt. able to participate with 45 gym based/ aquatic ex. program with no increase c/o L knee pain or limitations reported.    Baseline Pt. participating with gym based and aquatic ex. with minimal to  no c/o L knee issues.    Time 4   Period Weeks   Status Achieved     PT LONG TERM GOAL #4   Title Pt. able to ambulate 10 min. with normalized gait pattern and consistent step pattern to improve pain-free mobility.     Baseline Slight antalgic gait (improved).    Time 4   Period Weeks   Status Partially Met     PT LONG TERM GOAL #5   Title Pt. able to ascends/descend 4 steps with recip. pattern and no handrails with no c/o L knee pain to improve return to PLOF.     Baseline Pt. occasionally descends stairs with backwards/step to pattern (pt. feels it is more efficient/ safer).     Time 4   Period Weeks   Status Partially Met     Additional Long Term Goals   Additional Long Term Goals Yes     PT LONG TERM GOAL #6   Title Pt. able to return to work with no L knee pain/limitations safely.    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  Discuss MD appt. (Rheumatologist).     Consulted and Agree with Plan of Care Patient      Patient will benefit from skilled therapeutic intervention in order to improve the following deficits and impairments:  Decreased strength, Postural dysfunction, Difficulty walking, Decreased mobility, Impaired flexibility, Decreased range of motion, Decreased balance, Pain  Visit Diagnosis: Chronic pain of left knee  Muscle weakness (generalized)  Joint stiffness of knee, left  Gait difficulty       G-Codes -  2016/09/02 0941    Functional Assessment Tool Used LEFS/ muscle weakness/ gait difficulty/ pain   Functional Limitation Mobility: Walking and moving around   Mobility: Walking and Moving Around Current Status (Y7741) At least 1 percent but less than 20 percent impaired, limited or restricted   Mobility: Walking and Moving Around Goal Status (O8786) 0 percent impaired, limited or restricted      Problem List Patient Active Problem List   Diagnosis Date Noted  . Arthritis of  knee 04/26/2015  . Primary osteoarthritis of right knee 04/25/2015  . Closed fracture of right patella 04/25/2015  . 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 Posterior tibial tendonitis 08/07/2013  . Precordial pain 05/26/2013  . Essential hypertension 10/19/2009  . Diabetes type 2, controlled ([place]) 10/15/2009  . Hyperlipemia 10/15/2009  . CAD, NATIVE VESSEL 10/15/2009    Pura Spice 08/18/2016, 9:43 AM  [facility] [address] [place], [place], [address] Phone: [phone]   Fax:  [phone]  Name: Kyle [name] MRN: [id number] Date of Birth: 04-25-1946

## 2016-08-21 DIAGNOSIS — L570 Actinic keratosis: Secondary | ICD-10-CM | POA: Diagnosis not present

## 2016-08-21 DIAGNOSIS — Z96642 Presence of left artificial hip joint: Secondary | ICD-10-CM | POA: Diagnosis not present

## 2016-08-21 DIAGNOSIS — R3129 Other microscopic hematuria: Secondary | ICD-10-CM | POA: Diagnosis not present

## 2016-08-21 DIAGNOSIS — M19012 Primary osteoarthritis, left shoulder: Secondary | ICD-10-CM | POA: Diagnosis not present

## 2016-08-21 DIAGNOSIS — M2540 Effusion, unspecified joint: Secondary | ICD-10-CM | POA: Diagnosis not present

## 2016-08-21 DIAGNOSIS — Z7951 Long term (current) use of inhaled steroids: Secondary | ICD-10-CM | POA: Diagnosis not present

## 2016-08-21 DIAGNOSIS — M19041 Primary osteoarthritis, right hand: Secondary | ICD-10-CM | POA: Diagnosis not present

## 2016-08-21 DIAGNOSIS — Z7952 Long term (current) use of systemic steroids: Secondary | ICD-10-CM | POA: Diagnosis not present

## 2016-08-21 DIAGNOSIS — M19042 Primary osteoarthritis, left hand: Secondary | ICD-10-CM | POA: Diagnosis not present

## 2016-08-21 DIAGNOSIS — M170 Bilateral primary osteoarthritis of knee: Secondary | ICD-10-CM | POA: Diagnosis not present

## 2016-08-21 DIAGNOSIS — M1990 Unspecified osteoarthritis, unspecified site: Secondary | ICD-10-CM | POA: Diagnosis not present

## 2016-08-21 DIAGNOSIS — Z7982 Long term (current) use of aspirin: Secondary | ICD-10-CM | POA: Diagnosis not present

## 2016-08-21 DIAGNOSIS — M479 Spondylosis, unspecified: Secondary | ICD-10-CM | POA: Diagnosis not present

## 2016-08-21 DIAGNOSIS — Z7984 Long term (current) use of oral hypoglycemic drugs: Secondary | ICD-10-CM | POA: Diagnosis not present

## 2016-08-21 DIAGNOSIS — I2510 Atherosclerotic heart disease of native coronary artery without angina pectoris: Secondary | ICD-10-CM | POA: Diagnosis not present

## 2016-08-21 DIAGNOSIS — M160 Bilateral primary osteoarthritis of hip: Secondary | ICD-10-CM | POA: Diagnosis not present

## 2016-08-21 DIAGNOSIS — Z880 Allergy status to penicillin: Secondary | ICD-10-CM | POA: Diagnosis not present

## 2016-08-21 DIAGNOSIS — M19011 Primary osteoarthritis, right shoulder: Secondary | ICD-10-CM | POA: Diagnosis not present

## 2016-08-22 ENCOUNTER — Ambulatory Visit: Payer: 59 | Admitting: Physical Therapy

## 2016-08-22 DIAGNOSIS — R269 Unspecified abnormalities of gait and mobility: Secondary | ICD-10-CM | POA: Diagnosis not present

## 2016-08-22 DIAGNOSIS — M25662 Stiffness of left knee, not elsewhere classified: Secondary | ICD-10-CM | POA: Diagnosis not present

## 2016-08-22 DIAGNOSIS — M25562 Pain in left knee: Secondary | ICD-10-CM | POA: Diagnosis not present

## 2016-08-22 DIAGNOSIS — G8929 Other chronic pain: Secondary | ICD-10-CM | POA: Diagnosis not present

## 2016-08-22 DIAGNOSIS — M85842 Other specified disorders of bone density and structure, left hand: Secondary | ICD-10-CM | POA: Diagnosis not present

## 2016-08-22 DIAGNOSIS — M6281 Muscle weakness (generalized): Secondary | ICD-10-CM

## 2016-08-23 ENCOUNTER — Encounter: Payer: Self-pay | Admitting: Physical Therapy

## 2016-08-23 NOTE — Therapy (Signed)
[facility] [address]. [place], [place], [address] Phone: [phone]   Fax:  [phone]  Physical Therapy Treatment  Patient Details  Name: Kyle [name] MRN: [id number] Date of Birth: [birth year] Referring Provider: Dr. Frederik Pear  Encounter Date: 08/22/2016    Past Medical History:  Diagnosis Date  . Allergy   . Arthritis   . Asthma    pt. states very mild  . Basal cell carcinoma    "shoulder"  . Complication of anesthesia    pt. states that neck is arthritic and can not move neck very well.   . Coronary artery disease    non obstructive  . Diabetes mellitus without complication ([place])   . Difficult intubation   . DJD (degenerative joint disease)    "bad" (04/27/2015)  . GERD (gastroesophageal reflux disease)    omeprazole PRN  . Glucose intolerance (pre-diabetes) dx'd 03/2015  . Hypercholesterolemia   . Hypertension   . Insomnia    ambien  . Squamous carcinoma    "left chest/abd"  . Tubular adenoma 2006  . Weakness of right arm     Past Surgical History:  Procedure Laterality Date  . BASAL CELL CARCINOMA EXCISION     "shoulder"  . BLEPHAROPLASTY Bilateral 2000's  . CARDIAC CATHETERIZATION  2007; 2014  . COLONOSCOPY     pt. states that may have had a polyp  . EYE SURGERY    . JOINT REPLACEMENT    . KNEE ARTHROSCOPY Right X 2  . KNEE ARTHROSCOPY WITH EXCISION BAKER'S CYST Left 03/17/2016   Procedure: LEFT KNEE ARTHROSCOPY WITH DRAINAGE OF BAKER'S CYST;  Surgeon: Frederik Pear, MD;  Location: [place];  Service: Orthopedics;  Laterality: Left;  . KNEE ARTHROSCOPY WITH LATERAL MENISECTOMY Left 03/17/2016   Procedure: LEFT KNEE ARTHROSCOPY WITH LATERAL MENISECTOMY;  Surgeon: Frederik Pear, MD;  Location: [place];  Service: Orthopedics;  Laterality: Left;  . KNEE ARTHROSCOPY WITH MEDIAL MENISECTOMY Left 03/17/2016   Procedure: LEFT KNEE ARTHROSCOPY WITH PARTIAL MEDIAL AND LATERAL  MENISECTOMY AND DEBRIDEMENT CHONDROMALACIA;  Surgeon: Frederik Pear, MD;  Location: [place];  Service: Orthopedics;  Laterality: Left;  . LEFT HEART CATHETERIZATION WITH CORONARY ANGIOGRAM N/A 05/26/2013   Procedure: LEFT HEART CATHETERIZATION WITH CORONARY ANGIOGRAM;  Surgeon: Burnell Blanks, MD;  Location: [facility] CATH LAB;  Service: Cardiovascular;  Laterality: N/A;  . SALIVARY GLAND SURGERY  1999   Dr Thornell Mule  . SQUAMOUS CELL CARCINOMA EXCISION Left    "chest/abd"  . TOTAL HIP ARTHROPLASTY Left 2004  . TOTAL KNEE ARTHROPLASTY Right 04/26/2015   Procedure: RIGHT TOTAL KNEE ARTHROPLASTY;  Surgeon: Frederik Pear, MD;  Location: [place];  Service: Orthopedics;  Laterality: Right;  RIGHT TOTAL KNEE ARTHROPLASTY    There were no vitals filed for this visit.      Subjective Assessment - 08/23/16 1512    Subjective Pt. states Rheumatogists states pt. has significant OA with an inflammatory component.  Pt. prescribed a new med. but has not started.     Patient is accompained by: Family member   Limitations Standing;Walking;House hold activities;Other (comment)   Patient Stated Goals return to work/ exercise with no limitations.     Currently in Pain? Yes   Pain Score 2    Pain Location Knee  PT Long Term Goals - 07/19/16 1617      PT LONG TERM GOAL #1   Title Pt. I with HEP to increase L LE muscle strength to 5/5 MMT to improve pain-free mobility.     Baseline L LE strength grossly 5/5 MMT except L hip flexion 4+/5 (no pain with resisted L knee ext.).     Time 4   Period Weeks   Status Partially Met     PT LONG TERM GOAL #2   Title Pt. will increase LEFS to >60 out of 80 to improve pain-free mobility/ return to work without limitations.     Baseline LEFS: 30 out of 80 on 07/13/16  (regression in funcitonal outcome score)   Time 4   Period Weeks   Status Not Met     PT LONG TERM GOAL #3   Title Pt. able  to participate with 45 gym based/ aquatic ex. program with no increase c/o L knee pain or limitations reported.    Baseline Pt. participating with gym based and aquatic ex. with minimal to no c/o L knee issues.    Time 4   Period Weeks   Status Achieved     PT LONG TERM GOAL #4   Title Pt. able to ambulate 10 min. with normalized gait pattern and consistent step pattern to improve pain-free mobility.     Baseline Slight antalgic gait (improved).    Time 4   Period Weeks   Status Partially Met     PT LONG TERM GOAL #5   Title Pt. able to ascends/descend 4 steps with recip. pattern and no handrails with no c/o L knee pain to improve return to PLOF.     Baseline Pt. occasionally descends stairs with backwards/step to pattern (pt. feels it is more efficient/ safer).     Time 4   Period Weeks   Status Partially Met     Additional Long Term Goals   Additional Long Term Goals Yes     PT LONG TERM GOAL #6   Title Pt. able to return to work with no L knee pain/limitations safely.    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        Patient will benefit from skilled therapeutic intervention in order to improve the following deficits and impairments:  Decreased strength, Postural dysfunction, Difficulty walking, Decreased mobility, Impaired flexibility, Decreased range of motion, Decreased balance, Pain  Visit Diagnosis: Chronic pain of left  knee  Muscle weakness (generalized)  Joint stiffness of knee, left  Gait difficulty     Problem List Patient Active Problem List   Diagnosis Date Noted  . Arthritis of knee 04/26/2015  . Primary osteoarthritis of right knee 04/25/2015  . Closed fracture of right patella 04/25/2015  . 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 Posterior tibial tendonitis 08/07/2013  . Precordial pain 05/26/2013  . Essential hypertension 10/19/2009  . Diabetes type 2, controlled ([place]) 10/15/2009  . Hyperlipemia 10/15/2009  . CAD, NATIVE VESSEL 10/15/2009  Pura Spice 08/23/2016, 3:16 PM  [facility] [address] [place], [place], [address] Phone: [phone]   Fax:  [phone]  Name: Kyle [name] MRN: [id number] Date of Birth: 04/01/1946

## 2016-08-24 DIAGNOSIS — M1990 Unspecified osteoarthritis, unspecified site: Secondary | ICD-10-CM | POA: Diagnosis not present

## 2016-08-24 DIAGNOSIS — M19071 Primary osteoarthritis, right ankle and foot: Secondary | ICD-10-CM | POA: Diagnosis not present

## 2016-08-24 DIAGNOSIS — M85871 Other specified disorders of bone density and structure, right ankle and foot: Secondary | ICD-10-CM | POA: Diagnosis not present

## 2016-08-24 DIAGNOSIS — M152 Bouchard's nodes (with arthropathy): Secondary | ICD-10-CM | POA: Diagnosis not present

## 2016-08-24 DIAGNOSIS — M85842 Other specified disorders of bone density and structure, left hand: Secondary | ICD-10-CM | POA: Diagnosis not present

## 2016-08-28 ENCOUNTER — Ambulatory Visit: Payer: 59 | Admitting: Physical Therapy

## 2016-08-28 DIAGNOSIS — R269 Unspecified abnormalities of gait and mobility: Secondary | ICD-10-CM

## 2016-08-28 DIAGNOSIS — M25662 Stiffness of left knee, not elsewhere classified: Secondary | ICD-10-CM | POA: Diagnosis not present

## 2016-08-28 DIAGNOSIS — M6281 Muscle weakness (generalized): Secondary | ICD-10-CM | POA: Diagnosis not present

## 2016-08-28 DIAGNOSIS — G8929 Other chronic pain: Secondary | ICD-10-CM | POA: Diagnosis not present

## 2016-08-28 DIAGNOSIS — M85842 Other specified disorders of bone density and structure, left hand: Secondary | ICD-10-CM | POA: Diagnosis not present

## 2016-08-28 DIAGNOSIS — M25562 Pain in left knee: Principal | ICD-10-CM

## 2016-08-29 ENCOUNTER — Encounter: Payer: 59 | Admitting: Physical Therapy

## 2016-08-29 NOTE — Therapy (Signed)
[facility] [address] [place], [place], [address] Phone: [phone]   Fax:  [phone]  Physical Therapy Treatment  Patient Details  Name: Kyle [name] MRN: [id number] Date of Birth: June 12, 1946 Referring Provider: Dr. Frederik Pear  Encounter Date: 08/28/2016    Past Medical History:  Diagnosis Date  . Allergy   . Arthritis   . Asthma    pt. states very mild  . Basal cell carcinoma    "shoulder"  . Complication of anesthesia    pt. states that neck is arthritic and can not move neck very well.   . Coronary artery disease    non obstructive  . Diabetes mellitus without complication ([place])   . Difficult intubation   . DJD (degenerative joint disease)    "bad" (04/27/2015)  . GERD (gastroesophageal reflux disease)    omeprazole PRN  . Glucose intolerance (pre-diabetes) dx'd 03/2015  . Hypercholesterolemia   . Hypertension   . Insomnia    ambien  . Squamous carcinoma    "left chest/abd"  . Tubular adenoma 2006  . Weakness of right arm     Past Surgical History:  Procedure Laterality Date  . BASAL CELL CARCINOMA EXCISION     "shoulder"  . BLEPHAROPLASTY Bilateral 2000's  . CARDIAC CATHETERIZATION  2007; 2014  . COLONOSCOPY     pt. states that may have had a polyp  . EYE SURGERY    . JOINT REPLACEMENT    . KNEE ARTHROSCOPY Right X 2  . KNEE ARTHROSCOPY WITH EXCISION BAKER'S CYST Left 03/17/2016   Procedure: LEFT KNEE ARTHROSCOPY WITH DRAINAGE OF BAKER'S CYST;  Surgeon: Frederik Pear, MD;  Location: [place];  Service: Orthopedics;  Laterality: Left;  . KNEE ARTHROSCOPY WITH LATERAL MENISECTOMY Left 03/17/2016   Procedure: LEFT KNEE ARTHROSCOPY WITH LATERAL MENISECTOMY;  Surgeon: Frederik Pear, MD;  Location: [place];  Service: Orthopedics;  Laterality: Left;  . KNEE ARTHROSCOPY WITH MEDIAL MENISECTOMY Left 03/17/2016   Procedure: LEFT KNEE ARTHROSCOPY WITH PARTIAL MEDIAL AND LATERAL  MENISECTOMY AND DEBRIDEMENT CHONDROMALACIA;  Surgeon: Frederik Pear, MD;  Location: [place];  Service: Orthopedics;  Laterality: Left;  . LEFT HEART CATHETERIZATION WITH CORONARY ANGIOGRAM N/A 05/26/2013   Procedure: LEFT HEART CATHETERIZATION WITH CORONARY ANGIOGRAM;  Surgeon: Burnell Blanks, MD;  Location: [facility] CATH LAB;  Service: Cardiovascular;  Laterality: N/A;  . SALIVARY GLAND SURGERY  1999   Dr Thornell Mule  . SQUAMOUS CELL CARCINOMA EXCISION Left    "chest/abd"  . TOTAL HIP ARTHROPLASTY Left 2004  . TOTAL KNEE ARTHROPLASTY Right 04/26/2015   Procedure: RIGHT TOTAL KNEE ARTHROPLASTY;  Surgeon: Frederik Pear, MD;  Location: [place];  Service: Orthopedics;  Laterality: Right;  RIGHT TOTAL KNEE ARTHROPLASTY    There were no vitals filed for this visit.      Subjective Assessment - 08/29/16 1408    Subjective Pt. woke with increase L lower leg swelling and discomfort.  No MOI reported but pt. states he went [place]     Patient is accompained by: Family member   Limitations Standing;Walking;House hold activities;Other (comment)   Patient Stated Goals return to work/ exercise with no limitations.     Currently in Pain? Yes   Pain Score 2  PT Long Term Goals - 07/19/16 1617      PT LONG TERM GOAL #1   Title Pt. I with HEP to increase L LE muscle strength to 5/5 MMT to improve pain-free mobility.     Baseline L LE strength grossly 5/5 MMT except L hip flexion 4+/5 (no pain with resisted L knee ext.).     Time 4   Period Weeks   Status Partially Met     PT LONG TERM GOAL #2   Title Pt. will increase LEFS to >60 out of 80 to improve pain-free mobility/ return to work without limitations.     Baseline LEFS: 30 out of 80 on 07/13/16  (regression in funcitonal outcome score)   Time 4   Period Weeks   Status Not Met     PT LONG TERM GOAL #3   Title Pt. able to  participate with 45 gym based/ aquatic ex. program with no increase c/o L knee pain or limitations reported.    Baseline Pt. participating with gym based and aquatic ex. with minimal to no c/o L knee issues.    Time 4   Period Weeks   Status Achieved     PT LONG TERM GOAL #4   Title Pt. able to ambulate 10 min. with normalized gait pattern and consistent step pattern to improve pain-free mobility.     Baseline Slight antalgic gait (improved).    Time 4   Period Weeks   Status Partially Met     PT LONG TERM GOAL #5   Title Pt. able to ascends/descend 4 steps with recip. pattern and no handrails with no c/o L knee pain to improve return to PLOF.     Baseline Pt. occasionally descends stairs with backwards/step to pattern (pt. feels it is more efficient/ safer).     Time 4   Period Weeks   Status Partially Met     Additional Long Term Goals   Additional Long Term Goals Yes     PT LONG TERM GOAL #6   Title Pt. able to return to work with no L knee pain/limitations safely.    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     PT Home Exercise Plan See handouts   Consulted and Agree with Plan of Care Patient      Patient will benefit from skilled therapeutic intervention in order to improve the following deficits and impairments:  Decreased strength, Postural dysfunction, Difficulty walking, Decreased mobility, Impaired flexibility, Decreased range of  motion, Decreased balance, Pain  Visit Diagnosis: Chronic pain of left knee  Muscle weakness (generalized)  Joint stiffness of knee, left  Gait difficulty     Problem List Patient Active Problem List   Diagnosis Date Noted  . Arthritis of knee 04/26/2015  . Primary osteoarthritis of right knee 04/25/2015  . Closed fracture of right patella 04/25/2015  . 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 Posterior tibial tendonitis 08/07/2013  . Precordial pain 05/26/2013  . Essential hypertension 10/19/2009  .  Diabetes type 2, controlled ([place]) 10/15/2009  . Hyperlipemia 10/15/2009  . CAD, NATIVE VESSEL 10/15/2009    Pura Spice 08/29/2016, 2:10 PM  [facility] [address] [place], [place], [address] Phone: [phone]   Fax:  [phone]  Name: Kyle [name] MRN: [id number] Date of Birth: 03-28-1946

## 2016-09-04 ENCOUNTER — Ambulatory Visit: Payer: 59 | Attending: Orthopedic Surgery | Admitting: Physical Therapy

## 2016-09-04 DIAGNOSIS — G8929 Other chronic pain: Secondary | ICD-10-CM | POA: Insufficient documentation

## 2016-09-04 DIAGNOSIS — R269 Unspecified abnormalities of gait and mobility: Secondary | ICD-10-CM | POA: Diagnosis not present

## 2016-09-04 DIAGNOSIS — M6281 Muscle weakness (generalized): Secondary | ICD-10-CM | POA: Diagnosis not present

## 2016-09-04 DIAGNOSIS — M25662 Stiffness of left knee, not elsewhere classified: Secondary | ICD-10-CM | POA: Diagnosis not present

## 2016-09-04 DIAGNOSIS — M25562 Pain in left knee: Secondary | ICD-10-CM | POA: Insufficient documentation

## 2016-09-05 NOTE — Therapy (Signed)
[place] [facility] [address] [place], [place], [address] Phone: [phone]   Fax:  [phone]  Physical Therapy Treatment  Patient Details  Name: Kyle [name] MRN: [id number] Date of Birth: 02/06/1946 Referring Provider: Dr. Frederik Pear  Encounter Date: 09/04/2016    Past Medical History:  Diagnosis Date  . Allergy   . Arthritis   . Asthma    pt. states very mild  . Basal cell carcinoma    "shoulder"  . Complication of anesthesia    pt. states that neck is arthritic and can not move neck very well.   . Coronary artery disease    non obstructive  . Diabetes mellitus without complication ([place])   . Difficult intubation   . DJD (degenerative joint disease)    "bad" (04/27/2015)  . GERD (gastroesophageal reflux disease)    omeprazole PRN  . Glucose intolerance (pre-diabetes) dx'd 03/2015  . Hypercholesterolemia   . Hypertension   . Insomnia    ambien  . Squamous carcinoma    "left chest/abd"  . Tubular adenoma 2006  . Weakness of right arm     Past Surgical History:  Procedure Laterality Date  . BASAL CELL CARCINOMA EXCISION     "shoulder"  . BLEPHAROPLASTY Bilateral 2000's  . CARDIAC CATHETERIZATION  2007; 2014  . COLONOSCOPY     pt. states that may have had a polyp  . EYE SURGERY    . JOINT REPLACEMENT    . KNEE ARTHROSCOPY Right X 2  . KNEE ARTHROSCOPY WITH EXCISION BAKER'S CYST Left 03/17/2016   Procedure: LEFT KNEE ARTHROSCOPY WITH DRAINAGE OF BAKER'S CYST;  Surgeon: Frederik Pear, MD;  Location: [place];  Service: Orthopedics;  Laterality: Left;  . KNEE ARTHROSCOPY WITH LATERAL MENISECTOMY Left 03/17/2016   Procedure: LEFT KNEE ARTHROSCOPY WITH LATERAL MENISECTOMY;  Surgeon: Frederik Pear, MD;  Location: [place];  Service: Orthopedics;  Laterality: Left;  . KNEE ARTHROSCOPY WITH MEDIAL MENISECTOMY Left 03/17/2016   Procedure: LEFT KNEE ARTHROSCOPY WITH PARTIAL MEDIAL AND LATERAL  MENISECTOMY AND DEBRIDEMENT CHONDROMALACIA;  Surgeon: Frederik Pear, MD;  Location: [place];  Service: Orthopedics;  Laterality: Left;  . LEFT HEART CATHETERIZATION WITH CORONARY ANGIOGRAM N/A 05/26/2013   Procedure: LEFT HEART CATHETERIZATION WITH CORONARY ANGIOGRAM;  Surgeon: Burnell Blanks, MD;  [facility]: [facility] CATH LAB;  Service: Cardiovascular;  Laterality: N/A;  . SALIVARY GLAND SURGERY  1999   Dr Thornell Mule  . SQUAMOUS CELL CARCINOMA EXCISION Left    "chest/abd"  . TOTAL HIP ARTHROPLASTY Left 2004  . TOTAL KNEE ARTHROPLASTY Right 04/26/2015   Procedure: RIGHT TOTAL KNEE ARTHROPLASTY;  Surgeon: Frederik Pear, MD;  Location: [place];  Service: Orthopedics;  Laterality: Right;  RIGHT TOTAL KNEE ARTHROPLASTY    There were no vitals filed for this visit.      Subjective Assessment - 09/04/16 0850    Subjective (P)  Pt. had a good fishing trip.  L knee/lower leg has remained swollen, even in morning but not as bad as last week.  L knee pain 5/10 and R knee not hurting (deep ache).     Patient is accompained by: (P)  Family member   Limitations (P)  Standing;Walking;House hold activities;Other (comment)   Patient Stated Goals (P)  return to work/ exercise with no limitations.  PT Long Term Goals - 07/19/16 1617      PT LONG TERM GOAL #1   Title Pt. I with HEP to increase L LE muscle strength to 5/5 MMT to improve pain-free mobility.     Baseline L LE strength grossly 5/5 MMT except L hip flexion 4+/5 (no pain with resisted L knee ext.).     Time 4   Period Weeks   Status Partially Met     PT LONG TERM GOAL #2   Title Pt. will increase LEFS to >60 out of 80 to improve pain-free mobility/ return to work without limitations.     Baseline LEFS: 30 out of 80 on 07/13/16  (regression in funcitonal outcome score)   Time 4   Period Weeks   Status Not Met     PT LONG TERM GOAL #3   Title Pt. able to  participate with 45 gym based/ aquatic ex. program with no increase c/o L knee pain or limitations reported.    Baseline Pt. participating with gym based and aquatic ex. with minimal to no c/o L knee issues.    Time 4   Period Weeks   Status Achieved     PT LONG TERM GOAL #4   Title Pt. able to ambulate 10 min. with normalized gait pattern and consistent step pattern to improve pain-free mobility.     Baseline Slight antalgic gait (improved).    Time 4   Period Weeks   Status Partially Met     PT LONG TERM GOAL #5   Title Pt. able to ascends/descend 4 steps with recip. pattern and no handrails with no c/o L knee pain to improve return to PLOF.     Baseline Pt. occasionally descends stairs with backwards/step to pattern (pt. feels it is more efficient/ safer).     Time 4   Period Weeks   Status Partially Met     Additional Long Term Goals   Additional Long Term Goals Yes     PT LONG TERM GOAL #6   Title Pt. able to return to work with no L knee pain/limitations safely.    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 Arthritis of knee 04/26/2015  . Primary osteoarthritis of right knee 04/25/2015  . Closed fracture of right patella 04/25/2015  . 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 Posterior tibial tendonitis 08/07/2013  . Precordial pain 05/26/2013  . Essential hypertension 10/19/2009  . Diabetes type 2, controlled ([place]) 10/15/2009  . Hyperlipemia 10/15/2009  . CAD, NATIVE VESSEL 10/15/2009    Pura Spice 09/05/2016, 2:08 PM  [place] [facility] [address] [place], [place], [address] Phone: [phone]   Fax:  [phone]  Name: Kyle [name] MRN: [id number] Date of Birth: 1946-09-02

## 2016-09-11 ENCOUNTER — Ambulatory Visit: Payer: 59 | Admitting: Physical Therapy

## 2016-09-12 ENCOUNTER — Ambulatory Visit: Payer: 59 | Admitting: Physical Therapy

## 2016-09-12 DIAGNOSIS — R269 Unspecified abnormalities of gait and mobility: Secondary | ICD-10-CM

## 2016-09-12 DIAGNOSIS — M25662 Stiffness of left knee, not elsewhere classified: Secondary | ICD-10-CM | POA: Diagnosis not present

## 2016-09-12 DIAGNOSIS — M25562 Pain in left knee: Principal | ICD-10-CM

## 2016-09-12 DIAGNOSIS — H2513 Age-related nuclear cataract, bilateral: Secondary | ICD-10-CM | POA: Diagnosis not present

## 2016-09-12 DIAGNOSIS — Z79899 Other long term (current) drug therapy: Secondary | ICD-10-CM | POA: Diagnosis not present

## 2016-09-12 DIAGNOSIS — G8929 Other chronic pain: Secondary | ICD-10-CM

## 2016-09-12 DIAGNOSIS — M6281 Muscle weakness (generalized): Secondary | ICD-10-CM

## 2016-09-12 DIAGNOSIS — H40013 Open angle with borderline findings, low risk, bilateral: Secondary | ICD-10-CM | POA: Diagnosis not present

## 2016-09-13 NOTE — Therapy (Signed)
[place] [facility] [place] [facility] [address] [place], [place], [address] Phone: [phone]   Fax:  [phone]  Physical Therapy Treatment  Patient Details  Name: Kyle [name] MRN: [id number] Date of Birth: [birth year] Referring Provider: Dr. Frederik Pear  Encounter Date: 09/12/2016    Past Medical History:  Diagnosis Date  . Allergy   . Arthritis   . Asthma    pt. states very mild  . Basal cell carcinoma    "shoulder"  . Complication of anesthesia    pt. states that neck is arthritic and can not move neck very well.   . Coronary artery disease    non obstructive  . Diabetes mellitus without complication ([place])   . Difficult intubation   . DJD (degenerative joint disease)    "bad" (04/27/2015)  . GERD (gastroesophageal reflux disease)    omeprazole PRN  . Glucose intolerance (pre-diabetes) dx'd 03/2015  . Hypercholesterolemia   . Hypertension   . Insomnia    ambien  . Squamous carcinoma    "left chest/abd"  . Tubular adenoma 2006  . Weakness of right arm     Past Surgical History:  Procedure Laterality Date  . BASAL CELL CARCINOMA EXCISION     "shoulder"  . BLEPHAROPLASTY Bilateral 2000's  . CARDIAC CATHETERIZATION  2007; 2014  . COLONOSCOPY     pt. states that may have had a polyp  . EYE SURGERY    . JOINT REPLACEMENT    . KNEE ARTHROSCOPY Right X 2  . KNEE ARTHROSCOPY WITH EXCISION BAKER'S CYST Left 03/17/2016   Procedure: LEFT KNEE ARTHROSCOPY WITH DRAINAGE OF BAKER'S CYST;  Surgeon: Frederik Pear, MD;  Location: [place];  Service: Orthopedics;  Laterality: Left;  . KNEE ARTHROSCOPY WITH LATERAL MENISECTOMY Left 03/17/2016   Procedure: LEFT KNEE ARTHROSCOPY WITH LATERAL MENISECTOMY;  Surgeon: Frederik Pear, MD;  Location: [place];  Service: Orthopedics;  Laterality: Left;  . KNEE ARTHROSCOPY WITH MEDIAL MENISECTOMY Left 03/17/2016   Procedure: LEFT KNEE ARTHROSCOPY WITH PARTIAL MEDIAL AND LATERAL  MENISECTOMY AND DEBRIDEMENT CHONDROMALACIA;  Surgeon: Frederik Pear, MD;  Location: [place];  Service: Orthopedics;  Laterality: Left;  . LEFT HEART CATHETERIZATION WITH CORONARY ANGIOGRAM N/A 05/26/2013   Procedure: LEFT HEART CATHETERIZATION WITH CORONARY ANGIOGRAM;  Surgeon: Burnell Blanks, MD;  Location: [facility] CATH LAB;  Service: Cardiovascular;  Laterality: N/A;  . SALIVARY GLAND SURGERY  1999   Dr Thornell Mule  . SQUAMOUS CELL CARCINOMA EXCISION Left    "chest/abd"  . TOTAL HIP ARTHROPLASTY Left 2004  . TOTAL KNEE ARTHROPLASTY Right 04/26/2015   Procedure: RIGHT TOTAL KNEE ARTHROPLASTY;  Surgeon: Frederik Pear, MD;  Location: [place];  Service: Orthopedics;  Laterality: Right;  RIGHT TOTAL KNEE ARTHROPLASTY    There were no vitals filed for this visit.      Subjective Assessment - 09/13/16 1645    Subjective Pt. had a good time in [place] and reports L leg remains swollen (no worse).  Pt. states he did a lot of walking but not much of ex. program or pool.     Limitations Standing;Walking;House hold activities;Other (comment)   Patient Stated Goals return to work/ exercise with no limitations.     Currently in Pain? Yes                                      PT  Long Term Goals - 07/19/16 1617      PT LONG TERM GOAL #1   Title Pt. I with HEP to increase L LE muscle strength to 5/5 MMT to improve pain-free mobility.     Baseline L LE strength grossly 5/5 MMT except L hip flexion 4+/5 (no pain with resisted L knee ext.).     Time 4   Period Weeks   Status Partially Met     PT LONG TERM GOAL #2   Title Pt. will increase LEFS to >60 out of 80 to improve pain-free mobility/ return to work without limitations.     Baseline LEFS: 30 out of 80 on 07/13/16  (regression in funcitonal outcome score)   Time 4   Period Weeks   Status Not Met     PT LONG TERM GOAL #3   Title Pt. able to participate with 45 gym based/ aquatic ex. program with no increase  c/o L knee pain or limitations reported.    Baseline Pt. participating with gym based and aquatic ex. with minimal to no c/o L knee issues.    Time 4   Period Weeks   Status Achieved     PT LONG TERM GOAL #4   Title Pt. able to ambulate 10 min. with normalized gait pattern and consistent step pattern to improve pain-free mobility.     Baseline Slight antalgic gait (improved).    Time 4   Period Weeks   Status Partially Met     PT LONG TERM GOAL #5   Title Pt. able to ascends/descend 4 steps with recip. pattern and no handrails with no c/o L knee pain to improve return to PLOF.     Baseline Pt. occasionally descends stairs with backwards/step to pattern (pt. feels it is more efficient/ safer).     Time 4   Period Weeks   Status Partially Met     Additional Long Term Goals   Additional Long Term Goals Yes     PT LONG TERM GOAL #6   Title Pt. able to return to work with no L knee pain/limitations safely.    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   Time 4   Period Weeks   Status New               Plan - 09/13/16 1646    Rehab Potential Good   PT Frequency 2x / week   PT Duration 4 weeks   PT Treatment/Interventions ADLs/Self Care Home Management;Aquatic Therapy;Cryotherapy;Electrical Stimulation;Patient/family education;Neuromuscular re-education;Balance training;Therapeutic exercise;Therapeutic activities;Functional mobility training;Stair training;Gait training;Manual techniques      Patient will benefit from skilled therapeutic intervention in order to improve the following deficits and impairments:  Decreased strength, Postural dysfunction, Difficulty walking, Decreased mobility, Impaired flexibility, Decreased range of motion, Decreased balance, Pain  Visit Diagnosis: Chronic pain of left knee  Muscle weakness (generalized)  Joint stiffness of knee, left  Gait difficulty     Problem List Patient Active Problem List   Diagnosis Date Noted  . Arthritis  of knee 04/26/2015  . Primary osteoarthritis of right knee 04/25/2015  . Closed fracture of right patella 04/25/2015  . 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 Posterior tibial tendonitis 08/07/2013  . Precordial pain 05/26/2013  . Essential hypertension 10/19/2009  . Diabetes type 2, controlled ([place]) 10/15/2009  . Hyperlipemia 10/15/2009  . CAD, NATIVE VESSEL 10/15/2009    Pura Spice 09/13/2016, 4:47 PM  [place] [facility] [address] [place], [place], [address] Phone: [phone]   Fax:  [phone]  Name: Kyle [name] MRN: [id number] Date of Birth: 1946/01/25

## 2016-09-16 MED FILL — VENTOLIN HFA 90 MCG INHALER: 108 (90 BAS | 25 days supply | Qty: 18 | Fill #1

## 2016-09-16 MED FILL — METFORMIN HCL ER 500 MG TAB: 500 | 90 days supply | Qty: 270 | Fill #1

## 2016-09-16 MED FILL — METOPROLOL SUCC ER 50 MG TA: 50 | 90 days supply | Qty: 90 | Fill #1

## 2016-09-18 DIAGNOSIS — M1712 Unilateral primary osteoarthritis, left knee: Secondary | ICD-10-CM | POA: Diagnosis not present

## 2016-09-18 DIAGNOSIS — M25462 Effusion, left knee: Secondary | ICD-10-CM | POA: Diagnosis not present

## 2016-09-18 MED FILL — DILTIAZEM ER 240 MG TABLET: 240 | 90 days supply | Qty: 90 | Fill #1

## 2016-09-25 ENCOUNTER — Ambulatory Visit: Payer: 59 | Admitting: Physical Therapy

## 2016-09-25 DIAGNOSIS — M79671 Pain in right foot: Secondary | ICD-10-CM | POA: Diagnosis not present

## 2016-09-27 ENCOUNTER — Ambulatory Visit: Payer: 59 | Admitting: Physical Therapy

## 2016-09-27 DIAGNOSIS — M25562 Pain in left knee: Secondary | ICD-10-CM | POA: Diagnosis not present

## 2016-09-27 DIAGNOSIS — M25662 Stiffness of left knee, not elsewhere classified: Secondary | ICD-10-CM

## 2016-09-27 DIAGNOSIS — G8929 Other chronic pain: Secondary | ICD-10-CM | POA: Diagnosis not present

## 2016-09-27 DIAGNOSIS — R269 Unspecified abnormalities of gait and mobility: Secondary | ICD-10-CM

## 2016-09-27 DIAGNOSIS — M6281 Muscle weakness (generalized): Secondary | ICD-10-CM | POA: Diagnosis not present

## 2016-09-28 DIAGNOSIS — M1712 Unilateral primary osteoarthritis, left knee: Secondary | ICD-10-CM | POA: Diagnosis not present

## 2016-09-28 MED FILL — OXYCODONE/APAP 5/325 MG TAB: 5-325 | 30 days supply | Qty: 60 | Fill #0

## 2016-09-28 NOTE — Therapy (Signed)
[place] [facility] [address] [place], [place], [address] Phone: [phone]   Fax:  [phone]  Physical Therapy Treatment  Patient Details  Name: Kyle [name] MRN: [id number] Date of Birth: 1945/12/22 Referring Provider: Dr. Frederik Pear  Encounter Date: 09/27/2016    Past Medical History:  Diagnosis Date  . Allergy   . Arthritis   . Asthma    pt. states very mild  . Basal cell carcinoma    "shoulder"  . Complication of anesthesia    pt. states that neck is arthritic and can not move neck very well.   . Coronary artery disease    non obstructive  . Diabetes mellitus without complication ([place])   . Difficult intubation   . DJD (degenerative joint disease)    "bad" (04/27/2015)  . GERD (gastroesophageal reflux disease)    omeprazole PRN  . Glucose intolerance (pre-diabetes) dx'd 03/2015  . Hypercholesterolemia   . Hypertension   . Insomnia    ambien  . Squamous carcinoma    "left chest/abd"  . Tubular adenoma 2006  . Weakness of right arm     Past Surgical History:  Procedure Laterality Date  . BASAL CELL CARCINOMA EXCISION     "shoulder"  . BLEPHAROPLASTY Bilateral 2000's  . CARDIAC CATHETERIZATION  2007; 2014  . COLONOSCOPY     pt. states that may have had a polyp  . EYE SURGERY    . JOINT REPLACEMENT    . KNEE ARTHROSCOPY Right X 2  . KNEE ARTHROSCOPY WITH EXCISION BAKER'S CYST Left 03/17/2016   Procedure: LEFT KNEE ARTHROSCOPY WITH DRAINAGE OF BAKER'S CYST;  Surgeon: Frederik Pear, MD;  Location: [place];  Service: Orthopedics;  Laterality: Left;  . KNEE ARTHROSCOPY WITH LATERAL MENISECTOMY Left 03/17/2016   Procedure: LEFT KNEE ARTHROSCOPY WITH LATERAL MENISECTOMY;  Surgeon: Frederik Pear, MD;  Location: [place];  Service: Orthopedics;  Laterality: Left;  . KNEE ARTHROSCOPY WITH MEDIAL MENISECTOMY Left 03/17/2016   Procedure: LEFT KNEE ARTHROSCOPY WITH PARTIAL MEDIAL AND LATERAL  MENISECTOMY AND DEBRIDEMENT CHONDROMALACIA;  Surgeon: Frederik Pear, MD;  Location: [place];  Service: Orthopedics;  Laterality: Left;  . LEFT HEART CATHETERIZATION WITH CORONARY ANGIOGRAM N/A 05/26/2013   Procedure: LEFT HEART CATHETERIZATION WITH CORONARY ANGIOGRAM;  Surgeon: Burnell Blanks, MD;  Location: [facility] CATH LAB;  Service: Cardiovascular;  Laterality: N/A;  . SALIVARY GLAND SURGERY  1999   Dr Thornell Mule  . SQUAMOUS CELL CARCINOMA EXCISION Left    "chest/abd"  . TOTAL HIP ARTHROPLASTY Left 2004  . TOTAL KNEE ARTHROPLASTY Right 04/26/2015   Procedure: RIGHT TOTAL KNEE ARTHROPLASTY;  Surgeon: Frederik Pear, MD;  Location: [place];  Service: Orthopedics;  Laterality: Right;  RIGHT TOTAL KNEE ARTHROPLASTY    There were no vitals filed for this visit.      Subjective Assessment - 09/28/16 0733    Subjective Pt. had 30 cc's of fluid aspirated from L knee.  Pt. reports he has been doing better since swelling/ fluid down.  Pt. will have Synvisc injection tomorrow (1st of 3 scheduled).     Patient is accompained by: Family member   Limitations Standing;Walking;House hold activities;Other (comment)   Patient Stated Goals return to work/ exercise with no limitations.     Currently in Pain? Yes  PT Long Term Goals - 07/19/16 1617      PT LONG TERM GOAL #1   Title Pt. I with HEP to increase L LE muscle strength to 5/5 MMT to improve pain-free mobility.     Baseline L LE strength grossly 5/5 MMT except L hip flexion 4+/5 (no pain with resisted L knee ext.).     Time 4   Period Weeks   Status Partially Met     PT LONG TERM GOAL #2   Title Pt. will increase LEFS to >60 out of 80 to improve pain-free mobility/ return to work without limitations.     Baseline LEFS: 30 out of 80 on 07/13/16  (regression in funcitonal outcome score)   Time 4   Period Weeks   Status Not Met     PT LONG TERM GOAL #3   Title Pt.  able to participate with 45 gym based/ aquatic ex. program with no increase c/o L knee pain or limitations reported.    Baseline Pt. participating with gym based and aquatic ex. with minimal to no c/o L knee issues.    Time 4   Period Weeks   Status Achieved     PT LONG TERM GOAL #4   Title Pt. able to ambulate 10 min. with normalized gait pattern and consistent step pattern to improve pain-free mobility.     Baseline Slight antalgic gait (improved).    Time 4   Period Weeks   Status Partially Met     PT LONG TERM GOAL #5   Title Pt. able to ascends/descend 4 steps with recip. pattern and no handrails with no c/o L knee pain to improve return to PLOF.     Baseline Pt. occasionally descends stairs with backwards/step to pattern (pt. feels it is more efficient/ safer).     Time 4   Period Weeks   Status Partially Met     Additional Long Term Goals   Additional Long Term Goals Yes     PT LONG TERM GOAL #6   Title Pt. able to return to work with no L knee pain/limitations safely.    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     PT Home Exercise Plan See handouts   Consulted and Agree with Plan of Care Patient      Patient will benefit from skilled therapeutic intervention in order to improve the following deficits and impairments:  Decreased strength, Postural dysfunction, Difficulty walking, Decreased mobility, Impaired flexibility, Decreased  range of motion, Decreased balance, Pain  Visit Diagnosis: Chronic pain of left knee  Muscle weakness (generalized)  Joint stiffness of knee, left  Gait difficulty     Problem List Patient Active Problem List   Diagnosis Date Noted  . Arthritis of knee 04/26/2015  . Primary osteoarthritis of right knee 04/25/2015  . Closed fracture of right patella 04/25/2015  . 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 Posterior tibial tendonitis 08/07/2013  . Precordial pain 05/26/2013  . Essential hypertension 10/19/2009  .  Diabetes type 2, controlled ([place]) 10/15/2009  . Hyperlipemia 10/15/2009  . CAD, NATIVE VESSEL 10/15/2009    Pura Spice 09/28/2016, 7:36 AM  [place] [facility] [address] [place], [place], [address] Phone: [phone]   Fax:  [phone]  Name: Kyle [name] MRN: [id number] Date of Birth: [birth year]

## 2016-09-29 MED FILL — ZOLPIDEM TARTRATE 10 MG TAB: 10 | 90 days supply | Qty: 90 | Fill #1

## 2016-09-29 MED FILL — FLUTICASONE PROP 50 MCG SPR: 50 | 90 days supply | Qty: 48 | Fill #0

## 2016-10-02 ENCOUNTER — Ambulatory Visit: Payer: 59 | Attending: Orthopedic Surgery | Admitting: Physical Therapy

## 2016-10-02 DIAGNOSIS — M6281 Muscle weakness (generalized): Secondary | ICD-10-CM | POA: Insufficient documentation

## 2016-10-02 DIAGNOSIS — M25562 Pain in left knee: Secondary | ICD-10-CM | POA: Insufficient documentation

## 2016-10-02 DIAGNOSIS — R269 Unspecified abnormalities of gait and mobility: Secondary | ICD-10-CM | POA: Insufficient documentation

## 2016-10-02 DIAGNOSIS — G8929 Other chronic pain: Secondary | ICD-10-CM | POA: Insufficient documentation

## 2016-10-02 DIAGNOSIS — M25662 Stiffness of left knee, not elsewhere classified: Secondary | ICD-10-CM | POA: Diagnosis not present

## 2016-10-03 NOTE — Therapy (Signed)
[place] [facility] [address] [place], [place], [address] Phone: [phone]   Fax:  [phone]  Physical Therapy Treatment  Patient Details  Name: Kyle [name] MRN: [id number] Date of Birth: 1945/11/25 Referring Provider: Dr. Frederik Pear  Encounter Date: 10/02/2016    Past Medical History:  Diagnosis Date  . Allergy   . Arthritis   . Asthma    pt. states very mild  . Basal cell carcinoma    "shoulder"  . Complication of anesthesia    pt. states that neck is arthritic and can not move neck very well.   . Coronary artery disease    non obstructive  . Diabetes mellitus without complication ([place])   . Difficult intubation   . DJD (degenerative joint disease)    "bad" (04/27/2015)  . GERD (gastroesophageal reflux disease)    omeprazole PRN  . Glucose intolerance (pre-diabetes) dx'd 03/2015  . Hypercholesterolemia   . Hypertension   . Insomnia    ambien  . Squamous carcinoma    "left chest/abd"  . Tubular adenoma 2006  . Weakness of right arm     Past Surgical History:  Procedure Laterality Date  . BASAL CELL CARCINOMA EXCISION     "shoulder"  . BLEPHAROPLASTY Bilateral 2000's  . CARDIAC CATHETERIZATION  2007; 2014  . COLONOSCOPY     pt. states that may have had a polyp  . EYE SURGERY    . JOINT REPLACEMENT    . KNEE ARTHROSCOPY Right X 2  . KNEE ARTHROSCOPY WITH EXCISION BAKER'S CYST Left 03/17/2016   Procedure: LEFT KNEE ARTHROSCOPY WITH DRAINAGE OF BAKER'S CYST;  Surgeon: Frederik Pear, MD;  Location: [place];  Service: Orthopedics;  Laterality: Left;  . KNEE ARTHROSCOPY WITH LATERAL MENISECTOMY Left 03/17/2016   Procedure: LEFT KNEE ARTHROSCOPY WITH LATERAL MENISECTOMY;  Surgeon: Frederik Pear, MD;  Location: [place];  Service: Orthopedics;  Laterality: Left;  . KNEE ARTHROSCOPY WITH MEDIAL MENISECTOMY Left 03/17/2016   Procedure: LEFT KNEE ARTHROSCOPY WITH PARTIAL MEDIAL AND LATERAL  MENISECTOMY AND DEBRIDEMENT CHONDROMALACIA;  Surgeon: Frederik Pear, MD;  Location: [place];  Service: Orthopedics;  Laterality: Left;  . LEFT HEART CATHETERIZATION WITH CORONARY ANGIOGRAM N/A 05/26/2013   Procedure: LEFT HEART CATHETERIZATION WITH CORONARY ANGIOGRAM;  Surgeon: Burnell Blanks, MD;  Location: [facility] CATH LAB;  Service: Cardiovascular;  Laterality: N/A;  . SALIVARY GLAND SURGERY  1999   Dr Thornell Mule  . SQUAMOUS CELL CARCINOMA EXCISION Left    "chest/abd"  . TOTAL HIP ARTHROPLASTY Left 2004  . TOTAL KNEE ARTHROPLASTY Right 04/26/2015   Procedure: RIGHT TOTAL KNEE ARTHROPLASTY;  Surgeon: Frederik Pear, MD;  Location: [place];  Service: Orthopedics;  Laterality: Right;  RIGHT TOTAL KNEE ARTHROPLASTY    There were no vitals filed for this visit.                                    PT Long Term Goals - 07/19/16 1617      PT LONG TERM GOAL #1   Title Pt. I with HEP to increase L LE muscle strength to 5/5 MMT to improve pain-free mobility.     Baseline L LE strength grossly 5/5 MMT except L hip flexion 4+/5 (no pain with resisted L knee ext.).     Time 4   Period Weeks   Status Partially Met  PT LONG TERM GOAL #2   Title Pt. will increase LEFS to >60 out of 80 to improve pain-free mobility/ return to work without limitations.     Baseline LEFS: 30 out of 80 on 07/13/16  (regression in funcitonal outcome score)   Time 4   Period Weeks   Status Not Met     PT LONG TERM GOAL #3   Title Pt. able to participate with 45 gym based/ aquatic ex. program with no increase c/o L knee pain or limitations reported.    Baseline Pt. participating with gym based and aquatic ex. with minimal to no c/o L knee issues.    Time 4   Period Weeks   Status Achieved     PT LONG TERM GOAL #4   Title Pt. able to ambulate 10 min. with normalized gait pattern and consistent step pattern to improve pain-free mobility.     Baseline Slight antalgic gait  (improved).    Time 4   Period Weeks   Status Partially Met     PT LONG TERM GOAL #5   Title Pt. able to ascends/descend 4 steps with recip. pattern and no handrails with no c/o L knee pain to improve return to PLOF.     Baseline Pt. occasionally descends stairs with backwards/step to pattern (pt. feels it is more efficient/ safer).     Time 4   Period Weeks   Status Partially Met     Additional Long Term Goals   Additional Long Term Goals Yes     PT LONG TERM GOAL #6   Title Pt. able to return to work with no L knee pain/limitations safely.    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 Arthritis of knee 04/26/2015  . Primary osteoarthritis of right knee 04/25/2015  . Closed fracture of right patella 04/25/2015  . 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 Posterior tibial tendonitis 08/07/2013  . Precordial pain 05/26/2013  . Essential hypertension 10/19/2009  . Diabetes type 2, controlled ([place]) 10/15/2009  . Hyperlipemia 10/15/2009  . CAD, NATIVE VESSEL 10/15/2009    Pura Spice 10/03/2016, 1:16 PM  [place] [facility] [address] [place], [place], [address] Phone: [phone]   Fax:  [phone]  Name: Kyle [name] MRN: [id number] Date of Birth: 18-Mar-1946

## 2016-10-09 ENCOUNTER — Ambulatory Visit: Payer: 59 | Admitting: Physical Therapy

## 2016-10-09 DIAGNOSIS — M6281 Muscle weakness (generalized): Secondary | ICD-10-CM | POA: Diagnosis not present

## 2016-10-09 DIAGNOSIS — R269 Unspecified abnormalities of gait and mobility: Secondary | ICD-10-CM

## 2016-10-09 DIAGNOSIS — M25662 Stiffness of left knee, not elsewhere classified: Secondary | ICD-10-CM | POA: Diagnosis not present

## 2016-10-09 DIAGNOSIS — G8929 Other chronic pain: Secondary | ICD-10-CM

## 2016-10-09 DIAGNOSIS — M25562 Pain in left knee: Principal | ICD-10-CM

## 2016-10-09 DIAGNOSIS — R739 Hyperglycemia, unspecified: Secondary | ICD-10-CM | POA: Diagnosis not present

## 2016-10-10 NOTE — Therapy (Signed)
[place] [facility] [address] [place], [place], [address] Phone: [phone]   Fax:  [phone]  Physical Therapy Treatment  Patient Details  Name: Kyle [name] MRN: [id number] Date of Birth: 07-Aug-1946 Referring Provider: Dr. Frederik Pear  Encounter Date: 10/09/2016    Past Medical History:  Diagnosis Date  . Allergy   . Arthritis   . Asthma    pt. states very mild  . Basal cell carcinoma    "shoulder"  . Complication of anesthesia    pt. states that neck is arthritic and can not move neck very well.   . Coronary artery disease    non obstructive  . Diabetes mellitus without complication ([place])   . Difficult intubation   . DJD (degenerative joint disease)    "bad" (04/27/2015)  . GERD (gastroesophageal reflux disease)    omeprazole PRN  . Glucose intolerance (pre-diabetes) dx'd 03/2015  . Hypercholesterolemia   . Hypertension   . Insomnia    ambien  . Squamous carcinoma    "left chest/abd"  . Tubular adenoma 2006  . Weakness of right arm     Past Surgical History:  Procedure Laterality Date  . BASAL CELL CARCINOMA EXCISION     "shoulder"  . BLEPHAROPLASTY Bilateral 2000's  . CARDIAC CATHETERIZATION  2007; 2014  . COLONOSCOPY     pt. states that may have had a polyp  . EYE SURGERY    . JOINT REPLACEMENT    . KNEE ARTHROSCOPY Right X 2  . KNEE ARTHROSCOPY WITH EXCISION BAKER'S CYST Left 03/17/2016   Procedure: LEFT KNEE ARTHROSCOPY WITH DRAINAGE OF BAKER'S CYST;  Surgeon: Frederik Pear, MD;  Location: [place];  Service: Orthopedics;  Laterality: Left;  . KNEE ARTHROSCOPY WITH LATERAL MENISECTOMY Left 03/17/2016   Procedure: LEFT KNEE ARTHROSCOPY WITH LATERAL MENISECTOMY;  Surgeon: Frederik Pear, MD;  Location: [place];  Service: Orthopedics;  Laterality: Left;  . KNEE ARTHROSCOPY WITH MEDIAL MENISECTOMY Left 03/17/2016   Procedure: LEFT KNEE ARTHROSCOPY WITH PARTIAL MEDIAL AND LATERAL  MENISECTOMY AND DEBRIDEMENT CHONDROMALACIA;  Surgeon: Frederik Pear, MD;  Location: [place];  Service: Orthopedics;  Laterality: Left;  . LEFT HEART CATHETERIZATION WITH CORONARY ANGIOGRAM N/A 05/26/2013   Procedure: LEFT HEART CATHETERIZATION WITH CORONARY ANGIOGRAM;  Surgeon: Burnell Blanks, MD;  Location: [facility] CATH LAB;  Service: Cardiovascular;  Laterality: N/A;  . SALIVARY GLAND SURGERY  1999   Dr Thornell Mule  . SQUAMOUS CELL CARCINOMA EXCISION Left    "chest/abd"  . TOTAL HIP ARTHROPLASTY Left 2004  . TOTAL KNEE ARTHROPLASTY Right 04/26/2015   Procedure: RIGHT TOTAL KNEE ARTHROPLASTY;  Surgeon: Frederik Pear, MD;  Location: [place];  Service: Orthopedics;  Laterality: Right;  RIGHT TOTAL KNEE ARTHROPLASTY    There were no vitals filed for this visit.                                    PT Long Term Goals - 07/19/16 1617      PT LONG TERM GOAL #1   Title Pt. I with HEP to increase L LE muscle strength to 5/5 MMT to improve pain-free mobility.     Baseline L LE strength grossly 5/5 MMT except L hip flexion 4+/5 (no pain with resisted L knee ext.).     Time 4   Period Weeks   Status Partially Met  PT LONG TERM GOAL #2   Title Pt. will increase LEFS to >60 out of 80 to improve pain-free mobility/ return to work without limitations.     Baseline LEFS: 30 out of 80 on 07/13/16  (regression in funcitonal outcome score)   Time 4   Period Weeks   Status Not Met     PT LONG TERM GOAL #3   Title Pt. able to participate with 45 gym based/ aquatic ex. program with no increase c/o L knee pain or limitations reported.    Baseline Pt. participating with gym based and aquatic ex. with minimal to no c/o L knee issues.    Time 4   Period Weeks   Status Achieved     PT LONG TERM GOAL #4   Title Pt. able to ambulate 10 min. with normalized gait pattern and consistent step pattern to improve pain-free mobility.     Baseline Slight antalgic gait  (improved).    Time 4   Period Weeks   Status Partially Met     PT LONG TERM GOAL #5   Title Pt. able to ascends/descend 4 steps with recip. pattern and no handrails with no c/o L knee pain to improve return to PLOF.     Baseline Pt. occasionally descends stairs with backwards/step to pattern (pt. feels it is more efficient/ safer).     Time 4   Period Weeks   Status Partially Met     Additional Long Term Goals   Additional Long Term Goals Yes     PT LONG TERM GOAL #6   Title Pt. able to return to work with no L knee pain/limitations safely.    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 Arthritis of knee 04/26/2015  . Primary osteoarthritis of right knee 04/25/2015  . Closed fracture of right patella 04/25/2015  . 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 Posterior tibial tendonitis 08/07/2013  . Precordial pain 05/26/2013  . Essential hypertension 10/19/2009  . Diabetes type 2, controlled ([place]) 10/15/2009  . Hyperlipemia 10/15/2009  . CAD, NATIVE VESSEL 10/15/2009    Pura Spice 10/10/2016, 1:15 PM   [facility] [address] [place], [place], [address] Phone: [phone]   Fax:  [phone]  Name: Kyle [name] MRN: [id number] Date of Birth: 1946-05-27

## 2016-10-11 DIAGNOSIS — M1712 Unilateral primary osteoarthritis, left knee: Secondary | ICD-10-CM | POA: Diagnosis not present

## 2016-10-16 ENCOUNTER — Encounter: Payer: 59 | Admitting: Physical Therapy

## 2016-10-16 DIAGNOSIS — L814 Other melanin hyperpigmentation: Secondary | ICD-10-CM | POA: Diagnosis not present

## 2016-10-16 DIAGNOSIS — R7309 Other abnormal glucose: Secondary | ICD-10-CM | POA: Diagnosis not present

## 2016-10-16 DIAGNOSIS — L718 Other rosacea: Secondary | ICD-10-CM | POA: Diagnosis not present

## 2016-10-16 DIAGNOSIS — L570 Actinic keratosis: Secondary | ICD-10-CM | POA: Diagnosis not present

## 2016-10-24 ENCOUNTER — Ambulatory Visit: Payer: 59 | Admitting: Physical Therapy

## 2016-10-24 DIAGNOSIS — M25562 Pain in left knee: Secondary | ICD-10-CM | POA: Diagnosis not present

## 2016-10-24 DIAGNOSIS — R269 Unspecified abnormalities of gait and mobility: Secondary | ICD-10-CM

## 2016-10-24 DIAGNOSIS — G8929 Other chronic pain: Secondary | ICD-10-CM | POA: Diagnosis not present

## 2016-10-24 DIAGNOSIS — M6281 Muscle weakness (generalized): Secondary | ICD-10-CM | POA: Diagnosis not present

## 2016-10-24 DIAGNOSIS — M25662 Stiffness of left knee, not elsewhere classified: Secondary | ICD-10-CM | POA: Diagnosis not present

## 2016-10-25 DIAGNOSIS — M1712 Unilateral primary osteoarthritis, left knee: Secondary | ICD-10-CM | POA: Diagnosis not present

## 2016-10-25 NOTE — Therapy (Signed)
Kyle [name] [name] [address] [place], [place], [address] Phone: [phone]   Fax:  [phone]  Physical Therapy Treatment  Patient Details  Name: Kyle [name] MRN: [id number] Date of Birth: 13-Sep-1946 Referring Provider: Dr. Frederik Pear  Encounter Date: 10/24/2016    Past Medical History:  Diagnosis Date  . Allergy   . Arthritis   . Asthma    pt. states very mild  . Basal cell carcinoma    "shoulder"  . Complication of anesthesia    pt. states that neck is arthritic and can not move neck very well.   . Coronary artery disease    non obstructive  . Diabetes mellitus without complication ([place])   . Difficult intubation   . DJD (degenerative joint disease)    "bad" (04/27/2015)  . GERD (gastroesophageal reflux disease)    omeprazole PRN  . Glucose intolerance (pre-diabetes) dx'd 03/2015  . Hypercholesterolemia   . Hypertension   . Insomnia    ambien  . Squamous carcinoma    "left chest/abd"  . Tubular adenoma 2006  . Weakness of right arm     Past Surgical History:  Procedure Laterality Date  . BASAL CELL CARCINOMA EXCISION     "shoulder"  . BLEPHAROPLASTY Bilateral 2000's  . CARDIAC CATHETERIZATION  2007; 2014  . COLONOSCOPY     pt. states that may have had a polyp  . EYE SURGERY    . JOINT REPLACEMENT    . KNEE ARTHROSCOPY Right X 2  . KNEE ARTHROSCOPY WITH EXCISION BAKER'S CYST Left 03/17/2016   Procedure: LEFT KNEE ARTHROSCOPY WITH DRAINAGE OF BAKER'S CYST;  Surgeon: Frederik Pear, MD;  Location: [place];  Service: Orthopedics;  Laterality: Left;  . KNEE ARTHROSCOPY WITH LATERAL MENISECTOMY Left 03/17/2016   Procedure: LEFT KNEE ARTHROSCOPY WITH LATERAL MENISECTOMY;  Surgeon: Frederik Pear, MD;  Location: [place];  Service: Orthopedics;  Laterality: Left;  . KNEE ARTHROSCOPY WITH MEDIAL MENISECTOMY Left 03/17/2016   Procedure: LEFT KNEE ARTHROSCOPY WITH PARTIAL MEDIAL AND LATERAL  MENISECTOMY AND DEBRIDEMENT CHONDROMALACIA;  Surgeon: Frederik Pear, MD;  Location: [place];  Service: Orthopedics;  Laterality: Left;  . LEFT HEART CATHETERIZATION WITH CORONARY ANGIOGRAM N/A 05/26/2013   Procedure: LEFT HEART CATHETERIZATION WITH CORONARY ANGIOGRAM;  Surgeon: Burnell Blanks, MD;  Location: [facility] CATH LAB;  Service: Cardiovascular;  Laterality: N/A;  . SALIVARY GLAND SURGERY  1999   Dr Thornell Mule  . SQUAMOUS CELL CARCINOMA EXCISION Left    "chest/abd"  . TOTAL HIP ARTHROPLASTY Left 2004  . TOTAL KNEE ARTHROPLASTY Right 04/26/2015   Procedure: RIGHT TOTAL KNEE ARTHROPLASTY;  Surgeon: Frederik Pear, MD;  Location: [place];  Service: Orthopedics;  Laterality: Right;  RIGHT TOTAL KNEE ARTHROPLASTY    There were no vitals filed for this visit.                                    PT Long Term Goals - 07/19/16 1617      PT LONG TERM GOAL #1   Title Pt. I with HEP to increase L LE muscle strength to 5/5 MMT to improve pain-free mobility.     Baseline L LE strength grossly 5/5 MMT except L hip flexion 4+/5 (no pain with resisted L knee ext.).     Time 4   Period Weeks   Status Partially Met  PT LONG TERM GOAL #2   Title Pt. will increase LEFS to >60 out of 80 to improve pain-free mobility/ return to work without limitations.     Baseline LEFS: 30 out of 80 on 07/13/16  (regression in funcitonal outcome score)   Time 4   Period Weeks   Status Not Met     PT LONG TERM GOAL #3   Title Pt. able to participate with 45 gym based/ aquatic ex. program with no increase c/o L knee pain or limitations reported.    Baseline Pt. participating with gym based and aquatic ex. with minimal to no c/o L knee issues.    Time 4   Period Weeks   Status Achieved     PT LONG TERM GOAL #4   Title Pt. able to ambulate 10 min. with normalized gait pattern and consistent step pattern to improve pain-free mobility.     Baseline Slight antalgic gait  (improved).    Time 4   Period Weeks   Status Partially Met     PT LONG TERM GOAL #5   Title Pt. able to ascends/descend 4 steps with recip. pattern and no handrails with no c/o L knee pain to improve return to PLOF.     Baseline Pt. occasionally descends stairs with backwards/step to pattern (pt. feels it is more efficient/ safer).     Time 4   Period Weeks   Status Partially Met     Additional Long Term Goals   Additional Long Term Goals Yes     PT LONG TERM GOAL #6   Title Pt. able to return to work with no L knee pain/limitations safely.    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 Arthritis of knee 04/26/2015  . Primary osteoarthritis of right knee 04/25/2015  . Closed fracture of right patella 04/25/2015  . 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 Posterior tibial tendonitis 08/07/2013  . Precordial pain 05/26/2013  . Essential hypertension 10/19/2009  . Diabetes type 2, controlled ([place]) 10/15/2009  . Hyperlipemia 10/15/2009  . CAD, NATIVE VESSEL 10/15/2009    Pura Spice 10/25/2016, 5:01 PM  [name] [address] [place], [place], [address] Phone: [phone]   Fax:  [phone]  Name: Kyle [name] MRN: [id number] Date of Birth: [birth year]

## 2016-11-01 ENCOUNTER — Ambulatory Visit: Payer: 59 | Attending: Orthopedic Surgery | Admitting: Physical Therapy

## 2016-11-01 DIAGNOSIS — M25662 Stiffness of left knee, not elsewhere classified: Secondary | ICD-10-CM | POA: Diagnosis not present

## 2016-11-01 DIAGNOSIS — G8929 Other chronic pain: Secondary | ICD-10-CM | POA: Insufficient documentation

## 2016-11-01 DIAGNOSIS — M6281 Muscle weakness (generalized): Secondary | ICD-10-CM | POA: Insufficient documentation

## 2016-11-01 DIAGNOSIS — M25562 Pain in left knee: Secondary | ICD-10-CM | POA: Diagnosis not present

## 2016-11-01 DIAGNOSIS — R269 Unspecified abnormalities of gait and mobility: Secondary | ICD-10-CM | POA: Diagnosis not present

## 2016-11-02 NOTE — Therapy (Signed)
[place] [facility] [address] [place], [place], [address] Phone: [phone]   Fax:  [phone]  Physical Therapy Treatment  Patient Details  Name: Kyle [name] MRN: [id number] Date of Birth: Oct 03, 1946 Referring Provider: Dr. Frederik Pear  Encounter Date: 11/01/2016    Past Medical History:  Diagnosis Date  . Allergy   . Arthritis   . Asthma    pt. states very mild  . Basal cell carcinoma    "shoulder"  . Complication of anesthesia    pt. states that neck is arthritic and can not move neck very well.   . Coronary artery disease    non obstructive  . Diabetes mellitus without complication ([place])   . Difficult intubation   . DJD (degenerative joint disease)    "bad" (04/27/2015)  . GERD (gastroesophageal reflux disease)    omeprazole PRN  . Glucose intolerance (pre-diabetes) dx'd 03/2015  . Hypercholesterolemia   . Hypertension   . Insomnia    ambien  . Squamous carcinoma    "left chest/abd"  . Tubular adenoma 2006  . Weakness of right arm     Past Surgical History:  Procedure Laterality Date  . BASAL CELL CARCINOMA EXCISION     "shoulder"  . BLEPHAROPLASTY Bilateral 2000's  . CARDIAC CATHETERIZATION  2007; 2014  . COLONOSCOPY     pt. states that may have had a polyp  . EYE SURGERY    . JOINT REPLACEMENT    . KNEE ARTHROSCOPY Right X 2  . KNEE ARTHROSCOPY WITH EXCISION BAKER'S CYST Left 03/17/2016   Procedure: LEFT KNEE ARTHROSCOPY WITH DRAINAGE OF BAKER'S CYST;  Surgeon: Frederik Pear, MD;  Location: [place];  Service: Orthopedics;  Laterality: Left;  . KNEE ARTHROSCOPY WITH LATERAL MENISECTOMY Left 03/17/2016   Procedure: LEFT KNEE ARTHROSCOPY WITH LATERAL MENISECTOMY;  Surgeon: Frederik Pear, MD;  Location: [place];  Service: Orthopedics;  Laterality: Left;  . KNEE ARTHROSCOPY WITH MEDIAL MENISECTOMY Left 03/17/2016   Procedure: LEFT KNEE ARTHROSCOPY WITH PARTIAL MEDIAL AND LATERAL  MENISECTOMY AND DEBRIDEMENT CHONDROMALACIA;  Surgeon: Frederik Pear, MD;  Location: [place];  Service: Orthopedics;  Laterality: Left;  . LEFT HEART CATHETERIZATION WITH CORONARY ANGIOGRAM N/A 05/26/2013   Procedure: LEFT HEART CATHETERIZATION WITH CORONARY ANGIOGRAM;  Surgeon: Burnell Blanks, MD;  Location: [facility] CATH LAB;  Service: Cardiovascular;  Laterality: N/A;  . SALIVARY GLAND SURGERY  1999   Dr Thornell Mule  . SQUAMOUS CELL CARCINOMA EXCISION Left    "chest/abd"  . TOTAL HIP ARTHROPLASTY Left 2004  . TOTAL KNEE ARTHROPLASTY Right 04/26/2015   Procedure: RIGHT TOTAL KNEE ARTHROPLASTY;  Surgeon: Frederik Pear, MD;  Location: [place];  Service: Orthopedics;  Laterality: Right;  RIGHT TOTAL KNEE ARTHROPLASTY    There were no vitals filed for this visit.                                    PT Long Term Goals - 07/19/16 1617      PT LONG TERM GOAL #1   Title Pt. I with HEP to increase L LE muscle strength to 5/5 MMT to improve pain-free mobility.     Baseline L LE strength grossly 5/5 MMT except L hip flexion 4+/5 (no pain with resisted L knee ext.).     Time 4   Period Weeks   Status Partially Met  PT LONG TERM GOAL #2   Title Pt. will increase LEFS to >60 out of 80 to improve pain-free mobility/ return to work without limitations.     Baseline LEFS: 30 out of 80 on 07/13/16  (regression in funcitonal outcome score)   Time 4   Period Weeks   Status Not Met     PT LONG TERM GOAL #3   Title Pt. able to participate with 45 gym based/ aquatic ex. program with no increase c/o L knee pain or limitations reported.    Baseline Pt. participating with gym based and aquatic ex. with minimal to no c/o L knee issues.    Time 4   Period Weeks   Status Achieved     PT LONG TERM GOAL #4   Title Pt. able to ambulate 10 min. with normalized gait pattern and consistent step pattern to improve pain-free mobility.     Baseline Slight antalgic gait  (improved).    Time 4   Period Weeks   Status Partially Met     PT LONG TERM GOAL #5   Title Pt. able to ascends/descend 4 steps with recip. pattern and no handrails with no c/o L knee pain to improve return to PLOF.     Baseline Pt. occasionally descends stairs with backwards/step to pattern (pt. feels it is more efficient/ safer).     Time 4   Period Weeks   Status Partially Met     Additional Long Term Goals   Additional Long Term Goals Yes     PT LONG TERM GOAL #6   Title Pt. able to return to work with no L knee pain/limitations safely.    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 Arthritis of knee 04/26/2015  . Primary osteoarthritis of right knee 04/25/2015  . Closed fracture of right patella 04/25/2015  . 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 Posterior tibial tendonitis 08/07/2013  . Precordial pain 05/26/2013  . Essential hypertension 10/19/2009  . Diabetes type 2, controlled ([place]) 10/15/2009  . Hyperlipemia 10/15/2009  . CAD, NATIVE VESSEL 10/15/2009    Pura Spice 11/02/2016, 6:24 PM  [place] [facility] [address] [place], [place], [address] Phone: [phone]   Fax:  [phone]  Name: Kyle [name] MRN: [id number] Date of Birth: [birth year]

## 2016-11-06 ENCOUNTER — Ambulatory Visit: Payer: 59 | Admitting: Physical Therapy

## 2016-11-06 ENCOUNTER — Encounter: Payer: 59 | Admitting: Physical Therapy

## 2016-11-06 DIAGNOSIS — M25662 Stiffness of left knee, not elsewhere classified: Secondary | ICD-10-CM | POA: Diagnosis not present

## 2016-11-06 DIAGNOSIS — M25562 Pain in left knee: Secondary | ICD-10-CM | POA: Diagnosis not present

## 2016-11-06 DIAGNOSIS — G8929 Other chronic pain: Secondary | ICD-10-CM

## 2016-11-06 DIAGNOSIS — R269 Unspecified abnormalities of gait and mobility: Secondary | ICD-10-CM

## 2016-11-06 DIAGNOSIS — M6281 Muscle weakness (generalized): Secondary | ICD-10-CM

## 2016-11-07 ENCOUNTER — Encounter: Payer: 59 | Admitting: Physical Therapy

## 2016-11-07 LAB — HM DIABETES EYE EXAM

## 2016-11-07 NOTE — Therapy (Signed)
[place] [facility] [address] [place], [place], [address] Phone: [phone]   Fax:  [phone]  Physical Therapy Treatment  Patient Details  Name: Kyle [name] MRN: [id number] Date of Birth: 10-Jun-1946 Referring Provider: Dr. Frederik Pear  Encounter Date: 11/06/2016    Past Medical History:  Diagnosis Date  . Allergy   . Arthritis   . Asthma    pt. states very mild  . Basal cell carcinoma    "shoulder"  . Complication of anesthesia    pt. states that neck is arthritic and can not move neck very well.   . Coronary artery disease    non obstructive  . Diabetes mellitus without complication ([place])   . Difficult intubation   . DJD (degenerative joint disease)    "bad" (04/27/2015)  . GERD (gastroesophageal reflux disease)    omeprazole PRN  . Glucose intolerance (pre-diabetes) dx'd 03/2015  . Hypercholesterolemia   . Hypertension   . Insomnia    ambien  . Squamous carcinoma    "left chest/abd"  . Tubular adenoma 2006  . Weakness of right arm     Past Surgical History:  Procedure Laterality Date  . BASAL CELL CARCINOMA EXCISION     "shoulder"  . BLEPHAROPLASTY Bilateral 2000's  . CARDIAC CATHETERIZATION  2007; 2014  . COLONOSCOPY     pt. states that may have had a polyp  . EYE SURGERY    . JOINT REPLACEMENT    . KNEE ARTHROSCOPY Right X 2  . KNEE ARTHROSCOPY WITH EXCISION BAKER'S CYST Left 03/17/2016   Procedure: LEFT KNEE ARTHROSCOPY WITH DRAINAGE OF BAKER'S CYST;  Surgeon: Frederik Pear, MD;  Location: [place];  Service: Orthopedics;  Laterality: Left;  . KNEE ARTHROSCOPY WITH LATERAL MENISECTOMY Left 03/17/2016   Procedure: LEFT KNEE ARTHROSCOPY WITH LATERAL MENISECTOMY;  Surgeon: Frederik Pear, MD;  Location: [place];  Service: Orthopedics;  Laterality: Left;  . KNEE ARTHROSCOPY WITH MEDIAL MENISECTOMY Left 03/17/2016   Procedure: LEFT KNEE ARTHROSCOPY WITH PARTIAL MEDIAL AND LATERAL  MENISECTOMY AND DEBRIDEMENT CHONDROMALACIA;  Surgeon: Frederik Pear, MD;  Location: [place];  Service: Orthopedics;  Laterality: Left;  . LEFT HEART CATHETERIZATION WITH CORONARY ANGIOGRAM N/A 05/26/2013   Procedure: LEFT HEART CATHETERIZATION WITH CORONARY ANGIOGRAM;  Surgeon: Burnell Blanks, MD;  Location: [facility] C[facility] LAB;  Service: Cardiovascular;  Laterality: N/A;  . SALIVARY GLAND SURGERY  1999   Dr Thornell Mule  . SQUAMOUS CELL CARCINOMA EXCISION Left    "chest/abd"  . TOTAL HIP ARTHROPLASTY Left 2004  . TOTAL KNEE ARTHROPLASTY Right 04/26/2015   Procedure: RIGHT TOTAL KNEE ARTHROPLASTY;  Surgeon: Frederik Pear, MD;  Location: [place];  Service: Orthopedics;  Laterality: Right;  RIGHT TOTAL KNEE ARTHROPLASTY    There were no vitals filed for this visit.                                    PT Long Term Goals - 07/19/16 1617      PT LONG TERM GOAL #1   Title Pt. I with HEP to increase L LE muscle strength to 5/5 MMT to improve pain-free mobility.     Baseline L LE strength grossly 5/5 MMT except L hip flexion 4+/5 (no pain with resisted L knee ext.).     Time 4   Period Weeks   Status Partially Met  PT LONG TERM GOAL #2   Title Pt. will increase LEFS to >60 out of 80 to improve pain-free mobility/ return to work without limitations.     Baseline LEFS: 30 out of 80 on 07/13/16  (regression in funcitonal outcome score)   Time 4   Period Weeks   Status Not Met     PT LONG TERM GOAL #3   Title Pt. able to participate with 45 gym based/ aquatic ex. program with no increase c/o L knee pain or limitations reported.    Baseline Pt. participating with gym based and aquatic ex. with minimal to no c/o L knee issues.    Time 4   Period Weeks   Status Achieved     PT LONG TERM GOAL #4   Title Pt. able to ambulate 10 min. with normalized gait pattern and consistent step pattern to improve pain-free mobility.     Baseline Slight antalgic gait  (improved).    Time 4   Period Weeks   Status Partially Met     PT LONG TERM GOAL #5   Title Pt. able to ascends/descend 4 steps with recip. pattern and no handrails with no c/o L knee pain to improve return to PLOF.     Baseline Pt. occasionally descends stairs with backwards/step to pattern (pt. feels it is more efficient/ safer).     Time 4   Period Weeks   Status Partially Met     Additional Long Term Goals   Additional Long Term Goals Yes     PT LONG TERM GOAL #6   Title Pt. able to return to work with no L knee pain/limitations safely.    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 Arthritis of knee 04/26/2015  . Primary osteoarthritis of right knee 04/25/2015  . Closed fracture of right patella 04/25/2015  . 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 Posterior tibial tendonitis 08/07/2013  . Precordial pain 05/26/2013  . Essential hypertension 10/19/2009  . Diabetes type 2, controlled ([place]) 10/15/2009  . Hyperlipemia 10/15/2009  . CAD, NATIVE VESSEL 10/15/2009    Pura Spice 11/07/2016, 5:37 PM  [facility] [address]. [place], [place], [address] Phone: [phone]   Fax:  [phone]  Name: Kyle [name] MRN: [id number] Date of Birth: 15-Aug-1946

## 2016-11-13 DIAGNOSIS — M1712 Unilateral primary osteoarthritis, left knee: Secondary | ICD-10-CM | POA: Diagnosis not present

## 2016-11-13 DIAGNOSIS — M25562 Pain in left knee: Secondary | ICD-10-CM | POA: Diagnosis not present

## 2016-11-14 ENCOUNTER — Encounter: Payer: 59 | Admitting: Physical Therapy

## 2016-11-16 ENCOUNTER — Encounter: Payer: 59 | Admitting: Physical Therapy

## 2016-11-20 DIAGNOSIS — D045 Carcinoma in situ of skin of trunk: Secondary | ICD-10-CM | POA: Diagnosis not present

## 2016-11-20 DIAGNOSIS — D485 Neoplasm of uncertain behavior of skin: Secondary | ICD-10-CM | POA: Diagnosis not present

## 2016-11-20 DIAGNOSIS — C44519 Basal cell carcinoma of skin of other part of trunk: Secondary | ICD-10-CM | POA: Diagnosis not present

## 2016-11-21 ENCOUNTER — Encounter: Payer: 59 | Admitting: Physical Therapy

## 2016-11-23 ENCOUNTER — Ambulatory Visit: Payer: 59 | Admitting: Physical Therapy

## 2016-11-23 DIAGNOSIS — G8929 Other chronic pain: Secondary | ICD-10-CM

## 2016-11-23 DIAGNOSIS — R269 Unspecified abnormalities of gait and mobility: Secondary | ICD-10-CM

## 2016-11-23 DIAGNOSIS — M25662 Stiffness of left knee, not elsewhere classified: Secondary | ICD-10-CM | POA: Diagnosis not present

## 2016-11-23 DIAGNOSIS — M6281 Muscle weakness (generalized): Secondary | ICD-10-CM

## 2016-11-23 DIAGNOSIS — M25562 Pain in left knee: Secondary | ICD-10-CM | POA: Diagnosis not present

## 2016-11-24 NOTE — Therapy (Addendum)
[facility] [address]. [place], [place], [address] Phone: [phone]   Fax:  [phone]  Physical Therapy Treatment  Patient Details  Name: Kyle [name] MRN: [id number] Date of Birth: 1946-10-23 Referring Provider: Dr. Frederik Pear  Encounter Date: 11/23/2016    Past Medical History:  Diagnosis Date  . Allergy   . Arthritis   . Asthma    pt. states very mild  . Basal cell carcinoma    "shoulder"  . Complication of anesthesia    pt. states that neck is arthritic and can not move neck very well.   . Coronary artery disease    non obstructive  . Diabetes mellitus without complication ([place])   . Difficult intubation   . DJD (degenerative joint disease)    "bad" (04/27/2015)  . GERD (gastroesophageal reflux disease)    omeprazole PRN  . Glucose intolerance (pre-diabetes) dx'd 03/2015  . Hypercholesterolemia   . Hypertension   . Insomnia    ambien  . Squamous carcinoma    "left chest/abd"  . Tubular adenoma 2006  . Weakness of right arm     Past Surgical History:  Procedure Laterality Date  . BASAL CELL CARCINOMA EXCISION     "shoulder"  . BLEPHAROPLASTY Bilateral 2000's  . CARDIAC CATHETERIZATION  2007; 2014  . COLONOSCOPY     pt. states that may have had a polyp  . EYE SURGERY    . JOINT REPLACEMENT    . KNEE ARTHROSCOPY Right X 2  . KNEE ARTHROSCOPY WITH EXCISION BAKER'S CYST Left 03/17/2016   Procedure: LEFT KNEE ARTHROSCOPY WITH DRAINAGE OF BAKER'S CYST;  Surgeon: Frederik Pear, MD;  Location: [place];  Service: Orthopedics;  Laterality: Left;  . KNEE ARTHROSCOPY WITH LATERAL MENISECTOMY Left 03/17/2016   Procedure: LEFT KNEE ARTHROSCOPY WITH LATERAL MENISECTOMY;  Surgeon: Frederik Pear, MD;  Location: [place];  Service: Orthopedics;  Laterality: Left;  . KNEE ARTHROSCOPY WITH MEDIAL MENISECTOMY Left 03/17/2016   Procedure: LEFT KNEE ARTHROSCOPY WITH PARTIAL MEDIAL AND LATERAL  MENISECTOMY AND DEBRIDEMENT CHONDROMALACIA;  Surgeon: Frederik Pear, MD;  Location: [place];  Service: Orthopedics;  Laterality: Left;  . LEFT HEART CATHETERIZATION WITH CORONARY ANGIOGRAM N/A 05/26/2013   Procedure: LEFT HEART CATHETERIZATION WITH CORONARY ANGIOGRAM;  Surgeon: Burnell Blanks, MD;  Location: [facility] CATH LAB;  Service: Cardiovascular;  Laterality: N/A;  . SALIVARY GLAND SURGERY  1999   Dr Thornell Mule  . SQUAMOUS CELL CARCINOMA EXCISION Left    "chest/abd"  . TOTAL HIP ARTHROPLASTY Left 2004  . TOTAL KNEE ARTHROPLASTY Right 04/26/2015   Procedure: RIGHT TOTAL KNEE ARTHROPLASTY;  Surgeon: Frederik Pear, MD;  Location: [place];  Service: Orthopedics;  Laterality: Right;  RIGHT TOTAL KNEE ARTHROPLASTY    There were no vitals filed for this visit.    Pt. states his knee is not swollen this morning.  Pt. reports always doing better in the morning.  Pt. continues to participate with exercise program/ swimming/ playing golf.     See flowsheet.  Manual tx.: reassessment of patellar/knee mobility.  Good tracking and prox. Tibia AP mobs. (no pain or swelling noted today).  PT instructed pt. In use of compression knee brace with there.ex./ golfing to prevent any localized knee swelling.     Pt. presents with good patellar tracking/ mobility (all planes).  Good distal quad muscle strengthening in B LE with no palpable tenderness/ swelling today.  Pt. understands importance of exercise and  maintaining an active lifestyle but to avoid pain provoking movement patterns.  Pt. benefits from proper shoewear to control ankle/knee stability with everyday/walking tasks.  Pt. recommends progression to more gym based/ HEP at this time and discharge from PT.  Pt. has shown good progress with PT over past several months and is currently at max. physical function.          PT Long Term Goals - 11/24/16 0931      PT LONG TERM GOAL #1   Title Pt. I with HEP to increase L LE muscle strength  to 5/5 MMT to improve pain-free mobility.     Baseline L LE strength grossly 5/5 MMT except L hip flexion 4+/5 (no pain with resisted L knee ext.).     Time 4   Period Weeks   Status Partially Met     PT LONG TERM GOAL #2   Title Pt. will increase LEFS to >60 out of 80 to improve pain-free mobility/ return to work without limitations.     Baseline LEFS: 52 out of 80 on 11/23/16  (improved but goal not met).     Time 4   Period Weeks   Status Partially Met     PT LONG TERM GOAL #3   Title Pt. able to participate with 45 min. gym based/ aquatic ex. program with no increase c/o L knee pain or limitations reported.    Baseline Pt. participating with gym based and aquatic ex. with minimal to no c/o L knee issues.    Time 4   Period Weeks   Status Achieved     PT LONG TERM GOAL #4   Title Pt. able to ambulate 10 min. with normalized gait pattern and consistent step pattern to improve pain-free mobility.     Baseline with proper shoewear   Time 4   Period Weeks   Status Achieved     PT LONG TERM GOAL #5   Title Pt. able to ascends/descend 4 steps with recip. pattern and no handrails with no c/o L knee pain to improve return to PLOF.     Baseline Pt. occasionally descends stairs with backwards/step to pattern (pt. feels it is more efficient/ safer).     Time 4   Period Weeks   Status Partially Met     PT LONG TERM GOAL #6   Title Pt. able to return to work with no L knee pain/limitations safely.    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 Arthritis of knee 04/26/2015  . Primary osteoarthritis of right knee 04/25/2015  . Closed fracture of right patella 04/25/2015  . 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 Posterior tibial tendonitis 08/07/2013  . Precordial pain 05/26/2013  . Essential hypertension 10/19/2009  . Diabetes type 2, controlled ([place]) 10/15/2009  . Hyperlipemia 10/15/2009  . CAD, NATIVE VESSEL 10/15/2009   Pura Spice, PT, DPT # [phone] 11/23/16, 11:15AM  [facility]  [address] [place], [place], [address] Phone: [phone]   Fax:  [phone]  Name: NETANEL YANNUZZI MRN: [id number] Date of Birth: [birth year]

## 2016-11-28 DIAGNOSIS — M25551 Pain in right hip: Secondary | ICD-10-CM | POA: Diagnosis not present

## 2016-11-28 DIAGNOSIS — M25552 Pain in left hip: Secondary | ICD-10-CM | POA: Diagnosis not present

## 2016-11-30 ENCOUNTER — Encounter: Payer: Self-pay | Admitting: Physical Therapy

## 2016-12-25 DIAGNOSIS — Z6825 Body mass index (BMI) 25.0-25.9, adult: Secondary | ICD-10-CM | POA: Diagnosis not present

## 2016-12-25 DIAGNOSIS — M170 Bilateral primary osteoarthritis of knee: Secondary | ICD-10-CM | POA: Diagnosis not present

## 2016-12-25 DIAGNOSIS — M25461 Effusion, right knee: Secondary | ICD-10-CM | POA: Diagnosis not present

## 2016-12-25 DIAGNOSIS — M1712 Unilateral primary osteoarthritis, left knee: Secondary | ICD-10-CM | POA: Diagnosis not present

## 2016-12-25 DIAGNOSIS — Z96651 Presence of right artificial knee joint: Secondary | ICD-10-CM | POA: Diagnosis not present

## 2017-01-01 DIAGNOSIS — L570 Actinic keratosis: Secondary | ICD-10-CM | POA: Diagnosis not present

## 2017-03-03 IMAGING — MR MR LUMBAR SPINE W/O CM
4 of 5 series · 19 of 48 positions shown · non-contrast
Comparison: None.

CLINICAL DATA: Low back pain. RIGHT buttock pain. Recent facet
injection. Stretching injury 3-4 weeks ago.

EXAM:
MRI LUMBAR SPINE WITHOUT CONTRAST
TECHNIQUE: Multiplanar, multisequence MR imaging of the lumbar spine was
performed. No intravenous contrast was administered.

[Series 3: T1 · sagittal · 4.0mm · 0.51mm/px · 3 of 12 slices shown (1 of 2)]
[im 3/12]
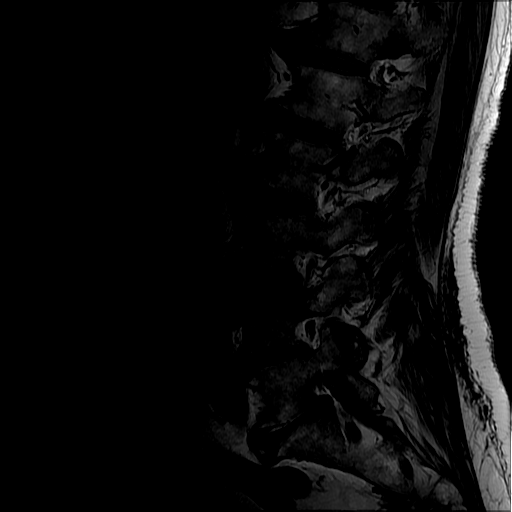
[im 7/12]
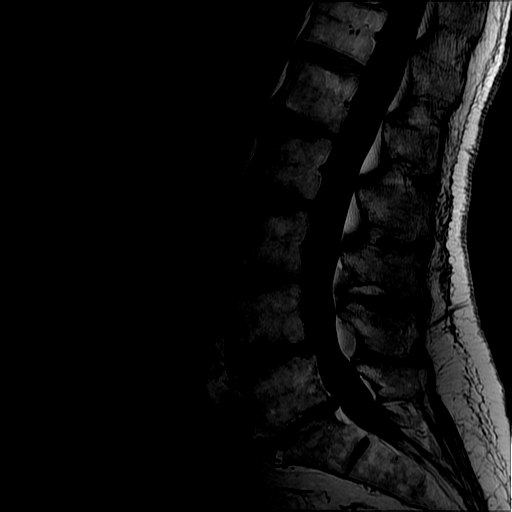
[im 12/12]
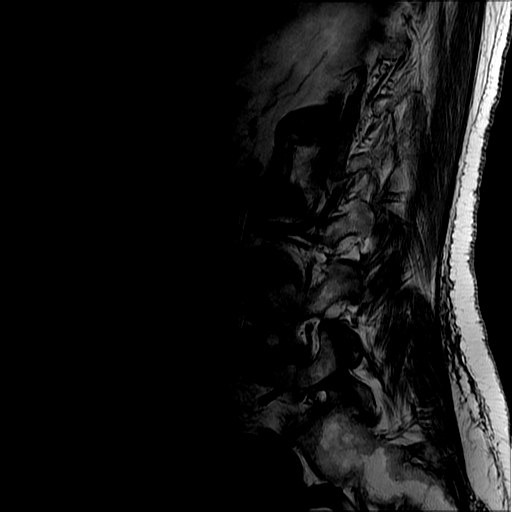

[Series 4: T2 · sagittal · 4.0mm · 0.51mm/px · 5 of 12 slices shown (1 of 2)]
[im 1/12]
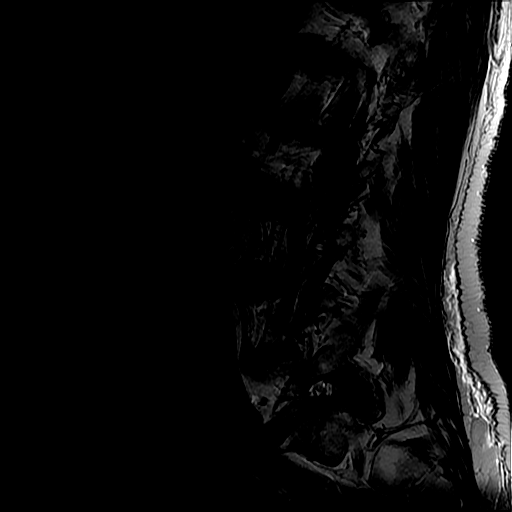
[im 3/12]
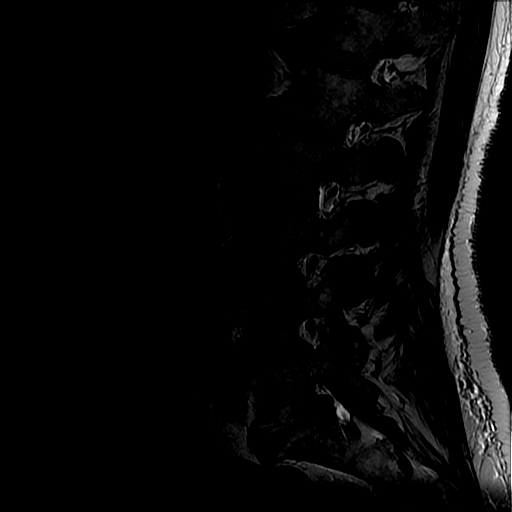
[im 6/12]
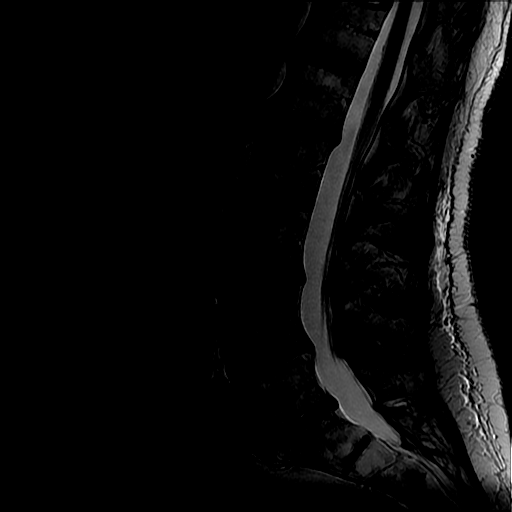
[im 9/12]
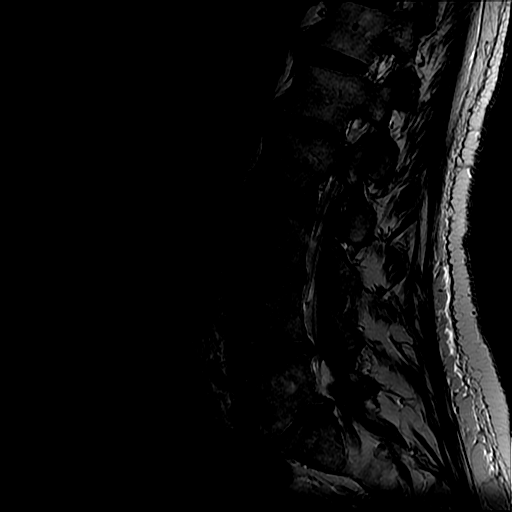
[im 12/12]
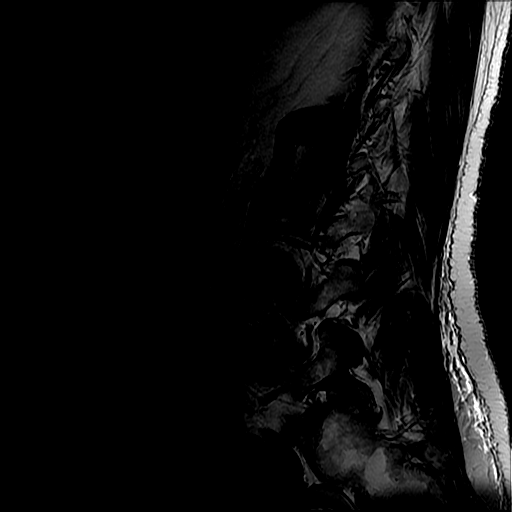

[Series 6: T2 · axial · 4.0mm · 0.39mm/px · z∈[-86,+94]mm · 8 of 37 slices shown (2 of 2)]
[im 3/37]
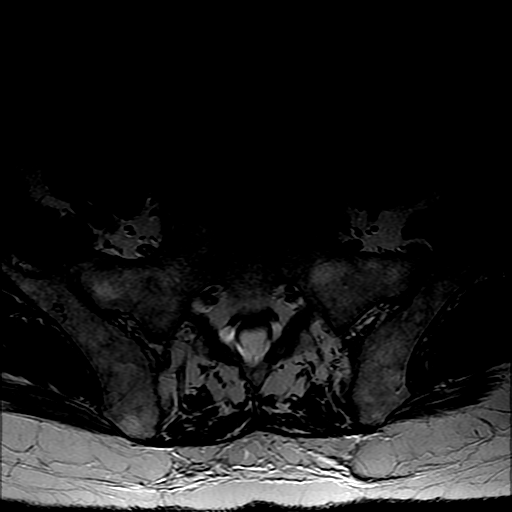
[im 5/37]
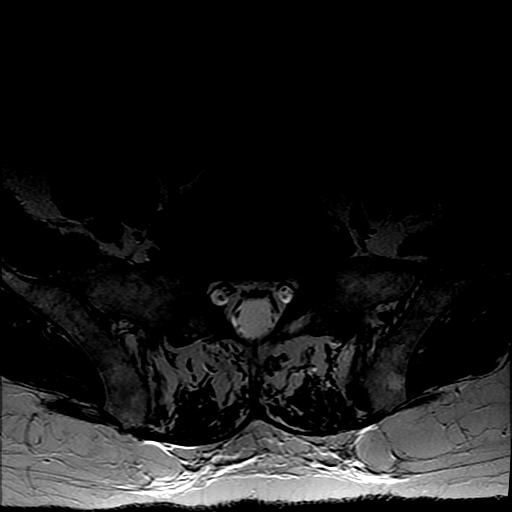
[im 8/37]
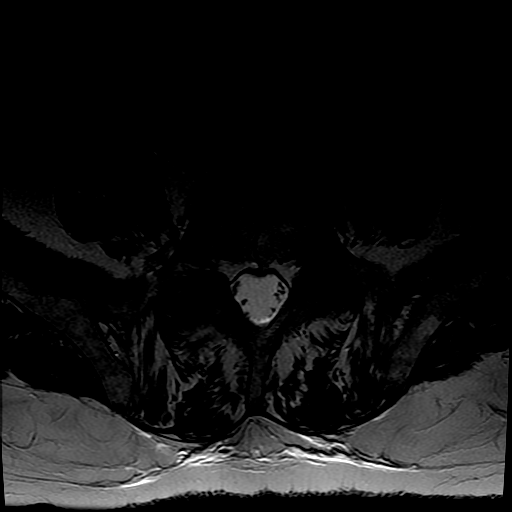
[im 13/37]
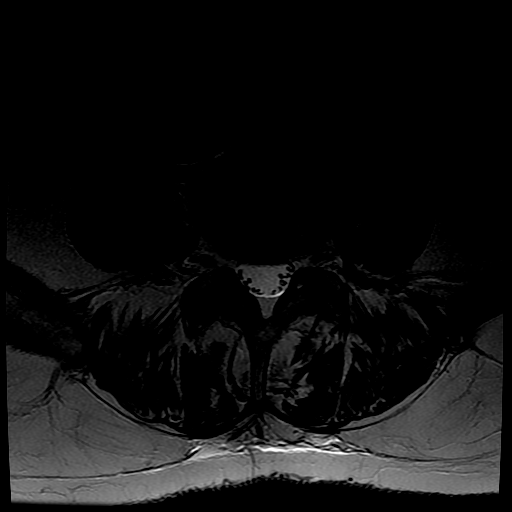
[im 17/37]
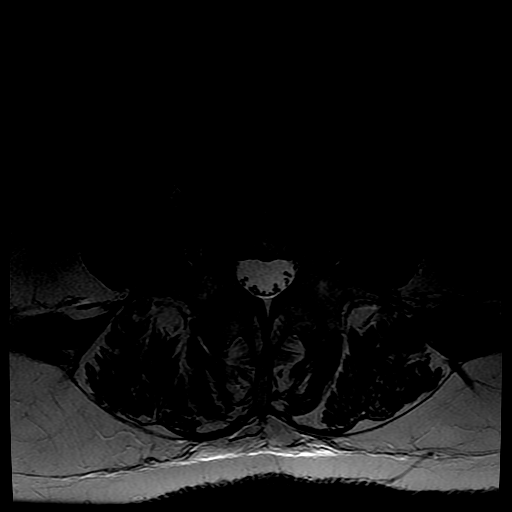
[im 20/37]
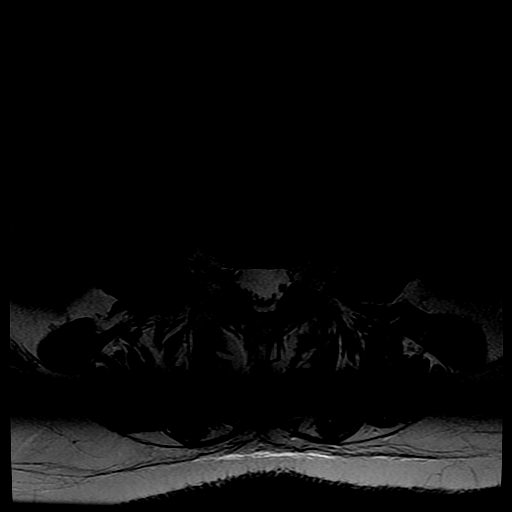
[im 22/37]
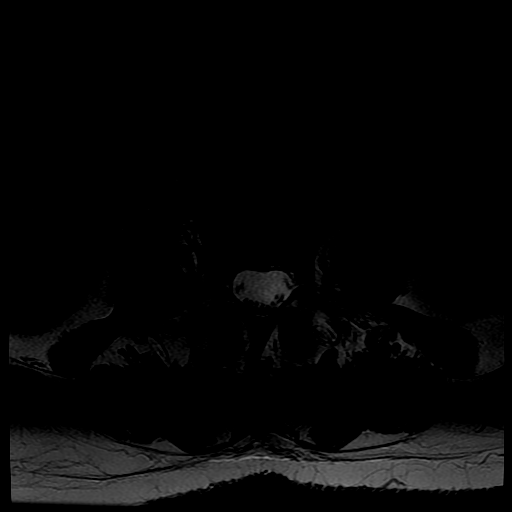
[im 32/37]
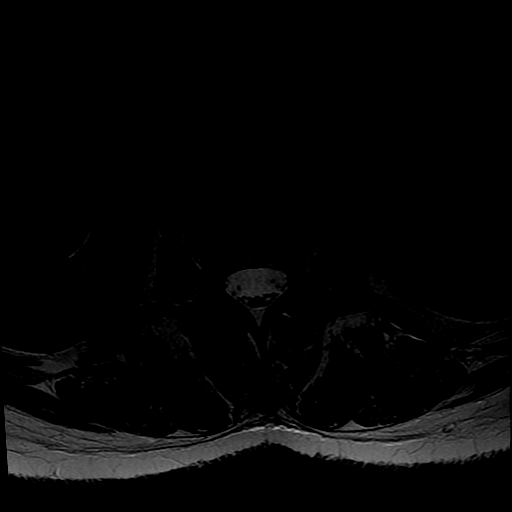

[Series 7: T1 · axial · 4.0mm · 0.39mm/px · z∈[-76,+94]mm · 3 of 37 slices shown (2 of 2)]
[im 5/37]
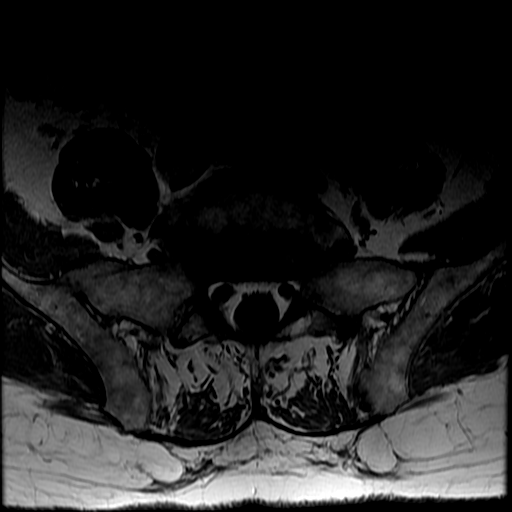
[im 20/37]
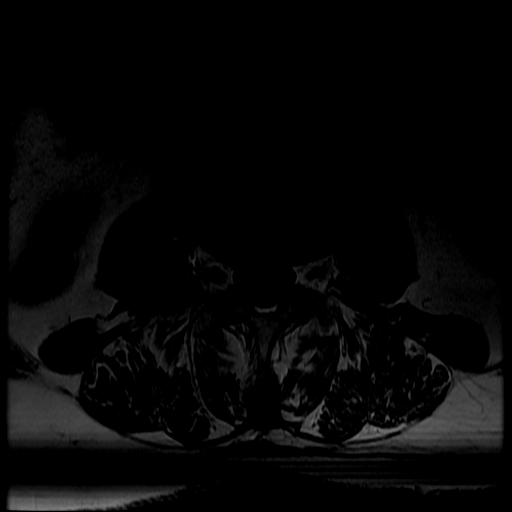
[im 32/37]
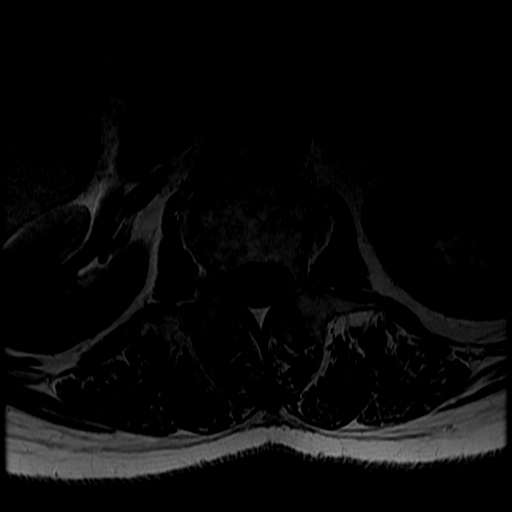

[19 of 48 positions shown; findings below may reference images not displayed]

FINDINGS: Segmentation: Normal.

Alignment:  2 mm anterolisthesis L4-5 is facet mediated.

Vertebrae:    No worrisome osseous lesion

Conus medullaris: Normal in size, signal, and location.

Paraspinal tissues: No evidence for hydronephrosis or paravertebral
mass.

Disc levels:

L1-L2:  Shallow protrusion.  No impingement.

L2-L3:  Normal disc space.  No facet arthropathy or impingement.

L3-L4:  Mild bulge.  Mild facet arthropathy.  No impingement.

L4-L5: 1.5 mm facet mediated slip. Slight RIGHT ligamentum flavum
hypertrophy. No spinal stenosis, subarticular zone, or foraminal
zone narrowing.

L5-S1: Advanced disc space narrowing. Central protrusion/central
disc osteophyte complex. BILATERAL facet arthropathy is worse on the
LEFT. No subarticular zone narrowing, spinal stenosis, or S1 nerve
root impingement. Mild RIGHT sided foraminal zone narrowing does not
clearly affect the L5 nerve root. Moderate LEFT foraminal zone
narrowing due to combination of loss of interspace height, disc
osteophyte complex, and facet arthropathy, could affect the LEFT L5
nerve root.
IMPRESSION: Multilevel spondylosis as described.

Significant right-sided neural impingement is not established.

Advanced disc space narrowing at L5-S1, with LEFT greater than RIGHT
facet arthropathy could contribute to left-sided foraminal narrowing
at this level.

## 2017-04-09 DIAGNOSIS — D485 Neoplasm of uncertain behavior of skin: Secondary | ICD-10-CM | POA: Diagnosis not present

## 2017-10-05 DIAGNOSIS — L570 Actinic keratosis: Secondary | ICD-10-CM | POA: Diagnosis not present

## 2017-11-29 ENCOUNTER — Other Ambulatory Visit (HOSPITAL_COMMUNITY): Payer: Self-pay | Admitting: Orthopedic Surgery

## 2017-11-29 DIAGNOSIS — M25561 Pain in right knee: Secondary | ICD-10-CM

## 2017-12-07 ENCOUNTER — Ambulatory Visit (HOSPITAL_COMMUNITY)
Admission: RE | Admit: 2017-12-07 | Discharge: 2017-12-07 | Disposition: A | Discharge status: Home / Self Care | Payer: Medicare Other | Source: Ambulatory Visit | Attending: Orthopedic Surgery | Admitting: Orthopedic Surgery

## 2017-12-07 ENCOUNTER — Encounter (HOSPITAL_COMMUNITY)
Admission: RE | Admit: 2017-12-07 | Discharge: 2017-12-07 | Disposition: A | Discharge status: Home / Self Care | Payer: Medicare Other | Source: Ambulatory Visit | Attending: Orthopedic Surgery | Admitting: Orthopedic Surgery

## 2017-12-07 DIAGNOSIS — M25561 Pain in right knee: Secondary | ICD-10-CM | POA: Diagnosis not present

## 2017-12-07 DIAGNOSIS — R937 Abnormal findings on diagnostic imaging of other parts of musculoskeletal system: Secondary | ICD-10-CM | POA: Diagnosis not present

## 2017-12-07 MED ORDER — TECHNETIUM TC 99M MEDRONATE IV KIT
22.1000 | PACK | Freq: Once | INTRAVENOUS | Status: AC | PRN
  Administered 2017-12-07: 22.1 via INTRAVENOUS

## 2018-03-26 ENCOUNTER — Encounter: Payer: Self-pay | Admitting: Family Medicine

## 2020-04-06 ENCOUNTER — Telehealth: Payer: Self-pay | Admitting: Neurology

## 2020-04-06 NOTE — Telephone Encounter (Signed)
Okay to see patient without a physician referral.

## 2020-04-06 NOTE — Telephone Encounter (Signed)
Dr. Elder Cyphers called in and is wanting to ref himself to Dr. Jannifer Franklin for memory issues. Patient has been seen in the past by Dr. Erling Cruz. Is it ok to see pt without a referral?
